# Patient Record
Sex: Female | Born: 1960 | Race: White | Hispanic: No | Marital: Married | Smoking: Never smoker
Health system: Southern US, Community
[De-identification: ages and names within clinical notes are randomized; demographics above are authoritative.]

## PROBLEM LIST (undated history)

## (undated) DIAGNOSIS — I471 Supraventricular tachycardia, unspecified: Secondary | ICD-10-CM

## (undated) DIAGNOSIS — I10 Essential (primary) hypertension: Secondary | ICD-10-CM

## (undated) DIAGNOSIS — G43909 Migraine, unspecified, not intractable, without status migrainosus: Secondary | ICD-10-CM

## (undated) DIAGNOSIS — R51 Headache: Secondary | ICD-10-CM

## (undated) DIAGNOSIS — K851 Biliary acute pancreatitis without necrosis or infection: Secondary | ICD-10-CM

## (undated) DIAGNOSIS — N3000 Acute cystitis without hematuria: Secondary | ICD-10-CM

## (undated) DIAGNOSIS — J189 Pneumonia, unspecified organism: Secondary | ICD-10-CM

## (undated) DIAGNOSIS — K219 Gastro-esophageal reflux disease without esophagitis: Secondary | ICD-10-CM

## (undated) DIAGNOSIS — D509 Iron deficiency anemia, unspecified: Secondary | ICD-10-CM

## (undated) HISTORY — PX: OOPHORECTOMY: SHX86

## (undated) HISTORY — DX: Supraventricular tachycardia: I47.1

## (undated) HISTORY — DX: Supraventricular tachycardia, unspecified: I47.10

## (undated) HISTORY — DX: Acute cystitis without hematuria: N30.00

## (undated) HISTORY — DX: Headache: R51

## (undated) HISTORY — DX: Iron deficiency anemia, unspecified: D50.9

## (undated) HISTORY — DX: Biliary acute pancreatitis without necrosis or infection: K85.10

## (undated) HISTORY — PX: BACK SURGERY: SHX140

---

## 1998-05-11 ENCOUNTER — Other Ambulatory Visit: Admission: RE | Admit: 1998-05-11 | Discharge: 1998-05-11 | Payer: Self-pay | Admitting: Obstetrics and Gynecology

## 1999-07-31 ENCOUNTER — Other Ambulatory Visit: Admission: RE | Admit: 1999-07-31 | Discharge: 1999-07-31 | Payer: Self-pay | Admitting: Obstetrics and Gynecology

## 2001-03-04 ENCOUNTER — Emergency Department (HOSPITAL_COMMUNITY): Admission: EM | Admit: 2001-03-04 | Discharge: 2001-03-04 | Payer: Self-pay | Admitting: Emergency Medicine

## 2001-03-19 ENCOUNTER — Other Ambulatory Visit: Admission: RE | Admit: 2001-03-19 | Discharge: 2001-03-19 | Payer: Self-pay | Admitting: Internal Medicine

## 2001-05-18 ENCOUNTER — Encounter: Payer: Self-pay | Admitting: Internal Medicine

## 2005-02-27 ENCOUNTER — Ambulatory Visit: Payer: Self-pay | Admitting: Internal Medicine

## 2005-03-10 ENCOUNTER — Ambulatory Visit: Payer: Self-pay | Admitting: Internal Medicine

## 2006-02-12 ENCOUNTER — Emergency Department (HOSPITAL_COMMUNITY): Admission: EM | Admit: 2006-02-12 | Discharge: 2006-02-12 | Payer: Self-pay | Admitting: Emergency Medicine

## 2006-08-18 HISTORY — PX: ANTERIOR CERVICAL DECOMP/DISCECTOMY FUSION: SHX1161

## 2006-11-13 ENCOUNTER — Ambulatory Visit: Payer: Self-pay | Admitting: Internal Medicine

## 2006-11-13 LAB — CONVERTED CEMR LAB
ALT: 17 units/L (ref 0–40)
Bilirubin, Direct: 0.1 mg/dL (ref 0.0–0.3)
Calcium: 8.3 mg/dL — ABNORMAL LOW (ref 8.4–10.5)
Cholesterol: 175 mg/dL (ref 0–200)
Eosinophils Absolute: 0.2 10*3/uL (ref 0.0–0.6)
Eosinophils Relative: 2.4 % (ref 0.0–5.0)
GFR calc Af Amer: 140 mL/min
GFR calc non Af Amer: 116 mL/min
Glucose, Bld: 86 mg/dL (ref 70–99)
HDL: 46.6 mg/dL (ref >39.0)
Lymphocytes Relative: 21.2 % (ref 12.0–46.0)
MCHC: 33.7 g/dL (ref 30.0–36.0)
MCV: 78.8 fL (ref 78.0–100.0)
Neutro Abs: 7.5 10*3/uL (ref 1.4–7.7)
Platelets: 549 10*3/uL — ABNORMAL HIGH (ref 150–400)
Sodium: 141 meq/L (ref 135–145)
TSH: 1.1 microintl units/mL (ref 0.35–5.50)
Triglycerides: 124 mg/dL (ref 0–149)
WBC: 10.4 10*3/uL (ref 4.5–10.5)

## 2006-11-20 ENCOUNTER — Ambulatory Visit: Payer: Self-pay | Admitting: Internal Medicine

## 2007-02-05 ENCOUNTER — Encounter (INDEPENDENT_AMBULATORY_CARE_PROVIDER_SITE_OTHER): Payer: Self-pay | Admitting: Obstetrics and Gynecology

## 2007-02-05 ENCOUNTER — Ambulatory Visit (HOSPITAL_COMMUNITY): Admission: RE | Admit: 2007-02-05 | Discharge: 2007-02-06 | Payer: Self-pay | Admitting: Obstetrics and Gynecology

## 2007-02-23 ENCOUNTER — Ambulatory Visit: Payer: Self-pay | Admitting: Internal Medicine

## 2007-02-23 LAB — CONVERTED CEMR LAB
Basophils Relative: 0.9 % (ref 0.0–1.0)
Eosinophils Relative: 4.2 % (ref 0.0–5.0)
HCT: 35.4 % — ABNORMAL LOW (ref 36.0–46.0)
Hemoglobin: 12.4 g/dL (ref 12.0–15.0)
Monocytes Absolute: 0.4 10*3/uL (ref 0.2–0.7)
Neutrophils Relative %: 64.7 % (ref 43.0–77.0)
RDW: 13.4 % (ref 11.5–14.6)

## 2007-02-24 ENCOUNTER — Encounter: Payer: Self-pay | Admitting: Internal Medicine

## 2007-03-02 ENCOUNTER — Ambulatory Visit: Payer: Self-pay | Admitting: Internal Medicine

## 2008-01-17 ENCOUNTER — Telehealth: Payer: Self-pay | Admitting: *Deleted

## 2008-05-01 ENCOUNTER — Ambulatory Visit: Payer: Self-pay | Admitting: Internal Medicine

## 2008-05-01 LAB — CONVERTED CEMR LAB
AST: 18 units/L (ref 0–37)
Albumin: 4 g/dL (ref 3.5–5.2)
Basophils Absolute: 0.1 10*3/uL (ref 0.0–0.1)
Basophils Relative: 0.8 % (ref 0.0–3.0)
Bilirubin Urine: NEGATIVE
Blood in Urine, dipstick: NEGATIVE
Calcium: 8.8 mg/dL (ref 8.4–10.5)
Chloride: 109 meq/L (ref 96–112)
Cholesterol: 168 mg/dL (ref 0–200)
Creatinine, Ser: 0.6 mg/dL (ref 0.4–1.2)
Eosinophils Absolute: 0.3 10*3/uL (ref 0.0–0.7)
GFR calc Af Amer: 139 mL/min
GFR calc non Af Amer: 115 mL/min
Glucose, Urine, Semiquant: NEGATIVE
HCT: 38 % (ref 36.0–46.0)
HDL: 35.7 mg/dL — ABNORMAL LOW (ref >39.0)
MCHC: 33.8 g/dL (ref 30.0–36.0)
MCV: 82.7 fL (ref 78.0–100.0)
Monocytes Absolute: 0.4 10*3/uL (ref 0.1–1.0)
Neutrophils Relative %: 66.7 % (ref 43.0–77.0)
Protein, U semiquant: NEGATIVE
RBC: 4.6 M/uL (ref 3.87–5.11)
TSH: 0.78 microintl units/mL (ref 0.35–5.50)
Total Bilirubin: 1 mg/dL (ref 0.3–1.2)
VLDL: 23 mg/dL (ref 0–40)
pH: 7

## 2008-05-08 ENCOUNTER — Ambulatory Visit: Payer: Self-pay | Admitting: Internal Medicine

## 2008-05-08 DIAGNOSIS — D473 Essential (hemorrhagic) thrombocythemia: Secondary | ICD-10-CM

## 2008-05-08 DIAGNOSIS — G478 Other sleep disorders: Secondary | ICD-10-CM | POA: Insufficient documentation

## 2008-05-08 DIAGNOSIS — D75839 Thrombocytosis, unspecified: Secondary | ICD-10-CM | POA: Insufficient documentation

## 2008-05-08 DIAGNOSIS — R5383 Other fatigue: Secondary | ICD-10-CM

## 2008-05-08 DIAGNOSIS — R51 Headache: Secondary | ICD-10-CM | POA: Insufficient documentation

## 2008-05-08 DIAGNOSIS — J45909 Unspecified asthma, uncomplicated: Secondary | ICD-10-CM | POA: Insufficient documentation

## 2008-05-08 DIAGNOSIS — R5381 Other malaise: Secondary | ICD-10-CM | POA: Insufficient documentation

## 2008-05-08 DIAGNOSIS — E669 Obesity, unspecified: Secondary | ICD-10-CM | POA: Insufficient documentation

## 2008-05-08 DIAGNOSIS — R519 Headache, unspecified: Secondary | ICD-10-CM | POA: Insufficient documentation

## 2008-05-13 DIAGNOSIS — J309 Allergic rhinitis, unspecified: Secondary | ICD-10-CM | POA: Insufficient documentation

## 2008-08-18 HISTORY — PX: VAGINAL HYSTERECTOMY: SUR661

## 2008-12-21 ENCOUNTER — Ambulatory Visit: Payer: Self-pay | Admitting: Internal Medicine

## 2009-01-04 ENCOUNTER — Telehealth: Payer: Self-pay | Admitting: Internal Medicine

## 2009-01-24 ENCOUNTER — Ambulatory Visit (HOSPITAL_COMMUNITY): Admission: RE | Admit: 2009-01-24 | Discharge: 2009-01-25 | Payer: Self-pay | Admitting: Orthopaedic Surgery

## 2009-04-27 ENCOUNTER — Ambulatory Visit: Payer: Self-pay | Admitting: Internal Medicine

## 2009-05-17 ENCOUNTER — Ambulatory Visit: Payer: Self-pay | Admitting: Internal Medicine

## 2009-05-17 LAB — CONVERTED CEMR LAB
Bilirubin Urine: NEGATIVE
Blood in Urine, dipstick: NEGATIVE
Glucose, Urine, Semiquant: NEGATIVE
Ketones, urine, test strip: NEGATIVE
Specific Gravity, Urine: 1.02
pH: 5.5

## 2009-05-25 ENCOUNTER — Ambulatory Visit: Payer: Self-pay | Admitting: Internal Medicine

## 2009-05-25 DIAGNOSIS — R300 Dysuria: Secondary | ICD-10-CM | POA: Insufficient documentation

## 2009-05-25 DIAGNOSIS — N3000 Acute cystitis without hematuria: Secondary | ICD-10-CM

## 2009-05-25 HISTORY — DX: Acute cystitis without hematuria: N30.00

## 2009-05-25 LAB — CONVERTED CEMR LAB
ALT: 17 units/L (ref 0–35)
Basophils Absolute: 0.1 10*3/uL (ref 0.0–0.1)
Basophils Relative: 0.5 % (ref 0.0–3.0)
Bilirubin Urine: NEGATIVE
Bilirubin, Direct: 0.1 mg/dL (ref 0.0–0.3)
CO2: 30 meq/L (ref 19–32)
Calcium: 8.5 mg/dL (ref 8.4–10.5)
Chloride: 103 meq/L (ref 96–112)
Cholesterol: 163 mg/dL (ref 0–200)
Creatinine, Ser: 0.6 mg/dL (ref 0.4–1.2)
Eosinophils Absolute: 0.4 10*3/uL (ref 0.0–0.7)
Glucose, Bld: 93 mg/dL (ref 70–99)
Glucose, Urine, Semiquant: NEGATIVE
HDL: 34.8 mg/dL — ABNORMAL LOW (ref >39.00)
Lymphocytes Relative: 19.9 % (ref 12.0–46.0)
MCHC: 34.5 g/dL (ref 30.0–36.0)
MCV: 81.6 fL (ref 78.0–100.0)
Monocytes Absolute: 0.5 10*3/uL (ref 0.1–1.0)
Neutro Abs: 7.3 10*3/uL (ref 1.4–7.7)
Neutrophils Relative %: 70.7 % (ref 43.0–77.0)
RBC: 4.64 M/uL (ref 3.87–5.11)
RDW: 13.5 % (ref 11.5–14.6)
Specific Gravity, Urine: >=1.03
Total Protein: 7.1 g/dL (ref 6.0–8.3)
Triglycerides: 145 mg/dL (ref 0.0–149.0)
pH: 5.5

## 2009-05-26 ENCOUNTER — Encounter: Payer: Self-pay | Admitting: Internal Medicine

## 2009-06-11 ENCOUNTER — Encounter: Payer: Self-pay | Admitting: *Deleted

## 2009-08-31 ENCOUNTER — Telehealth: Payer: Self-pay | Admitting: *Deleted

## 2010-09-13 ENCOUNTER — Encounter: Payer: Self-pay | Admitting: *Deleted

## 2010-09-17 NOTE — Progress Notes (Signed)
Summary: refill on rx sent to [facility]  Phone Note From Pharmacy   Caller: [facility] Reason for Call: Needs renewal Details for Reason: metoprolol, singulair and advair Initial call taken by: Romualdo Bolk, CMA (AAMA),  August 31, 2009 4:00 PM  Follow-up for Phone Call        Pt has never used [facility] before only [facility]. I tried to call pt to verify that she wants [place] to send her rx's to [facility] but her home number was a fax.  Follow-up by: Romualdo Bolk, CMA Duncan Dull),  August 31, 2009 4:03 PM  Additional Follow-up for Phone Call Additional follow up Details #1::        LMTOCB Additional Follow-up by: Romualdo Bolk, CMA Duncan Dull),  September 05, 2009 10:49 AM    Additional Follow-up for Phone Call Additional follow up Details #2::    Pt called back saying that we could go ahead and sent rx's to [facility]. Rx's sent. Follow-up by: Romualdo Bolk, CMA (AAMA),  September 07, 2009 10:13 AM  Prescriptions: ADVAIR DISKUS 100-50 MCG/DOSE  MISC (FLUTICASONE-SALMETEROL) 1 puff two times a day or as directed  #3 x 3   Entered by:   Romualdo Bolk, CMA (AAMA)   Authorized by:   Madelin Headings MD   Signed by:   Romualdo Bolk, CMA (AAMA) on 09/07/2009   Method used:   Electronically to        [facility] (mail-order)             ,          Ph: [phone]       Fax: [phone]   RxID:   [id number] TOPROL XL 50 MG  TB24 (METOPROLOL SUCCINATE) 1 by mouth once daily.  #90 x 3   Entered by:   Romualdo Bolk, CMA (AAMA)   Authorized by:   Madelin Headings MD   Signed by:   Romualdo Bolk, CMA (AAMA) on 09/07/2009   Method used:   Electronically to        [facility] (mail-order)             ,          Ph: [phone]       Fax: [phone]   RxID:   [id number] SINGULAIR 10 MG  TABS (MONTELUKAST SODIUM) 1 by mouth once daily.  #90 x 3   Entered by:   Romualdo Bolk, CMA (AAMA)   Authorized by:   Madelin Headings MD   Signed by:   Romualdo Bolk, CMA  (AAMA) on 09/07/2009   Method used:   Electronically to        [facility]* (mail-order)             ,          Ph: [phone]       Fax: [phone]   RxID:   [id number]

## 2010-09-19 NOTE — Letter (Signed)
Summary: Generic Letter  [place] [facility]  [address] [place], [place] [address]   Phone: [phone]  Fax: [phone]    09/13/2010  Danielle Martin [address] [place], [place]  [address]  Dear [name],  We recieved refill request for your medication. In order to insure proper medical care, you are due for a physical appointment. Please give our office a call at [phone] to schedule this appointment before your next refill.          Sincerely,   Tor Netters, CMA (AAMA)

## 2010-11-07 ENCOUNTER — Other Ambulatory Visit: Payer: Self-pay | Admitting: Internal Medicine

## 2010-11-25 LAB — DIFFERENTIAL
Eosinophils Absolute: 0.2 10*3/uL (ref 0.0–0.7)
Lymphs Abs: 2.1 10*3/uL (ref 0.7–4.0)
Monocytes Absolute: 0.5 10*3/uL (ref 0.1–1.0)
Monocytes Relative: 5 % (ref 3–12)
Neutrophils Relative %: 67 % (ref 43–77)

## 2010-11-25 LAB — URINALYSIS, ROUTINE W REFLEX MICROSCOPIC
Hgb urine dipstick: NEGATIVE
Nitrite: NEGATIVE
Specific Gravity, Urine: 1.022 (ref 1.005–1.030)
Urobilinogen, UA: 0.2 mg/dL (ref 0.0–1.0)
pH: 5.5 (ref 5.0–8.0)

## 2010-11-25 LAB — BASIC METABOLIC PANEL
Calcium: 8.9 mg/dL (ref 8.4–10.5)
Creatinine, Ser: 0.59 mg/dL (ref 0.4–1.2)
GFR calc Af Amer: >60 mL/min (ref >60)
GFR calc non Af Amer: >60 mL/min (ref >60)
Sodium: 141 mEq/L (ref 135–145)

## 2010-11-25 LAB — URINE MICROSCOPIC-ADD ON

## 2010-11-25 LAB — CBC
HCT: 41.9 % (ref 36.0–46.0)
Hemoglobin: 13.1 g/dL (ref 12.0–15.0)
Hemoglobin: 14 g/dL (ref 12.0–15.0)
MCHC: 33.4 g/dL (ref 30.0–36.0)
MCHC: 34.6 g/dL (ref 30.0–36.0)
MCV: 82.2 fL (ref 78.0–100.0)
RBC: 4.58 MIL/uL (ref 3.87–5.11)
RBC: 5.09 MIL/uL (ref 3.87–5.11)
WBC: 7.4 10*3/uL (ref 4.0–10.5)
WBC: 8.7 10*3/uL (ref 4.0–10.5)

## 2010-11-25 LAB — PROTIME-INR: INR: 1 (ref 0.00–1.49)

## 2010-12-09 ENCOUNTER — Other Ambulatory Visit: Payer: Self-pay | Admitting: Internal Medicine

## 2010-12-18 ENCOUNTER — Other Ambulatory Visit (INDEPENDENT_AMBULATORY_CARE_PROVIDER_SITE_OTHER): Payer: BC Managed Care – PPO | Admitting: Internal Medicine

## 2010-12-18 DIAGNOSIS — Z0000 Encounter for general adult medical examination without abnormal findings: Secondary | ICD-10-CM

## 2010-12-18 LAB — TSH: TSH: 0.83 u[IU]/mL (ref 0.35–5.50)

## 2010-12-18 LAB — BASIC METABOLIC PANEL
CO2: 31 mEq/L (ref 19–32)
Calcium: 8.9 mg/dL (ref 8.4–10.5)
Creatinine, Ser: 0.7 mg/dL (ref 0.4–1.2)
Glucose, Bld: 91 mg/dL (ref 70–99)

## 2010-12-18 LAB — POCT URINALYSIS DIPSTICK
Bilirubin, UA: NEGATIVE
Blood, UA: NEGATIVE
Glucose, UA: NEGATIVE
Nitrite, UA: NEGATIVE
Spec Grav, UA: 1.02
Urobilinogen, UA: 0.2

## 2010-12-18 LAB — LIPID PANEL
Cholesterol: 183 mg/dL (ref 0–200)
LDL Cholesterol: 113 mg/dL — ABNORMAL HIGH (ref 0–99)
Triglycerides: 152 mg/dL — ABNORMAL HIGH (ref 0.0–149.0)

## 2010-12-18 LAB — HEPATIC FUNCTION PANEL
ALT: 18 U/L (ref 0–35)
AST: 17 U/L (ref 0–37)
Total Protein: 7 g/dL (ref 6.0–8.3)

## 2010-12-18 LAB — CBC WITH DIFFERENTIAL/PLATELET
Basophils Relative: 0.6 % (ref 0.0–3.0)
Eosinophils Relative: 3 % (ref 0.0–5.0)
Lymphs Abs: 1.7 10*3/uL (ref 0.7–4.0)
MCHC: 33.4 g/dL (ref 30.0–36.0)
Monocytes Absolute: 0.4 10*3/uL (ref 0.1–1.0)
Monocytes Relative: 5.3 % (ref 3.0–12.0)
Neutro Abs: 5.7 10*3/uL (ref 1.4–7.7)
RDW: 14.4 % (ref 11.5–14.6)

## 2010-12-24 ENCOUNTER — Encounter: Payer: Self-pay | Admitting: Internal Medicine

## 2010-12-25 ENCOUNTER — Encounter: Payer: Self-pay | Admitting: Internal Medicine

## 2010-12-25 ENCOUNTER — Ambulatory Visit (INDEPENDENT_AMBULATORY_CARE_PROVIDER_SITE_OTHER): Payer: BC Managed Care – PPO | Admitting: Internal Medicine

## 2010-12-25 VITALS — BP 140/80 | HR 72 | Ht 64.0 in | Wt 227.0 lb

## 2010-12-25 DIAGNOSIS — E669 Obesity, unspecified: Secondary | ICD-10-CM

## 2010-12-25 DIAGNOSIS — D759 Disease of blood and blood-forming organs, unspecified: Secondary | ICD-10-CM

## 2010-12-25 DIAGNOSIS — I10 Essential (primary) hypertension: Secondary | ICD-10-CM

## 2010-12-25 DIAGNOSIS — J45909 Unspecified asthma, uncomplicated: Secondary | ICD-10-CM

## 2010-12-25 DIAGNOSIS — Z23 Encounter for immunization: Secondary | ICD-10-CM

## 2010-12-25 DIAGNOSIS — G478 Other sleep disorders: Secondary | ICD-10-CM

## 2010-12-25 DIAGNOSIS — Z0000 Encounter for general adult medical examination without abnormal findings: Secondary | ICD-10-CM

## 2010-12-25 DIAGNOSIS — J309 Allergic rhinitis, unspecified: Secondary | ICD-10-CM

## 2010-12-25 LAB — HM MAMMOGRAPHY

## 2010-12-25 MED ORDER — METOPROLOL SUCCINATE ER 50 MG PO TB24: 50.0000 mg | ORAL_TABLET | Freq: Every day | ORAL | Status: DC

## 2010-12-25 NOTE — Progress Notes (Signed)
Subjective:    Patient ID: Danielle Martin, female    DOB: 02-Nov-1960, 50 y.o.   MRN: [id number]  HPI Patient comes in today for preventive visit and followup of her medical issues.  No major change in health status since last visit .  Allergy asthma no flares except after mowing grass.  Problems falling asleep and hard to go back. Began ret exercise  12 miles qod   And elliptical.  She is to see her OB/GYN this week for a Pap smear and mammogram Review of Systems Sleep 5-6 hours  Of sleep.  No OSA sx   no restless leg Knee bothers her at times . Tried glucosamine/chondroitin and made it worse so she stopped Asthma allergy stable on meds. New bleeding major change in vision hearing chest pain shortness of breath other changes in health. Rest as per history of present illness.   Past Medical History  Diagnosis Date  . Asthma     Worse in winter  . Allergic rhinitis     Worse in winter , grasses  . Headache   . SVT (supraventricular tachycardia)     No longer a problem   Past Surgical History  Procedure Date  . Cesarean section     x 2  . Oophorectomy     right  . Cervical spine surgery     reports that she has never smoked. She does not have any smokeless tobacco history on file. She reports that she drinks alcohol. Her drug history not on file. family history includes Other in her mother and Prostate cancer in her father. Allergies  Allergen Reactions  . Codeine Anaphylaxis    Hallucinations       Objective:   Physical Exam Physical Exam: Vital signs reviewed BJY:NWGN is a well-developed well-nourished alert cooperative  white female who appears her stated age in no acute distress.  HEENT: normocephalic  traumatic , Eyes: PERRL EOM's full, conjunctiva clear, wears glasses Nares: paten,t no deformity discharge or tenderness., Ears: no deformity EAC's clear TMs with normal landmarks. Mouth: clear OP, no lesions, edema.  Moist mucous membranes. Dentition in adequate  repair. NECK: supple without masses, thyromegaly or bruits. CHEST/PULM:  Clear to auscultation and percussion breath sounds equal no wheeze , rales or rhonchi. No chest wall deformities or tenderness. Breast: normal by inspection . No dimpling, discharge, masses, tenderness or discharge . CV: PMI is nondisplaced, S1 S2 no gallops, murmurs, rubs. Peripheral pulses are full without delay.No JVD .  Repeat blood pressure was 138/82 right arm large cuff ABDOMEN: Bowel sounds normal nontender  No guard or rebound, no hepato splenomegal no CVA tenderness.  No hernia. Extremtities:  No clubbing cyanosis or edema, no acute joint swelling or redness no focal atrophy NEURO:  Oriented x3, cranial nerves 3-12 appear to be intact, no obvious focal weakness,gait within normal limits no abnormal reflexes or asymmetrical SKIN: No acute rashes normal turgor, color, no bruising or petechiae. PSYCH: Oriented, good eye contact, no obvious depression anxiety, cognition and judgment appear normal. Laboratory studies reviewed slightly elevated potassium at 5.2 normal renal function platelet count is normal rest of labs normal EKG shows normal sinus rhythm with nonspecific T wave change within normal limits       Assessment & Plan:  Preventive Health Care UTD   TDAP today  To get [pap and mammo .  Potassium of 5.2 is probably lab drawing affect .Counseled regarding healthy nutrition, exercise, sleep, injury prevention, calcium vit d and healthy  weight . HT: Him reasonably adequate control. Discussed other options but since she is doing well and no significant side effects will remain on her Toprol-XL for the next year. She will call us if control is a factor but improvement in lifestyle interventions may be more helpful. Obesity Thrombocytosis   resolved on the CBC no history of clotting Allergy asthma  stable take Singulair year round. Sleep: Stable discussed  techniques interventions at present will continue. No  evidence of disease

## 2010-12-25 NOTE — Patient Instructions (Addendum)
Continue lifestyle intervention healthy eating and exercise .  Weight loss will help you medical issues. Consider calendaring  Intake and exercise  Check up and labs in a year. conctact [place] if BP readings not in control

## 2010-12-30 ENCOUNTER — Other Ambulatory Visit: Payer: Self-pay | Admitting: Internal Medicine

## 2010-12-31 NOTE — Op Note (Signed)
NAMELYLIA, KARN NO.:  [id number]   MEDICAL RECORD NO.:  [id number]          PATIENT TYPE:  OIB   LOCATION:  9302                          FACILITY:  [facility]   PHYSICIAN:  Juluis Mire, M.D.   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   OPERATIVE PROCEDURE:  Open laparoscopy with laparoscopic assisted  vaginal hysterectomy.  Lysis of adhesions.  Cystoscopy.   SURGEON:  Juluis Mire, M.D.   ASSISTANT:  Zelphia Cairo, M.D.   ESTIMATED BLOOD LOSS:  400-500 mL.   PACKS AND DRAINS:  None.   BLOOD REPLACED:  None.   COMPLICATIONS:  None.   INDICATIONS:  Dictated history and physical.   DESCRIPTION OF PROCEDURE:  The patient was taken to OR, placed supine  position.  After satisfactory level of general endotracheal anesthesia  obtained, the patient was placed in dorsal supine position using the  Allen stirrups.  At this point time the abdomen, perineum, vagina  prepped out Betadine.  The bladder was emptied by in-and-out  catheterization.  A Hulka tenaculum was put in place and secured.  Patient draped sterile field.  Subumbilical incision made knife carried  through subcutaneous tissue.  Fascia was entered sharply and incision to  fascia extended laterally.  Muscles were separated midline.  Peritoneum  was picked up and entered sharply.  A open laparoscopic trocar was put  in place and secured.  Laparoscope was introduced and abdomen  insufflated with carbon dioxide.  There was no evidence of injury to  adjacent organs.  The appendix was visualized noted be normal. Upper  abdomen including liver and tip the gallbladder were clear. In the  pelvis the right ovary was surgically absent.  The left ovary  was  adherent to the left pelvic sidewall and to the back the uterus.  A 5-mm  trocar was put in place in suprapubic area.  Were able to dissect the  ovary off the back of uterus.  Then we brought the gyrus in place and  next the left round ligament was cauterized incised. Left tube and  mesosalpinx were cauterized incised and the left utero-ovarian pedicle  was cauterized and incised.  We were then went to the right side where  basically we just had the round ligament and this was cauterized and  incised.  We continued cautery and incision down to the broad ligament  next the side of the uterus to better facilitate vaginal removal. At  this point time we had fairly good hemostasis.  The abdomen deflated  carbon dioxide.  Laparoscope was removed.   The Hulka tenaculum was then removed.  The patient's legs repositioned.  Weighted speculum placed in vaginal vault.  Cervix was grasped with  Christella Hartigan tenaculum.  Cul-de-sac was entered sharply.  Both uterosacral  ligaments were clamped, cut, suture ligated with 0 Vicryl.  Reflection  of vaginal mucosa was incised using the Bovie and bladder was dissected  superiorly.  Paracervical tissue was clamped, cut and suture ligated 0  Vicryl.  Vesicouterine space identified and entered sharply.  Retractors  put in place to retract the bladder superiorly. Using the clamp, cut and  tie technique with suture ligature of 0 Vicryl.  The parametrium was  serially separated from sides the uterus.  Uterus was then flipped.  There was no additional pedicles remaining.  We did have some oozing  from the vaginal cuff brought under control with figure-of-eights of 0  Vicryl.  At this point time she had such excellent support we did not do  uterosacral plication stitch.  The vaginal mucosa was reapproximated in  vertical fashion with interrupted figure-of-eights of 0 Monocryl.  We  actually had some vaginal lacerations from retracting and we sutured  over these with  the 0 Monocryl and used the Bovie for hemostasis.   Due to the adhesions of the left ovary, we decided performed cystoscopy  to rule out ureteric injury.  The patient was given indigo carmine,  cystoscope was brought in place.  The scope was inserted in the bladder  and the bladder was distended using irrigant.  Visualization revealed  both ureteral orifices were identified. Actually even before the balloon  could come through, we could see urine spilling from both ureteral  orifices in abundance.  There is no evidence of bladder lacerations.  The scope was then removed, Foley was placed straight drain.  We  visualized the vaginal cuff and it was hemostatically intact.   The patient's legs repositioned.  Abdomen reinflated carbon dioxide.  Laparoscope was reintroduced and visualization revealed some bleeding  from the vaginal cuff. We put in the second 5-mm trocar in the left  lower quadrant after visualization of the epigastric vessels.  This was  used to bring the bowel out of the pelvic cavity.  We then used the  gyrus to bring about hemostasis at the vaginal cuff.  We had good  hemostasis.  We thoroughly irrigated the pelvis and there was no active  bleeding.  We deflated, we visualized and no active bleeding was noted.  At this point time the abdomen was deflated carbon dioxide.  All trocars  removed.  Subumbilical fascia closed figure-of-eights of 0 Vicryl.  Skin  with interrupted subcuticulars of 4-0 Vicryl.  The suprapubic incisions  were closed with Dermabond.  The patient taken out of dorsal lithotomy  position.  Once alert, extubated, transferred recovery room good  condition.  Sponge, instrument, needle count was correct by circulating  nurse x2.  Foley catheter remained clear at time of closure.  The  patient tolerated procedure well and was returned to recovery room in  good position.      Juluis Mire, M.D.  Electronically Signed     JSM/MEDQ  D:  02/05/2007  T:   02/05/2007  Job:  [id number]

## 2010-12-31 NOTE — H&P (Signed)
NAME:  Danielle Martin, Danielle Martin NO.:  [id number]   MEDICAL RECORD NO.:  [id number]          PATIENT TYPE:  AMB   LOCATION:  SDC                           [facility]ACILITY:  [facility]   PHYSICIAN:  Juluis Mire, M.D.   DATE OF BIRTH:  [birth year]   DATE OF ADMISSION:  02/05/2007  DATE OF DISCHARGE:                              HISTORY & PHYSICAL   The patient is a 45-year gravida 2, para 2 female who presents for  laparoscopic-assisted vaginal hysterectomy.   In relation to the present admission, the patient has been followed by  Dr. Fabian Sharp for routine health maintenance.  She has noted that her  cycles have become increasingly heavy and was referred in for abnormal  bleeding.  Right now, her periods have been lasting for approximately 3  weeks.  She has severe cramping with large clots and heavy flow.  Her  hemoglobin has remained stable.  Saline infusion ultrasound was highly  suggestive of adenomyosis with no other abnormalities noted.  We  discussed options including birth control pills versus IUD versus  ablation versus hysterectomy.  The patient was admitted at the present  time to undergo laparoscopic-assisted vaginal hysterectomy.  All Pap  smears through Dr. Fabian Sharp have been normal.  Husband has had prior  vasectomy.   In terms of allergies, she has no known drug allergies.   MEDICATIONS:  Are none.   PAST MEDICAL HISTORY:  Usual childhood disease.  No significant  sequelae.   PAST SURGICAL HISTORY:  She had in '89 a left hysterectomy and a partial  right hysterectomy due to cysts.  This was in [place].  She has had two  cesarean sections.   FAMILY HISTORY:  Noncontributory.   SOCIAL HISTORY:  Reveals no tobacco or alcohol use.   REVIEW OF SYSTEMS:  Noncontributory.   PHYSICAL EXAMINATION:  The patient is afebrile, stable vital signs.  HEENT EXAM:  The patient is normocephalic.  Pupils equal, round and  reactive to light and accommodation.  Extraocular movements  intact.  Sclerae and conjunctivae clear.  Oropharynx clear.  NECK:  Without thyromegaly.  BREASTS:  Not examined.  LUNGS:  Clear.  CARDIOVASCULAR SYSTEM:  Regular rate.  No murmurs or gallops.  ABDOMINAL EXAM: Is benign.  No mass, organomegaly or tenderness.  PELVIC:  Normal external genitalia.  Vaginal mucosa is clear.  Cervix  unremarkable.  Uterus normal size, shape and contour.  Adnexa free of  masses or tenderness.  EXTREMITIES:  Trace edema.  NEUROLOGIC EXAM:  Grossly within normal limits.   IMPRESSION:  Menorrhagia secondary to adenomyosis.   PLAN:  The patient will undergo laparoscopic assisted vaginal  hysterectomy.  Other options have been discussed.  The risk of surgery  explained including the risk of infection.  The risk of hemorrhage that  could require transfusion, the risk of AIDS or hepatitis.  The risk of  injury to adjacent organs including bladder, bowel, or ureters that  could require further exploratory surgery.  Risk of deep venous  thrombosis and pulmonary emboli.  The patient expressed understanding of  indications  and risks and other alternatives.      Juluis Mire, M.D.  Electronically Signed     JSM/MEDQ  D:  02/05/2007  T:  02/05/2007  Job:  [id number]

## 2010-12-31 NOTE — Discharge Summary (Signed)
NAMEAMIYRAH, Danielle Martin NO.:  [id number]   MEDICAL RECORD NO.:  [id number]          PATIENT TYPE:  OIB   LOCATION:  9302                          [facility]ILITY[facility]   PHYSICIAN:  Juluis Mire, M.D.   DATE OF BIRTH:  November 04, 1960   DATE OF ADMISSION:  02/05/2007  DATE OF DISCHARGE:  02/06/2007                               DISCHARGE SUMMARY   ADMISSION DIAGNOSIS:  Adenomyosis.   DISCHARGE DIAGNOSIS:  Adenomyosis plus adhesions.   PROCEDURE:  Laparoscopic-assisted vaginal hysterectomy and cystoscopy.  For complete history and physical, please see dictated note.   COURSE IN THE HOSPITAL:  The patient underwent above-noted surgery.  Her  right ovary was surgically absent.  Left ovary was adherent to the back  of the uterus and pelvic sidewall and had to be freed up prior to  proceeding with laparoscopic-assisted vaginal hysterectomy.  Due to the  dissection left side, cystoscopy was performed after the hysterectomy,  revealing both ureters to be functioning appropriately, and there was no  bladder injury.  On her first postoperative day, she was afebrile, with  stable vital signs.  Her abdomen was soft, nontender.  Bowel sounds were  active.  All incisions were clear.  She was voiding without difficulty.  Her postoperative hemoglobin was 11.2.  She was tolerating a regular  diet and was discharged home at that time.   In terms of complications, none were encountered during stay in  hospital.  The patient discharged home in stable condition.   DISPOSITION:  The patient is to avoid vaginal entrance, heavy lifting  and should be careful driving a car.  She is to watch for signs of  infection, nausea, vomiting, increasing abdominal pelvic pain or active  vaginal bleeding.  She is also instructed to watch for evidence of deep  venous thrombosis or pulmonary embolus.  Discharged home on Tylox as  needed for pain.      Juluis Mire, M.D.  Electronically Signed     JSM/MEDQ  D:  02/06/2007  T:  02/06/2007  Job:  [id number]

## 2010-12-31 NOTE — Op Note (Signed)
Danielle Martin, [name]                  ACCOUNT NO.:  [id number]   MEDICAL RECORD NO.:  [id number]          PATIENT TYPE:  OIB   LOCATION:  2550                         FACILITY:  [facility]   PHYSICIAN:  Mark C. Ophelia Charter, M.D.    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   PROCEDURE:  C4-5, C5-6, C6-7 anterior cervical diskectomy and fusion,  allograft and plate (three level).   SURGEON:  Mark C. Ophelia Charter, MD   ASSISTANT:  Wende Neighbors, PA   ANESTHESIA:  GOT.   ESTIMATED BLOOD LOSS:  Minimal.   DRAINS:  One Hemovac in neck with anesthesia put plus 7 mL Marcaine skin  local.   A 50 year old female with radiculopathy and large HNP with compression  and cord displacement on the left at C4-5 and also C6-7.  She had  preexisting disk herniation which turned out to be calcified on the  right at the C5-6 level.   PROCEDURE:  After standard prepping and draping, preoperative Ancef  prophylaxis, the patient was intubated.  The arms were tucked to the  sides with careful padding.  A horseshoe head holder was used.  Head  halter traction without weight prepping the neck with DuraPrep after  placement of a Foley catheter and restraints for intraop arm traction  when C-arm was being used for spot films.  Neck was prepped with  DuraPrep.  The area was squared with towels.  Sterile skin marker was  used for outline planned incision based on palpable landmarks and  Betadine Vi-drape application.  Transverse incision was planned and  sterile Mayo stand at the head, thyroid sheets and drapes.  Surgical  time-out checklist was completed.  Ancef had been given  prophylactically.  Incision was made starting at the midline, extending  to the left.  Platysma was divided in line with its fibers.  Considerable amount of time was taken undermining the platysma for  mobilization.  The patient had a short thick neck, 30-cm layer of  subcutaneous adipose tissue, and once the 3 large prominent spurs were  encountered extra-deep blades were used with the Cloward retractor, and  a short 25 needle was placed at the most cephalad level which was 3-4.  Most symptomatic level was 4-5, had spurs removed off the anterior  aspect which were trying to bridge over, and once the spurs had been  removed and the disk was exposed a short needle was placed back in it,  and repeat C-arm had been draped brought it was taken which confirmed  that this was the planned operative level at C4-5.  Diskectomy was  performed.  Operative microscope was draped and brought in.  Disk was  sequentially removed progressing back to the posterior longitudinal  ligament.  Large chunks of disk were removed.  Posterior longitudinal  ligament was completely taken down.  This material had been retained by  the ligament,  and once the ligament was totally exposed it was not  causing protrusion or compression.  Ligament was taken down, and with  the dura exposed passes were made underneath removing the spurs both  right and left.  Dura was intact after irrigation of fluid, touching up  of the bur and hand curettes, flattening the edges.  A rasp was used  followed by sizers, 7-mm graft was selected.  Traction was applied by  the CRNA.  Using the head halter, graft was countersunk a few  millimeters, checked under fluoro.  Self-retaining retractor with teeth  blades were placed underneath the longus colli next level down.  C5-6  was done next, and on this level on the right side the C5-6.  With the  operative microscope, there was what appeared be bone ridge and turned  out to be old calcified disk herniation which was a fragment not  attached to endplates and was calcified and contained part of the  posterior longitudinal  ligament in it.  Once it was removed, the dura  was decompressed.  On the left side, there was minimal compression, and  both uncovertebral joints were stripped with Cloward curettes and graft  was selected 6 mm and placed under traction, countersunk a few  millimeters with room both right and left for egress of fluid.  Dura was  intact, completely decompressed, and ligaments have been completely  removed.  At the bottom level of C6-7, there again was disk protrusion  which was present on the left, minimal on the right.  No calcified disk  fragments were seen and uncovertebral joints were stripped carefully.  Graft initially did not want to sit down, was hanging up, and traction  was removed.  Some additional bone preparation was made removing the  little spurs on the corner where the graft was hanging up.  All anterior  spurs have been removed with the bur and Baer hand rongeur, so once the  final graft was placed in good position, checked under fluoroscopy, the  [employer] VueLock plate was selected, placed.  The shortness of the 3-  level plates which was the 48-mm plate was selected after the 52  appeared too long.  It was checked under AP and lateral fluoroscopy and  then held with the prong.  With it in the midline, all screws were  placed 14 mm locked in with the locking rings.  Spinal spot pictures  were taken distally, oblique said to be used in order to visualize the  distal most screws.  Grafts were visualized in good position, and after  irrigation of saline solution Hemovac was placed in-and-out technique to  the left in line with the skin incision.  Platysma closed with 3-0  Vicryl, 4-0 Vicryl subcuticular closure, tincture of Benzoin, Steri-  Strips, Marcaine infiltration, postop dressing, soft cervical collar.  The patient was extubated, Foley was left in, and the patient was  transferred to the recovery room.      Mark C. Ophelia Charter, M.D.  Electronically Signed      MCY/MEDQ  D:  01/24/2009  T:  01/25/2009  Job:  [id number]

## 2011-02-08 ENCOUNTER — Other Ambulatory Visit: Payer: Self-pay | Admitting: Internal Medicine

## 2011-02-12 ENCOUNTER — Other Ambulatory Visit: Payer: Self-pay | Admitting: Obstetrics and Gynecology

## 2011-02-12 DIAGNOSIS — N63 Unspecified lump in unspecified breast: Secondary | ICD-10-CM

## 2011-02-18 ENCOUNTER — Ambulatory Visit
Admission: RE | Admit: 2011-02-18 | Discharge: 2011-02-18 | Disposition: A | Discharge status: Home / Self Care | Payer: BC Managed Care – PPO | Source: Ambulatory Visit | Attending: Obstetrics and Gynecology | Admitting: Obstetrics and Gynecology

## 2011-02-18 ENCOUNTER — Other Ambulatory Visit: Payer: Self-pay | Admitting: Obstetrics and Gynecology

## 2011-02-18 DIAGNOSIS — N63 Unspecified lump in unspecified breast: Secondary | ICD-10-CM

## 2011-03-06 ENCOUNTER — Encounter: Payer: Self-pay | Admitting: Internal Medicine

## 2011-06-04 LAB — CBC
HCT: 33.6 — ABNORMAL LOW
Hemoglobin: 11.2 — ABNORMAL LOW
MCHC: 33.3
MCHC: 33.5
MCV: 79.9
MCV: 80.7
Platelets: 555 — ABNORMAL HIGH
RDW: 13.9
WBC: 11.2 — ABNORMAL HIGH

## 2011-06-04 LAB — HCG, SERUM, QUALITATIVE: Preg, Serum: NEGATIVE

## 2011-08-06 ENCOUNTER — Ambulatory Visit (INDEPENDENT_AMBULATORY_CARE_PROVIDER_SITE_OTHER): Payer: BC Managed Care – PPO | Admitting: Family Medicine

## 2011-08-06 ENCOUNTER — Encounter: Payer: Self-pay | Admitting: Family Medicine

## 2011-08-06 VITALS — BP 130/90 | HR 89 | Temp 98.6°F | Wt 220.0 lb

## 2011-08-06 DIAGNOSIS — J019 Acute sinusitis, unspecified: Secondary | ICD-10-CM

## 2011-08-06 MED ORDER — AMOXICILLIN 500 MG PO TABS: 1000.0000 mg | ORAL_TABLET | Freq: Two times a day (BID) | ORAL | Status: AC

## 2011-08-06 NOTE — Progress Notes (Signed)
  Subjective:    Patient ID: Danielle Martin, female    DOB: 1961/07/07, 50 y.o.   MRN: [id number]  HPI 50 year old white female, nonsmoker, in with complaints of sinus drainage, nasal congestion, sinus pressure and pain that's been going on for 5 days and worsening. She can take an over-the-counter Coricidin HBP that has helped her symptoms. Sputum is thick, yellow to green, and sometimes hard. Patient denies any lightheadedness or dizziness, any fever, muscle aches, nausea, vomiting, palpitations or shortness of breath.   Review of Systems As stated above   Past Medical History  Diagnosis Date  . Asthma     Worse in winter  . Allergic rhinitis     Worse in winter , grasses  . Headache   . SVT (supraventricular tachycardia)     No longer a problem    History   Social History  . Marital Status: Married    Spouse Name: N/A    Number of Children: N/A  . Years of Education: N/A   Occupational History  . Not on file.   Social History Main Topics  . Smoking status: Never Smoker   . Smokeless tobacco: Not on file  . Alcohol Use: 0.0 oz/week    2-3 Glasses of wine per week  . Drug Use: Not on file  . Sexually Active: Not on file   Other Topics Concern  . Not on file   Social History Narrative   MarriedEmployed [employer]  38 hours a weekHH of 2 Husband has a pet pythonPets dogNo etsDrinks [employer]    Past Surgical History  Procedure Date  . Cesarean section     x 2  . Oophorectomy     right  . Cervical spine surgery     Family History  Problem Relation Age of Onset  . Prostate cancer Father   . Other Mother     Died of asphyxiation    Allergies  Allergen Reactions  . Codeine Anaphylaxis    Hallucinations    Current Outpatient Prescriptions on File Prior to Visit  Medication Sig Dispense Refill  . ADVAIR DISKUS 100-50 MCG/DOSE AEPB USE ONE INHALATION TWO TIMES A DAY OR AS DIRECTED  3 each  2  . albuterol (PROAIR HFA) 108 (90 BASE) MCG/ACT  inhaler Inhale 2 puffs into the lungs every 6 (six) hours as needed.        . metoprolol (TOPROL-XL) 50 MG 24 hr tablet Take 1 tablet (50 mg total) by mouth daily.  90 tablet  3  . montelukast (SINGULAIR) 10 MG tablet Take 10 mg by mouth at bedtime.        . metoprolol (TOPROL-XL) 50 MG 24 hr tablet TAKE 1 TABLET BY MOUTH EVERY DAY  30 tablet  0    BP 130/90  Pulse 89  Temp(Src) 98.6 F (37 C) (Oral)  Wt 220 lb (99.791 kg)chart Objective:   Physical Exam Constitutional: Alert and oriented in no acute distress ENT: Ears are clear bilaterally. Pharynx moderately red. Sinus tenderness to palpation of the frontal or maxillary sinuses. No lymphadenopathy. Lungs: Clear to auscultation Cardiac: Regular rate and rhythm Skin: Warm and dry, no cyanosis Psychiatric: Normal mood and affect       Assessment & Plan:  Assessment: acute sinusitis  Plan: amoxicillin 500 mg 2 capsules by mouth twice a day x10 days. Continue Coricidin HBP as directed. Call if symptoms worsen or persist and when necessary

## 2011-10-17 ENCOUNTER — Ambulatory Visit (INDEPENDENT_AMBULATORY_CARE_PROVIDER_SITE_OTHER): Payer: BC Managed Care – PPO | Admitting: Internal Medicine

## 2011-10-17 ENCOUNTER — Encounter: Payer: Self-pay | Admitting: Internal Medicine

## 2011-10-17 VITALS — BP 120/80 | HR 95 | Temp 98.7°F | Wt 218.0 lb

## 2011-10-17 DIAGNOSIS — R05 Cough: Secondary | ICD-10-CM

## 2011-10-17 DIAGNOSIS — J329 Chronic sinusitis, unspecified: Secondary | ICD-10-CM

## 2011-10-17 DIAGNOSIS — J069 Acute upper respiratory infection, unspecified: Secondary | ICD-10-CM

## 2011-10-17 DIAGNOSIS — J309 Allergic rhinitis, unspecified: Secondary | ICD-10-CM

## 2011-10-17 DIAGNOSIS — J45909 Unspecified asthma, uncomplicated: Secondary | ICD-10-CM

## 2011-10-17 DIAGNOSIS — R059 Cough, unspecified: Secondary | ICD-10-CM

## 2011-10-17 MED ORDER — FLUTICASONE PROPIONATE 50 MCG/ACT NA SUSP: 2.0000 | Freq: Every day | NASAL | Status: DC

## 2011-10-17 MED ORDER — AMOXICILLIN 500 MG PO CAPS: 1000.0000 mg | ORAL_CAPSULE | Freq: Two times a day (BID) | ORAL | Status: AC

## 2011-10-17 NOTE — Progress Notes (Signed)
  Subjective:    Patient ID: Danielle Martin, female    DOB: 1961-07-16, 51 y.o.   MRN: [id number]  HPI Patient comes in today for SDA for  new problem evaluation.  Scratchy throat  For 3 days and then    severe nasal congestion.   No fever . Thick and coughing   On plane in 3 days.   No fever.   using coridind hbp.  She is very concerned that will turn in to  a cough and get worse while on a week trip. She states that she gets sinus infections a few times a year and they are hard to resolve, she gets treatment.    Has asthma  And year round and seasonal allergies . Not on nasal steroids .    Review of Systems  Negative for fever chest pain shortness of breath ear pain at present. She does have a lot of sinus pressure. Thick nasal drainage. No nausea vomiting or diarrhea she has somewhat of a headache .    Past history family history social history reviewed in the electronic medical record.     Objective:   Physical Exam WDWN in NAD  quiet respirations; mildly congested  somewhat hoarse. Non toxic . HEENT: Normocephalic ;atraumatic , Eyes;  PERRL, EOMs  Full, lids and conjunctiva clear,,Ears: no deformities, canals nl, TM landmarks normal, Nose: no deformity or discharge but congested;face minimally tender Mouth : OP clear without lesion or edema . Neck: Supple without adenopathy or masses or bruits Chest:  Clear to A&P without wheezes rales or rhonchi CV:  S1-S2 no gallops or murmurs peripheral perfusion is normal Skin :nl perfusion and no acute rashes      Assessment & Plan:  Allergy underlying.  Acute uri  With sinus involvement .  Suggest saline decongestants  And add flonase .  If  persistent or progressive can add antibiotic but these don't usually help unless a week or so into illness.  However cause of airtrip  Pending will rx and  Advise decongestant pre flight.

## 2011-10-17 NOTE — Patient Instructions (Addendum)
Add nasal steroids to the maintenance For allergy asthma it may also help recurrent sinus infections.. Most sinus infections improved after 10 days without antibiotics however take the antibiotic with you use decongestants such as Sudafed before he could on the plain saline washes nasal cortisone.  If increasing nasal pressure not getting better add  the antibiotic.

## 2011-12-19 ENCOUNTER — Other Ambulatory Visit: Payer: Self-pay | Admitting: Internal Medicine

## 2011-12-23 ENCOUNTER — Other Ambulatory Visit: Payer: Self-pay

## 2011-12-23 MED ORDER — METOPROLOL SUCCINATE ER 50 MG PO TB24: 50.0000 mg | ORAL_TABLET | Freq: Every day | ORAL | Status: DC

## 2011-12-23 MED ORDER — FLUTICASONE PROPIONATE 50 MCG/ACT NA SUSP: 2.0000 | Freq: Every day | NASAL | Status: DC

## 2011-12-23 NOTE — Telephone Encounter (Signed)
Rx request for metoprolol 50 mg sent to [facility].

## 2012-03-15 ENCOUNTER — Other Ambulatory Visit: Payer: Self-pay | Admitting: Internal Medicine

## 2012-04-23 ENCOUNTER — Other Ambulatory Visit: Payer: Self-pay | Admitting: Internal Medicine

## 2012-06-03 ENCOUNTER — Other Ambulatory Visit: Payer: Self-pay | Admitting: Internal Medicine

## 2012-07-13 ENCOUNTER — Other Ambulatory Visit (INDEPENDENT_AMBULATORY_CARE_PROVIDER_SITE_OTHER): Payer: BC Managed Care – PPO

## 2012-07-13 DIAGNOSIS — Z0000 Encounter for general adult medical examination without abnormal findings: Secondary | ICD-10-CM

## 2012-07-13 LAB — CBC WITH DIFFERENTIAL/PLATELET
Basophils Absolute: 0 10*3/uL (ref 0.0–0.1)
Eosinophils Absolute: 0.1 10*3/uL (ref 0.0–0.7)
Hemoglobin: 13.4 g/dL (ref 12.0–15.0)
Lymphocytes Relative: 14.8 % (ref 12.0–46.0)
MCHC: 32.4 g/dL (ref 30.0–36.0)
Neutro Abs: 14.2 10*3/uL — ABNORMAL HIGH (ref 1.4–7.7)
Neutrophils Relative %: 80 % — ABNORMAL HIGH (ref 43.0–77.0)
RDW: 14 % (ref 11.5–14.6)

## 2012-07-13 LAB — POCT URINALYSIS DIPSTICK
Bilirubin, UA: NEGATIVE
Blood, UA: NEGATIVE
Glucose, UA: NEGATIVE
Ketones, UA: NEGATIVE
Spec Grav, UA: 1.025

## 2012-07-13 LAB — HEPATIC FUNCTION PANEL
ALT: 23 U/L (ref 0–35)
AST: 14 U/L (ref 0–37)
Alkaline Phosphatase: 37 U/L — ABNORMAL LOW (ref 39–117)
Bilirubin, Direct: 0.1 mg/dL (ref 0.0–0.3)
Total Protein: 6.9 g/dL (ref 6.0–8.3)

## 2012-07-13 LAB — BASIC METABOLIC PANEL
Chloride: 97 mEq/L (ref 96–112)
Creatinine, Ser: 0.7 mg/dL (ref 0.4–1.2)

## 2012-07-13 LAB — LIPID PANEL
Cholesterol: 182 mg/dL (ref 0–200)
LDL Cholesterol: 101 mg/dL — ABNORMAL HIGH (ref 0–99)
Triglycerides: 165 mg/dL — ABNORMAL HIGH (ref 0.0–149.0)

## 2012-07-20 ENCOUNTER — Ambulatory Visit (INDEPENDENT_AMBULATORY_CARE_PROVIDER_SITE_OTHER): Payer: BC Managed Care – PPO | Admitting: Internal Medicine

## 2012-07-20 ENCOUNTER — Encounter: Payer: Self-pay | Admitting: Internal Medicine

## 2012-07-20 VITALS — BP 144/88 | HR 98 | Temp 98.7°F | Ht 64.0 in | Wt 226.0 lb

## 2012-07-20 DIAGNOSIS — L989 Disorder of the skin and subcutaneous tissue, unspecified: Secondary | ICD-10-CM

## 2012-07-20 DIAGNOSIS — E669 Obesity, unspecified: Secondary | ICD-10-CM

## 2012-07-20 DIAGNOSIS — I498 Other specified cardiac arrhythmias: Secondary | ICD-10-CM

## 2012-07-20 DIAGNOSIS — Z23 Encounter for immunization: Secondary | ICD-10-CM

## 2012-07-20 DIAGNOSIS — Z0000 Encounter for general adult medical examination without abnormal findings: Secondary | ICD-10-CM | POA: Insufficient documentation

## 2012-07-20 DIAGNOSIS — I10 Essential (primary) hypertension: Secondary | ICD-10-CM

## 2012-07-20 DIAGNOSIS — J309 Allergic rhinitis, unspecified: Secondary | ICD-10-CM

## 2012-07-20 DIAGNOSIS — M25561 Pain in right knee: Secondary | ICD-10-CM | POA: Insufficient documentation

## 2012-07-20 DIAGNOSIS — M25569 Pain in unspecified knee: Secondary | ICD-10-CM

## 2012-07-20 DIAGNOSIS — J45909 Unspecified asthma, uncomplicated: Secondary | ICD-10-CM

## 2012-07-20 DIAGNOSIS — D485 Neoplasm of uncertain behavior of skin: Secondary | ICD-10-CM | POA: Insufficient documentation

## 2012-07-20 DIAGNOSIS — I471 Supraventricular tachycardia: Secondary | ICD-10-CM | POA: Insufficient documentation

## 2012-07-20 MED ORDER — FLUTICASONE-SALMETEROL 100-50 MCG/DOSE IN AEPB: 1.0000 | INHALATION_SPRAY | Freq: Two times a day (BID) | RESPIRATORY_TRACT | Status: DC

## 2012-07-20 MED ORDER — METOPROLOL SUCCINATE ER 50 MG PO TB24: 50.0000 mg | ORAL_TABLET | Freq: Every day | ORAL | Status: DC

## 2012-07-20 MED ORDER — FLUTICASONE PROPIONATE 50 MCG/ACT NA SUSP: 2.0000 | Freq: Every day | NASAL | Status: DC

## 2012-07-20 MED ORDER — ALBUTEROL SULFATE HFA 108 (90 BASE) MCG/ACT IN AERS: 2.0000 | INHALATION_SPRAY | Freq: Four times a day (QID) | RESPIRATORY_TRACT | Status: DC | PRN

## 2012-07-20 NOTE — Patient Instructions (Addendum)
Continue lifestyle intervention healthy eating and exercise . Will arrange derm consult about the area on your face . Cross train exercise and if interfere ing then can see sports medicine at your ortho office or see dr Ophelia Charter . The elevated blood count is most likely from the cortisone treatment you received for your neck.  Otherwise wellness visit in a year.    Knee Exercises EXERCISES RANGE OF MOTION(ROM) AND STRETCHING EXERCISES These exercises may help you when beginning to rehabilitate your injury. Your symptoms may resolve with or without further involvement from your physician, physical therapist or athletic trainer. While completing these exercises, remember:   Restoring tissue flexibility helps normal motion to return to the joints. This allows healthier, less painful movement and activity.  An effective stretch should be held for at least 30 seconds.  A stretch should never be painful. You should only feel a gentle lengthening or release in the stretched tissue. STRETCH - Knee Extension, Prone  Lie on your stomach on a firm surface, such as a bed or countertop. Place your right / left knee and leg just beyond the edge of the surface. You may wish to place a towel under the far end of your right / left thigh for comfort.  Relax your leg muscles and allow gravity to straighten your knee. Your clinician may advise you to add an ankle weight if more resistance is helpful for you.  You should feel a stretch in the back of your right / left knee. Hold this position for __________ seconds. Repeat __________ times. Complete this stretch __________ times per day. * Your physician, physical therapist or athletic trainer may ask you to add ankle weight to enhance your stretch.  RANGE OF MOTION - Knee Flexion, Active  Lie on your back with both knees straight. (If this causes back discomfort, bend your opposite knee, placing your foot flat on the floor.)  Slowly slide your heel back  toward your buttocks until you feel a gentle stretch in the front of your knee or thigh.  Hold for __________ seconds. Slowly slide your heel back to the starting position. Repeat __________ times. Complete this exercise __________ times per day.  STRETCH - Quadriceps, Prone   Lie on your stomach on a firm surface, such as a bed or padded floor.  Bend your right / left knee and grasp your ankle. If you are unable to reach, your ankle or pant leg, use a belt around your foot to lengthen your reach.  Gently pull your heel toward your buttocks. Your knee should not slide out to the side. You should feel a stretch in the front of your thigh and/or knee.  Hold this position for __________ seconds. Repeat __________ times. Complete this stretch __________ times per day.  STRETCH  Hamstrings, Supine   Lie on your back. Loop a belt or towel over the ball of your right / left foot.  Straighten your right / left knee and slowly pull on the belt to raise your leg. Do not allow the right / left knee to bend. Keep your opposite leg flat on the floor.  Raise the leg until you feel a gentle stretch behind your right / left knee or thigh. Hold this position for __________ seconds. Repeat __________ times. Complete this stretch __________ times per day.  STRENGTHENING EXERCISES These exercises may help you when beginning to rehabilitate your injury. They may resolve your symptoms with or without further involvement from your physician, physical therapist or  Event organiser. While completing these exercises, remember:   Muscles can gain both the endurance and the strength needed for everyday activities through controlled exercises.  Complete these exercises as instructed by your physician, physical therapist or athletic trainer. Progress the resistance and repetitions only as guided.  You may experience muscle soreness or fatigue, but the pain or discomfort you are trying to eliminate should never worsen  during these exercises. If this pain does worsen, stop and make certain you are following the directions exactly. If the pain is still present after adjustments, discontinue the exercise until you can discuss the trouble with your clinician. STRENGTH - Quadriceps, Isometrics  Lie on your back with your right / left leg extended and your opposite knee bent.  Gradually tense the muscles in the front of your right / left thigh. You should see either your knee cap slide up toward your hip or increased dimpling just above the knee. This motion will push the back of the knee down toward the floor/mat/bed on which you are lying.  Hold the muscle as tight as you can without increasing your pain for __________ seconds.  Relax the muscles slowly and completely in between each repetition. Repeat __________ times. Complete this exercise __________ times per day.  STRENGTH - Quadriceps, Short Arcs   Lie on your back. Place a __________ inch towel roll under your knee so that the knee slightly bends.  Raise only your lower leg by tightening the muscles in the front of your thigh. Do not allow your thigh to rise.  Hold this position for __________ seconds. Repeat __________ times. Complete this exercise __________ times per day.  OPTIONAL ANKLE WEIGHTS: Begin with ____________________, but DO NOT exceed ____________________. Increase in 1 pound/0.5 kilogram increments.  STRENGTH - Quadriceps, Straight Leg Raises  Quality counts! Watch for signs that the quadriceps muscle is working to insure you are strengthening the correct muscles and not "cheating" by substituting with healthier muscles.  Lay on your back with your right / left leg extended and your opposite knee bent.  Tense the muscles in the front of your right / left thigh. You should see either your knee cap slide up or increased dimpling just above the knee. Your thigh may even quiver.  Tighten these muscles even more and raise your leg 4 to 6  inches off the floor. Hold for __________ seconds.  Keeping these muscles tense, lower your leg.  Relax the muscles slowly and completely in between each repetition. Repeat __________ times. Complete this exercise __________ times per day.  STRENGTH - Hamstring, Curls  Lay on your stomach with your legs extended. (If you lay on a bed, your feet may hang over the edge.)  Tighten the muscles in the back of your thigh to bend your right / left knee up to 90 degrees. Keep your hips flat on the bed/floor.  Hold this position for __________ seconds.  Slowly lower your leg back to the starting position. Repeat __________ times. Complete this exercise __________ times per day.  OPTIONAL ANKLE WEIGHTS: Begin with ____________________, but DO NOT exceed ____________________. Increase in 1 pound/0.5 kilogram increments.  STRENGTH  Quadriceps, Squats  Stand in a door frame so that your feet and knees are in line with the frame.  Use your hands for balance, not support, on the frame.  Slowly lower your weight, bending at the hips and knees. Keep your lower legs upright so that they are parallel with the door frame. Squat only within  the range that does not increase your knee pain. Never let your hips drop below your knees.  Slowly return upright, pushing with your legs, not pulling with your hands. Repeat __________ times. Complete this exercise __________ times per day.  STRENGTH - Quadriceps, Wall Slides  Follow guidelines for form closely. Increased knee pain often results from poorly placed feet or knees.  Lean against a smooth wall or door and walk your feet out 18-24 inches. Place your feet hip-width apart.  Slowly slide down the wall or door until your knees bend __________ degrees.* Keep your knees over your heels, not your toes, and in line with your hips, not falling to either side.  Hold for __________ seconds. Stand up to rest for __________ seconds in between each  repetition. Repeat __________ times. Complete this exercise __________ times per day. * Your physician, physical therapist or athletic trainer will alter this angle based on your symptoms and progress. Document Released: 06/18/2005 Document Revised: 10/27/2011 Document Reviewed: 11/16/2008 [facility] Patient Information 2013 [place], [place].

## 2012-07-20 NOTE — Progress Notes (Signed)
Chief Complaint  Patient presents with  . Annual Exam  . Asthma    medications    HPI: Patient comes in today for Preventive Health Care visit . No major changes in health since her last visit; Other issues ; Right knee pain and crunching   Steps are hard.    No injury  going on for 3 montths  Recently evaluated by Dr. Ophelia Charter and treated with steroids systemically for pinched nerve in her neck doing better. Asthma doing ok  hasn't needed a rescue medicine for a while and was out please refill continuing Advair and Flonase. SVT and elevated blood pressure reading takes metoprolol XL once a day 50 mg Poss one case of rapid heart beat. Every one to 2 years lasts about 10 minutes Coughing got rid of it.  No recent problem Concern about a couple spots on her face one on the left and then a papule on the right that she's had for while question growing not bleeding ROS:  GEN/ HEENT: No fever, significant weight changes sweats headaches vision problems hearing changes, CV/ PULM; No chest pain shortness of breath cough, syncope,edema  change in exercise tolerance. GI /GU: No adominal pain, vomiting, change in bowel habits. No blood in the stool. No significant GU symptoms. SKIN/HEME: ,no acute skin rashes suspicious lesions or bleeding. No lymphadenopathy, nodules, masses.  NEURO/ PSYCH:  No neurologic signs such as weakness numbness. No depression anxiety. IMM/ Allergy: No unusual infections.  Allergy .   REST of 12 system review negative except as per HPI   Past Medical History  Diagnosis Date  . Asthma     Worse in winter  . Allergic rhinitis     Worse in winter , grasses  . Headache   . SVT (supraventricular tachycardia)     No longer a problem  . HEMORRHAGIC CYSTITIS 05/25/2009    Qualifier: Diagnosis of  By: Fabian Sharp MD, Neta Mends     Family History  Problem Relation Age of Onset  . Prostate cancer Father   . Other Mother     Died of asphyxiation    History   Social History  .  Marital Status: Married    Spouse Name: N/A    Number of Children: N/A  . Years of Education: N/A   Social History Main Topics  . Smoking status: Never Smoker   . Smokeless tobacco: None  . Alcohol Use: 0.0 oz/week    2-3 Glasses of wine per week  . Drug Use: None  . Sexually Active: None   Other Topics Concern  . None   Social History Narrative   MarriedEmployed [employer]  40 hours a weekHH of 2 Husband has a pet pythonPets dogNo etsDrinks milk and women's MV   Health Maintenance  Topic Date Due  . Influenza Vaccine  04/18/2013  . Pap Smear  12/24/2013  . Tetanus/tdap  12/24/2020  Patient Care Team: Madelin Headings, MD as PCP - General Juluis Mire, MD (Obstetrics and Gynecology) Eldred Manges, MD as Attending Physician (Orthopedic Surgery)  EXAM:  BP 144/88  Pulse 98  Temp 98.7 F (37.1 C) (Oral)  Ht 5\' 4"  (1.626 m)  Wt 226 lb (102.513 kg)  BMI 38.79 kg/m2  Body mass index is 38.79 kg/(m^2).  Physical Exam: Vital signs reviewed WUJ:WJXB is a well-developed well-nourished alert cooperative   female who appears her stated age in no acute distress.  HEENT: normocephalic atraumatic , Eyes: PERRL EOM's full, conjunctiva clear,  Nares: paten,t no deformity discharge or tenderness., Ears: no deformity EAC's clear TMs with normal landmarks. Mouth: clear OP, no lesions, edema.  Moist mucous membranes. Dentition in adequate repair. NECK: supple without masses, thyromegaly or bruits. CHEST/PULM:  Clear to auscultation and percussion breath sounds equal no wheeze , rales or rhonchi. No chest wall deformities or tenderness. Breast: normal by inspection . No dimpling, discharge, masses, tenderness or discharge . CV: PMI is nondisplaced, S1 S2 no gallops, murmurs, rubs. Peripheral pulses are full without delay.No JVD .  ABDOMEN: Bowel sounds normal nontender  No guard or rebound, no hepato splenomegal no CVA tenderness.  No hernia. Extremtities:  No clubbing cyanosis or edema, no  acute joint swelling or redness no focal atrophy NEURO:  Oriented x3, cranial nerves 3-12 appear to be intact, no obvious focal weakness,gait within normal limits no abnormal reflexes or asymmetrical SKIN: No acute rashes normal turgor, color, no bruising or petechiae. Right cheek with 2 mm light colored pearly papule that might have a center indentation left cheek area with a 3 mm symmetrical tan colored mole. PSYCH: Oriented, good eye contact, no obvious depression anxiety, cognition and judgment appear normal. LN: no cervical axillary inguinal adenopathy  Lab Results  Component Value Date   WBC 17.7 Repeated and verified X2.* 07/13/2012   HGB 13.4 07/13/2012   HCT 41.4 07/13/2012   PLT 472.0* 07/13/2012   GLUCOSE 75 07/13/2012   CHOL 182 07/13/2012   TRIG 165.0* 07/13/2012   HDL 47.70 07/13/2012   LDLCALC 101* 07/13/2012   ALT 23 07/13/2012   AST 14 07/13/2012   NA 135 07/13/2012   K 4.0 07/13/2012   CL 97 07/13/2012   CREATININE 0.7 07/13/2012   BUN 24* 07/13/2012   CO2 30 07/13/2012   TSH 0.59 07/13/2012   INR 1.0 01/24/2009    ASSESSMENT AND PLAN:  Discussed the following assessment and plan:  1. Visit for preventive health examination    2. Neoplasm of uncertain behavior of skin  Ambulatory referral to Dermatology   face cyst papule R/O bcca will refer  3. Knee pain, right     anterior with steps and crepitus. consder see sm ortho  4. Need for prophylactic vaccination and inoculation against influenza    5. ASTHMA     stable on controller meds add prevnar today   6. ALLERGIC RHINITIS    7. SVT (supraventricular tachycardia) rare recurrance      on metoprolol   8. Need for pneumococcal vaccination  Pneumococcal conjugate vaccine 13-valent less than 5yo IM  9. Obesity, unspecified     advise some weight loss would be helpful for her health  10. LABILE HYPERTENSION    11. Facial skin lesion  Ambulatory referral to Dermatology     Patient Instructions  Continue  lifestyle intervention healthy eating and exercise . Will arrange derm consult about the area on your face . Cross train exercise and if interfere ing then can see sports medicine at your ortho office or see dr Ophelia Charter . The elevated blood count is most likely from the cortisone treatment you received for your neck.  Otherwise wellness visit in a year.    Knee Exercises EXERCISES RANGE OF MOTION(ROM) AND STRETCHING EXERCISES These exercises may help you when beginning to rehabilitate your injury. Your symptoms may resolve with or without further involvement from your physician, physical therapist or athletic trainer. While completing these exercises, remember:   Restoring tissue flexibility helps normal motion to return to the  joints. This allows healthier, less painful movement and activity.  An effective stretch should be held for at least 30 seconds.  A stretch should never be painful. You should only feel a gentle lengthening or release in the stretched tissue. STRETCH - Knee Extension, Prone  Lie on your stomach on a firm surface, such as a bed or countertop. Place your right / left knee and leg just beyond the edge of the surface. You may wish to place a towel under the far end of your right / left thigh for comfort.  Relax your leg muscles and allow gravity to straighten your knee. Your clinician may advise you to add an ankle weight if more resistance is helpful for you.  You should feel a stretch in the back of your right / left knee. Hold this position for __________ seconds. Repeat __________ times. Complete this stretch __________ times per day. * Your physician, physical therapist or athletic trainer may ask you to add ankle weight to enhance your stretch.  RANGE OF MOTION - Knee Flexion, Active  Lie on your back with both knees straight. (If this causes back discomfort, bend your opposite knee, placing your foot flat on the floor.)  Slowly slide your heel back toward your  buttocks until you feel a gentle stretch in the front of your knee or thigh.  Hold for __________ seconds. Slowly slide your heel back to the starting position. Repeat __________ times. Complete this exercise __________ times per day.  STRETCH - Quadriceps, Prone   Lie on your stomach on a firm surface, such as a bed or padded floor.  Bend your right / left knee and grasp your ankle. If you are unable to reach, your ankle or pant leg, use a belt around your foot to lengthen your reach.  Gently pull your heel toward your buttocks. Your knee should not slide out to the side. You should feel a stretch in the front of your thigh and/or knee.  Hold this position for __________ seconds. Repeat __________ times. Complete this stretch __________ times per day.  STRETCH  Hamstrings, Supine   Lie on your back. Loop a belt or towel over the ball of your right / left foot.  Straighten your right / left knee and slowly pull on the belt to raise your leg. Do not allow the right / left knee to bend. Keep your opposite leg flat on the floor.  Raise the leg until you feel a gentle stretch behind your right / left knee or thigh. Hold this position for __________ seconds. Repeat __________ times. Complete this stretch __________ times per day.  STRENGTHENING EXERCISES These exercises may help you when beginning to rehabilitate your injury. They may resolve your symptoms with or without further involvement from your physician, physical therapist or athletic trainer. While completing these exercises, remember:   Muscles can gain both the endurance and the strength needed for everyday activities through controlled exercises.  Complete these exercises as instructed by your physician, physical therapist or athletic trainer. Progress the resistance and repetitions only as guided.  You may experience muscle soreness or fatigue, but the pain or discomfort you are trying to eliminate should never worsen during  these exercises. If this pain does worsen, stop and make certain you are following the directions exactly. If the pain is still present after adjustments, discontinue the exercise until you can discuss the trouble with your clinician. STRENGTH - Quadriceps, Isometrics  Lie on your back with your right /  left leg extended and your opposite knee bent.  Gradually tense the muscles in the front of your right / left thigh. You should see either your knee cap slide up toward your hip or increased dimpling just above the knee. This motion will push the back of the knee down toward the floor/mat/bed on which you are lying.  Hold the muscle as tight as you can without increasing your pain for __________ seconds.  Relax the muscles slowly and completely in between each repetition. Repeat __________ times. Complete this exercise __________ times per day.  STRENGTH - Quadriceps, Short Arcs   Lie on your back. Place a __________ inch towel roll under your knee so that the knee slightly bends.  Raise only your lower leg by tightening the muscles in the front of your thigh. Do not allow your thigh to rise.  Hold this position for __________ seconds. Repeat __________ times. Complete this exercise __________ times per day.  OPTIONAL ANKLE WEIGHTS: Begin with ____________________, but DO NOT exceed ____________________. Increase in 1 pound/0.5 kilogram increments.  STRENGTH - Quadriceps, Straight Leg Raises  Quality counts! Watch for signs that the quadriceps muscle is working to insure you are strengthening the correct muscles and not "cheating" by substituting with healthier muscles.  Lay on your back with your right / left leg extended and your opposite knee bent.  Tense the muscles in the front of your right / left thigh. You should see either your knee cap slide up or increased dimpling just above the knee. Your thigh may even quiver.  Tighten these muscles even more and raise your leg 4 to 6 inches  off the floor. Hold for __________ seconds.  Keeping these muscles tense, lower your leg.  Relax the muscles slowly and completely in between each repetition. Repeat __________ times. Complete this exercise __________ times per day.  STRENGTH - Hamstring, Curls  Lay on your stomach with your legs extended. (If you lay on a bed, your feet may hang over the edge.)  Tighten the muscles in the back of your thigh to bend your right / left knee up to 90 degrees. Keep your hips flat on the bed/floor.  Hold this position for __________ seconds.  Slowly lower your leg back to the starting position. Repeat __________ times. Complete this exercise __________ times per day.  OPTIONAL ANKLE WEIGHTS: Begin with ____________________, but DO NOT exceed ____________________. Increase in 1 pound/0.5 kilogram increments.  STRENGTH  Quadriceps, Squats  Stand in a door frame so that your feet and knees are in line with the frame.  Use your hands for balance, not support, on the frame.  Slowly lower your weight, bending at the hips and knees. Keep your lower legs upright so that they are parallel with the door frame. Squat only within the range that does not increase your knee pain. Never let your hips drop below your knees.  Slowly return upright, pushing with your legs, not pulling with your hands. Repeat __________ times. Complete this exercise __________ times per day.  STRENGTH - Quadriceps, Wall Slides  Follow guidelines for form closely. Increased knee pain often results from poorly placed feet or knees.  Lean against a smooth wall or door and walk your feet out 18-24 inches. Place your feet hip-width apart.  Slowly slide down the wall or door until your knees bend __________ degrees.* Keep your knees over your heels, not your toes, and in line with your hips, not falling to either side.  Hold for  __________ seconds. Stand up to rest for __________ seconds in between each repetition. Repeat  __________ times. Complete this exercise __________ times per day. * Your physician, physical therapist or athletic trainer will alter this angle based on your symptoms and progress. Document Released: 06/18/2005 Document Revised: 10/27/2011 Document Reviewed: 11/16/2008 [facility] Patient Information 2013 [place], [place].       Neta Mends. Osaze Hubbert M.D.

## 2013-06-17 ENCOUNTER — Other Ambulatory Visit: Payer: Self-pay | Admitting: Internal Medicine

## 2013-08-31 ENCOUNTER — Telehealth: Payer: Self-pay | Admitting: Internal Medicine

## 2013-08-31 NOTE — Telephone Encounter (Signed)
Please call pt on the cell #

## 2013-08-31 NOTE — Telephone Encounter (Signed)
Please clinically triage message  Med doesn't cure flu . Cough can last for 3 weeks  Can have ov tomorrow if she  Or triage feels needed

## 2013-08-31 NOTE — Telephone Encounter (Signed)
[facility]. Pt states she is not much better, no fever, deeper cough and weak. Pt wants to ee dr Regis Bill, no 30 min. pls adves.  (15 min at 3 pm)

## 2013-08-31 NOTE — Telephone Encounter (Signed)
Spoke to the pt.  Did not have much cough at the beginning when diagnosed with the flu.  Has gotten worse in the last few days.  Cough is productive of small amounts of yellow/green sputum.  She complains of SOB and wheezing.  Pt states that sometimes when she exhales that she hears a "rattling" or "gurgling" sound.  She is afraid of a severe asthma flare.  Pt scheduled for 05/02/2014 at 9:15.

## 2013-09-01 ENCOUNTER — Ambulatory Visit (INDEPENDENT_AMBULATORY_CARE_PROVIDER_SITE_OTHER)
Admission: RE | Admit: 2013-09-01 | Discharge: 2013-09-01 | Disposition: A | Discharge status: Home / Self Care | Payer: BC Managed Care – PPO | Source: Ambulatory Visit | Attending: Internal Medicine | Admitting: Internal Medicine

## 2013-09-01 ENCOUNTER — Ambulatory Visit (INDEPENDENT_AMBULATORY_CARE_PROVIDER_SITE_OTHER): Payer: BC Managed Care – PPO | Admitting: Internal Medicine

## 2013-09-01 ENCOUNTER — Encounter: Payer: Self-pay | Admitting: Internal Medicine

## 2013-09-01 VITALS — BP 154/82 | HR 73 | Temp 98.4°F | Ht 64.25 in | Wt 235.0 lb

## 2013-09-01 DIAGNOSIS — R05 Cough: Secondary | ICD-10-CM

## 2013-09-01 DIAGNOSIS — J45901 Unspecified asthma with (acute) exacerbation: Secondary | ICD-10-CM

## 2013-09-01 DIAGNOSIS — J111 Influenza due to unidentified influenza virus with other respiratory manifestations: Secondary | ICD-10-CM | POA: Insufficient documentation

## 2013-09-01 DIAGNOSIS — R059 Cough, unspecified: Secondary | ICD-10-CM

## 2013-09-01 MED ORDER — ALBUTEROL SULFATE (2.5 MG/3ML) 0.083% IN NEBU
2.5000 mg | INHALATION_SOLUTION | Freq: Once | RESPIRATORY_TRACT | Status: AC
  Administered 2013-09-01: 2.5 mg via RESPIRATORY_TRACT

## 2013-09-01 MED ORDER — PREDNISONE 20 MG PO TABS: ORAL_TABLET | ORAL | Status: DC

## 2013-09-01 NOTE — Progress Notes (Signed)
Chief Complaint  Patient presents with  . Cough    Cough is productive of a green/yellow sputum.  Sometimes when she exhales she hears a "rattling" or "gurgling" sound.  . Fatigue  . Shortness of Breath  . Wheezing    HPI: Patient comes in today for SDA for  new problem evaluation. See prev note seen other clinic and given antibiotic but not better   Records na Body ache fever  Pos flu  8th  And taiflu and aches are gone as well as fever.  Cough is worse and exhauseted  Tessalon perlles.  On advair evey day  ? If wheezing if breath deepley.  Bad hurst cause of coughing  ocass use of albuterol and not that helpful.  ROS: See pertinent positives and negatives per HPI.  Past Medical History  Diagnosis Date  . Asthma     Worse in winter  . Allergic rhinitis     Worse in winter , grasses  . Headache(784.0)   . SVT (supraventricular tachycardia)     No longer a problem  . HEMORRHAGIC CYSTITIS 05/25/2009    Qualifier: Diagnosis of  By: Regis Bill MD, Standley Brooking     Family History  Problem Relation Age of Onset  . Prostate cancer Father   . Other Mother     Died of asphyxiation    History   Social History  . Marital Status: Married    Spouse Name: N/A    Number of Children: N/A  . Years of Education: N/A   Social History Main Topics  . Smoking status: Never Smoker   . Smokeless tobacco: None  . Alcohol Use: 0.0 oz/week    2-3 Glasses of wine per week  . Drug Use: None  . Sexual Activity: None   Other Topics Concern  . None   Social History Narrative   Married   Employed [employer]  40 hours a week   HH of 2    Husband has a Economist   No ets   Drinks milk and women's MV    Outpatient Encounter Prescriptions as of 09/01/2013  Medication Sig  . albuterol (PROAIR HFA) 108 (90 BASE) MCG/ACT inhaler Inhale 2 puffs into the lungs every 6 (six) hours as needed for wheezing.  . benzonatate (TESSALON) 200 MG capsule   . Fluticasone-Salmeterol (ADVAIR  DISKUS) 100-50 MCG/DOSE AEPB Inhale 1 puff into the lungs 2 (two) times daily. Or as directed  . metoprolol succinate (TOPROL-XL) 50 MG 24 hr tablet TAKE 1 TABLET DAILY WITH OR IMMEDIATELY FOLLOWING A MEAL  . fluticasone (FLONASE) 50 MCG/ACT nasal spray Place 2 sprays into the nose daily. 2 each nostril  . predniSONE (DELTASONE) 20 MG tablet Take 3 po qd for 2 days then 2 po qd for 3 days,or as directed  . albuterol (PROVENTIL) (2.5 MG/3ML) 0.083% nebulizer solution 2.5 mg     EXAM:  BP 154/82  Pulse 73  Temp(Src) 98.4 F (36.9 C) (Oral)  Ht 5' 4.25" (1.632 m)  Wt 235 lb (106.595 kg)  BMI 40.02 kg/m2  SpO2 97%  Body mass index is 40.02 kg/(m^2).  GENERAL: vitals reviewed and listed above, alert, oriented, appears well hydrated and in no acute distress HEENT: atraumatic, conjunctiva  clear, no obvious abnormalities on inspection of external nose and ears  tms clear OP : no lesion edema or exudate  NECK: no obvious masses on inspection palpation no adenopathy face non tender  LUNGS: clear to  auscultation bilaterally, no wheezes, rales or rhonchi, ? dexc air movement  Improved and looser cough after nebulizer  CV: HRRR, no clubbing cyanosis or  peripheral edema nl cap refill  MS: moves all extremities without noticeable focal  abnormality PSYCH: pleasant and cooperative, no obvious depression or anxiety  ASSESSMENT AND PLAN:  Discussed the following assessment and plan:  Unspecified asthma, with exacerbation - Plan: DG Chest 2 View, albuterol (PROVENTIL) (2.5 MG/3ML) 0.083% nebulizer solution 2.5 mg  Cough - Plan: DG Chest 2 View, albuterol (PROVENTIL) (2.5 MG/3ML) 0.083% nebulizer solution 2.5 mg  Influenza - pos test sp tamiflu no fever persisten cough  - Plan: DG Chest 2 View  -Patient advised to return or notify health care team  if symptoms worsen or persist or new concerns arise.  Patient Instructions  The flu mauy have caused a mild asthma flare . No current signs of  pneumonia because of breathing and severity get  Chest x ray  If neg  We can add  Short course of prednisone   Contact us if fever or worsening  Cough can last 2-3 weeks otherwise    Mariann Laster K. Panosh M.D.  Pre visit review using our clinic review tool, if applicable. No additional management support is needed unless otherwise documented below in the visit note.

## 2013-09-01 NOTE — Patient Instructions (Addendum)
The flu mauy have caused a mild asthma flare . No current signs of pneumonia because of breathing and severity get  Chest x ray  If neg  We can add  Short course of prednisone   Contact us if fever or worsening  Cough can last 2-3 weeks otherwise

## 2013-10-19 ENCOUNTER — Other Ambulatory Visit (INDEPENDENT_AMBULATORY_CARE_PROVIDER_SITE_OTHER): Payer: BC Managed Care – PPO

## 2013-10-19 DIAGNOSIS — Z0000 Encounter for general adult medical examination without abnormal findings: Secondary | ICD-10-CM

## 2013-10-19 LAB — BASIC METABOLIC PANEL
BUN: 19 mg/dL (ref 6–23)
CHLORIDE: 102 meq/L (ref 96–112)
CO2: 30 meq/L (ref 19–32)
CREATININE: 0.6 mg/dL (ref 0.4–1.2)
Calcium: 8.8 mg/dL (ref 8.4–10.5)
GFR: 110.05 mL/min (ref >60.00)
Glucose, Bld: 87 mg/dL (ref 70–99)
POTASSIUM: 4 meq/L (ref 3.5–5.1)
SODIUM: 139 meq/L (ref 135–145)

## 2013-10-19 LAB — CBC WITH DIFFERENTIAL/PLATELET
BASOS ABS: 0.1 10*3/uL (ref 0.0–0.1)
Basophils Relative: 0.8 % (ref 0.0–3.0)
EOS ABS: 0.6 10*3/uL (ref 0.0–0.7)
Eosinophils Relative: 7.9 % — ABNORMAL HIGH (ref 0.0–5.0)
HCT: 41.3 % (ref 36.0–46.0)
HEMOGLOBIN: 13.6 g/dL (ref 12.0–15.0)
LYMPHS PCT: 25.5 % (ref 12.0–46.0)
Lymphs Abs: 1.8 10*3/uL (ref 0.7–4.0)
MCHC: 33 g/dL (ref 30.0–36.0)
MCV: 84.8 fl (ref 78.0–100.0)
Monocytes Absolute: 0.5 10*3/uL (ref 0.1–1.0)
Monocytes Relative: 6.6 % (ref 3.0–12.0)
NEUTROS ABS: 4.2 10*3/uL (ref 1.4–7.7)
Neutrophils Relative %: 59.2 % (ref 43.0–77.0)
PLATELETS: 499 10*3/uL — AB (ref 150.0–400.0)
RBC: 4.87 Mil/uL (ref 3.87–5.11)
RDW: 14.3 % (ref 11.5–14.6)
WBC: 7.1 10*3/uL (ref 4.5–10.5)

## 2013-10-19 LAB — HEPATIC FUNCTION PANEL
ALBUMIN: 3.8 g/dL (ref 3.5–5.2)
ALT: 22 U/L (ref 0–35)
AST: 18 U/L (ref 0–37)
Alkaline Phosphatase: 43 U/L (ref 39–117)
BILIRUBIN DIRECT: 0.1 mg/dL (ref 0.0–0.3)
TOTAL PROTEIN: 7.1 g/dL (ref 6.0–8.3)
Total Bilirubin: 0.9 mg/dL (ref 0.3–1.2)

## 2013-10-19 LAB — LIPID PANEL
CHOLESTEROL: 188 mg/dL (ref 0–200)
HDL: 43.6 mg/dL (ref >39.00)
LDL CALC: 116 mg/dL — AB (ref 0–99)
TRIGLYCERIDES: 142 mg/dL (ref 0.0–149.0)
Total CHOL/HDL Ratio: 4
VLDL: 28.4 mg/dL (ref 0.0–40.0)

## 2013-10-19 LAB — TSH: TSH: 0.75 u[IU]/mL (ref 0.35–5.50)

## 2013-10-26 ENCOUNTER — Telehealth: Payer: Self-pay | Admitting: Internal Medicine

## 2013-10-26 ENCOUNTER — Encounter: Payer: Self-pay | Admitting: Internal Medicine

## 2013-10-26 ENCOUNTER — Ambulatory Visit (INDEPENDENT_AMBULATORY_CARE_PROVIDER_SITE_OTHER): Payer: BC Managed Care – PPO | Admitting: Internal Medicine

## 2013-10-26 VITALS — BP 158/98 | Temp 98.0°F | Ht 64.0 in | Wt 240.0 lb

## 2013-10-26 DIAGNOSIS — Z0000 Encounter for general adult medical examination without abnormal findings: Secondary | ICD-10-CM

## 2013-10-26 DIAGNOSIS — I498 Other specified cardiac arrhythmias: Secondary | ICD-10-CM

## 2013-10-26 DIAGNOSIS — I471 Supraventricular tachycardia: Secondary | ICD-10-CM

## 2013-10-26 DIAGNOSIS — I10 Essential (primary) hypertension: Secondary | ICD-10-CM

## 2013-10-26 DIAGNOSIS — D759 Disease of blood and blood-forming organs, unspecified: Secondary | ICD-10-CM

## 2013-10-26 DIAGNOSIS — E669 Obesity, unspecified: Secondary | ICD-10-CM

## 2013-10-26 DIAGNOSIS — Z23 Encounter for immunization: Secondary | ICD-10-CM

## 2013-10-26 DIAGNOSIS — J45909 Unspecified asthma, uncomplicated: Secondary | ICD-10-CM

## 2013-10-26 MED ORDER — METOPROLOL SUCCINATE ER 50 MG PO TB24: 50.0000 mg | ORAL_TABLET | Freq: Every day | ORAL | Status: DC

## 2013-10-26 MED ORDER — LISINOPRIL 10 MG PO TABS
10.0000 mg | ORAL_TABLET | Freq: Every day | ORAL | Status: DC
Start: 2013-10-26 — End: 2013-11-14

## 2013-10-26 MED ORDER — FLUTICASONE PROPIONATE 50 MCG/ACT NA SUSP: 2.0000 | Freq: Every day | NASAL | Status: DC

## 2013-10-26 MED ORDER — ALBUTEROL SULFATE HFA 108 (90 BASE) MCG/ACT IN AERS: 2.0000 | INHALATION_SPRAY | Freq: Four times a day (QID) | RESPIRATORY_TRACT | Status: DC | PRN

## 2013-10-26 NOTE — Progress Notes (Signed)
Chief Complaint  Patient presents with  . Annual Exam    HPI: Patient comes in today for Preventive Health Care visit   Resp: Better than before   needs refills mail away.  On toprol; qd no svt episodes for 1-2 years    bp at home in the 150 range often.   To have pap  Dr Radene Knee next week. mammo due  Health Maintenance  Topic Date Due  . Mammogram  02/22/2013  . Pap Smear  12/25/2013  . Influenza Vaccine  03/18/2014  . Tetanus/tdap  12/24/2020  . Colonoscopy  02/19/2021   Health Maintenance Review   ROS:  No osa sx  GEN/ HEENT: No fever, significant weight changes sweats headaches vision problems hearing changes, CV/ PULM; No chest pain shortness of breath cough, syncope,edema  change in exercise tolerance. GI /GU: No adominal pain, vomiting, change in bowel habits. No blood in the stool. No significant GU symptoms. SKIN/HEME: ,no acute skin rashes suspicious lesions or bleeding. No lymphadenopathy, nodules, masses.  NEURO/ PSYCH:  No neurologic signs such as weakness numbness. No depression anxiety. IMM/ Allergy: No unusual infections.  Allergy .   REST of 12 system review negative except as per HPI   Past Medical History  Diagnosis Date  . Asthma     Worse in winter  . Allergic rhinitis     Worse in winter , grasses  . Headache(784.0)   . SVT (supraventricular tachycardia)     No longer a problem  . HEMORRHAGIC CYSTITIS 05/25/2009    Qualifier: Diagnosis of  By: Regis Bill MD, Standley Brooking     Family History  Problem Relation Age of Onset  . Prostate cancer Father   . Other Mother     Died of asphyxiation    History   Social History  . Marital Status: Married    Spouse Name: N/A    Number of Children: N/A  . Years of Education: N/A   Social History Main Topics  . Smoking status: Never Smoker   . Smokeless tobacco: None  . Alcohol Use: 0.0 oz/week    2-3 Glasses of wine per week  . Drug Use: None  . Sexual Activity: None   Other Topics Concern  .  None   Social History Narrative   Married   Employed [employer]  40 hours a week   HH of 2    Husband has a Economist   No ets   Drinks milk and women's MV   Soc etoh only rare no tob ets    Outpatient Encounter Prescriptions as of 10/26/2013  Medication Sig  . albuterol (PROAIR HFA) 108 (90 BASE) MCG/ACT inhaler Inhale 2 puffs into the lungs every 6 (six) hours as needed for wheezing.  . Fluticasone-Salmeterol (ADVAIR DISKUS) 100-50 MCG/DOSE AEPB Inhale 1 puff into the lungs 2 (two) times daily. Or as directed  . metoprolol succinate (TOPROL-XL) 50 MG 24 hr tablet Take 1 tablet (50 mg total) by mouth daily. Take with or immediately following a meal.  . Multiple Vitamin (MULTI-VITAMIN PO) Take by mouth.  . [DISCONTINUED] albuterol (PROAIR HFA) 108 (90 BASE) MCG/ACT inhaler Inhale 2 puffs into the lungs every 6 (six) hours as needed for wheezing.  . [DISCONTINUED] metoprolol succinate (TOPROL-XL) 50 MG 24 hr tablet TAKE 1 TABLET DAILY WITH OR IMMEDIATELY FOLLOWING A MEAL  . fluticasone (FLONASE) 50 MCG/ACT nasal spray Place 2 sprays into both nostrils daily. 2 each nostril  .  lisinopril (PRINIVIL,ZESTRIL) 10 MG tablet Take 1 tablet (10 mg total) by mouth daily. May increase to 20 mg per day  . [DISCONTINUED] benzonatate (TESSALON) 200 MG capsule   . [DISCONTINUED] fluticasone (FLONASE) 50 MCG/ACT nasal spray Place 2 sprays into the nose daily. 2 each nostril  . [DISCONTINUED] predniSONE (DELTASONE) 20 MG tablet Take 3 po qd for 2 days then 2 po qd for 3 days,or as directed    EXAM:  BP 158/98  Temp(Src) 98 F (36.7 C) (Oral)  Ht 5\' 4"  (1.626 m)  Wt 240 lb (108.863 kg)  BMI 41.18 kg/m2  Body mass index is 41.18 kg/(m^2).  getting 155/80 average  bp  BP Readings from Last 3 Encounters:  10/26/13 158/98  09/01/13 154/82  07/20/12 144/88   Wt Readings from Last 3 Encounters:  10/26/13 240 lb (108.863 kg)  09/01/13 235 lb (106.595 kg)  07/20/12 226 lb (102.513  kg)     Physical Exam: Vital signs reviewed repeat 158/98 right  OJJ:KKXF is a well-developed well-nourished alert cooperative    who appearsr stated age in no acute distress.  HEENT: normocephalic atraumatic , Eyes: PERRL EOM's full, conjunctiva clear, Nares: paten,t no deformity discharge or tenderness., Ears: no deformity EAC's clear TMs with normal landmarks. Mouth: clear OP, no lesions, edema.  Moist mucous membranes. Dentition in adequate repair. NECK: supple without masses, thyromegaly or bruits. CHEST/PULM:  Clear to auscultation and percussion breath sounds equal no wheeze , rales or rhonchi. No chest wall deformities or tenderness. Breast: normal by inspection . No dimpling, discharge, masses, tenderness or discharge . CV: PMI is nondisplaced, S1 S2 no gallops, murmurs, rubs. Peripheral pulses are full without delay.No JVD .  ABDOMEN: Bowel sounds normal nontender  No guard or rebound, no hepato splenomegal no CVA tenderness.  No hernia. Extremtities:  No clubbing cyanosis or edema, no acute joint swelling or redness no focal atrophy NEURO:  Oriented x3, cranial nerves 3-12 appear to be intact, no obvious focal weakness,gait within normal limits no abnormal reflexes or asymmetrical SKIN: No acute rashes normal turgor, color, no bruising or petechiae. PSYCH: Oriented, good eye contact, no obvious depression anxiety, cognition and judgment appear normal. LN: no cervical axillary inguinal adenopathy  Lab Results  Component Value Date   WBC 7.1 10/19/2013   HGB 13.6 10/19/2013   HCT 41.3 10/19/2013   PLT 499.0* 10/19/2013   GLUCOSE 87 10/19/2013   CHOL 188 10/19/2013   TRIG 142.0 10/19/2013   HDL 43.60 10/19/2013   LDLCALC 116* 10/19/2013   ALT 22 10/19/2013   AST 18 10/19/2013   NA 139 10/19/2013   K 4.0 10/19/2013   CL 102 10/19/2013   CREATININE 0.6 10/19/2013   BUN 19 10/19/2013   CO2 30 10/19/2013   TSH 0.75 10/19/2013   INR 1.0 01/24/2009    ASSESSMENT AND PLAN:  Discussed the following assessment  and plan:  Visit for preventive health examination - to get pap and mammo  Need for prophylactic vaccination and inoculation against influenza - Plan: Flu Vaccine QUAD 36+ mos PF IM (Fluarix)  SVT (supraventricular tachycardia) - suppressed b blocker at thius time  Unspecified essential hypertension - consistently elevated  add ace and fu monitoring   Obesity, unspecified  ASTHMA - on controller meds  let us know which steroid inhaler is covered bu her insuarnace for refills   THROMBOCYTOSIS - still in slightly elevaetd range up and down monitor yearly   Patient Care Team: Burnis Medin, MD as  PCP - General Darlyn Chamber, MD (Obstetrics and Gynecology) Marybelle Killings, MD as Attending Physician (Orthopedic Surgery) Patient Instructions  lifes tyle intervention150 minutes of exercise weeks  ,  Lose weight  To healthy levels. Avoid trans fats and processed foods;  Increase fresh fruits and veges to 5 servings per day. And avoid sweet beverages  Including tea and juice.  Add ace  inhibtor for blood pressure control  Check blood pressure readings inver the nexrt 1-2 months preiodically  return office visit  in 1-2 months at some point we will need to  Potassium levels on medication when controlled     Mariann Laster K. Panosh M.D.    Pre visit review using our clinic review tool, if applicable. No additional management support is needed unless otherwise documented below in the visit note.

## 2013-10-26 NOTE — Telephone Encounter (Signed)
Relevant patient education assigned to patient using Emmi. ° °

## 2013-10-26 NOTE — Patient Instructions (Addendum)
lifes tyle intervention150 minutes of exercise weeks  ,  Lose weight  To healthy levels. Avoid trans fats and processed foods;  Increase fresh fruits and veges to 5 servings per day. And avoid sweet beverages  Including tea and juice.  Add ace  inhibtor for blood pressure control  Check blood pressure readings inver the nexrt 1-2 months preiodically  return office visit  in 1-2 months at some point we will need to  Potassium levels on medication when controlled

## 2013-11-08 ENCOUNTER — Ambulatory Visit (INDEPENDENT_AMBULATORY_CARE_PROVIDER_SITE_OTHER): Payer: BC Managed Care – PPO | Admitting: Internal Medicine

## 2013-11-08 ENCOUNTER — Encounter: Payer: Self-pay | Admitting: Internal Medicine

## 2013-11-08 VITALS — BP 166/100 | Temp 98.2°F | Ht 64.0 in | Wt 245.0 lb

## 2013-11-08 DIAGNOSIS — R300 Dysuria: Secondary | ICD-10-CM

## 2013-11-08 DIAGNOSIS — N390 Urinary tract infection, site not specified: Secondary | ICD-10-CM

## 2013-11-08 LAB — POCT URINALYSIS DIPSTICK
Bilirubin, UA: NEGATIVE
Blood, UA: 2
GLUCOSE UA: NEGATIVE
Ketones, UA: NEGATIVE
NITRITE UA: NEGATIVE
Protein, UA: 2
UROBILINOGEN UA: 0.2
pH, UA: 6

## 2013-11-08 MED ORDER — PHENAZOPYRIDINE HCL 100 MG PO TABS: 100.0000 mg | ORAL_TABLET | Freq: Three times a day (TID) | ORAL | Status: DC | PRN

## 2013-11-08 MED ORDER — SULFAMETHOXAZOLE-TRIMETHOPRIM 800-160 MG PO TABS: 1.0000 | ORAL_TABLET | Freq: Two times a day (BID) | ORAL | Status: DC

## 2013-11-08 NOTE — Patient Instructions (Signed)
This acts like acute UTI  Begin antibiotic and will let you know results .  Urinary Tract Infection Urinary tract infections (UTIs) can develop anywhere along your urinary tract. Your urinary tract is your body's drainage system for removing wastes and extra water. Your urinary tract includes two kidneys, two ureters, a bladder, and a urethra. Your kidneys are a pair of bean-shaped organs. Each kidney is about the size of your fist. They are located below your ribs, one on each side of your spine. CAUSES Infections are caused by microbes, which are microscopic organisms, including fungi, viruses, and bacteria. These organisms are so small that they can only be seen through a microscope. Bacteria are the microbes that most commonly cause UTIs. SYMPTOMS  Symptoms of UTIs may vary by age and gender of the patient and by the location of the infection. Symptoms in young women typically include a frequent and intense urge to urinate and a painful, burning feeling in the bladder or urethra during urination. Older women and men are more likely to be tired, shaky, and weak and have muscle aches and abdominal pain. A fever may mean the infection is in your kidneys. Other symptoms of a kidney infection include pain in your back or sides below the ribs, nausea, and vomiting. DIAGNOSIS To diagnose a UTI, your caregiver will ask you about your symptoms. Your caregiver also will ask to provide a urine sample. The urine sample will be tested for bacteria and white blood cells. White blood cells are made by your body to help fight infection. TREATMENT  Typically, UTIs can be treated with medication. Because most UTIs are caused by a bacterial infection, they usually can be treated with the use of antibiotics. The choice of antibiotic and length of treatment depend on your symptoms and the type of bacteria causing your infection. HOME CARE INSTRUCTIONS  If you were prescribed antibiotics, take them exactly as your  caregiver instructs you. Finish the medication even if you feel better after you have only taken some of the medication.  Drink enough water and fluids to keep your urine clear or pale yellow.  Avoid caffeine, tea, and carbonated beverages. They tend to irritate your bladder.  Empty your bladder often. Avoid holding urine for long periods of time.  Empty your bladder before and after sexual intercourse.  After a bowel movement, women should cleanse from front to back. Use each tissue only once. SEEK MEDICAL CARE IF:   You have back pain.  You develop a fever.  Your symptoms do not begin to resolve within 3 days. SEEK IMMEDIATE MEDICAL CARE IF:   You have severe back pain or lower abdominal pain.  You develop chills.  You have nausea or vomiting.  You have continued burning or discomfort with urination. MAKE SURE YOU:   Understand these instructions.  Will watch your condition.  Will get help right away if you are not doing well or get worse. Document Released: 05/14/2005 Document Revised: 02/03/2012 Document Reviewed: 09/12/2011 [facility] Patient Information 2014 [place].

## 2013-11-08 NOTE — Progress Notes (Signed)
Pre visit review using our clinic review tool, if applicable. No additional management support is needed unless otherwise documented below in the visit note.   Chief Complaint  Patient presents with  . Dysuria    Started yesterday.  . Abdominal Pain    HPI: Patient comes in today for SDA for  new problem evaluation. Nausea acute onset for i day and urine with blood when wipe.  X 1 day  No fever  Took celery seeds.  No help  Remote hx of uti no recent antibiotic  ROS: See pertinent positives and negatives per HPI. No fever chills or Flank pain   Past Medical History  Diagnosis Date  . Asthma     Worse in winter  . Allergic rhinitis     Worse in winter , grasses  . Headache(784.0)   . SVT (supraventricular tachycardia)     No longer a problem  . HEMORRHAGIC CYSTITIS 05/25/2009    Qualifier: Diagnosis of  By: Regis Bill MD, Standley Brooking     Family History  Problem Relation Age of Onset  . Prostate cancer Father   . Other Mother     Died of asphyxiation    History   Social History  . Marital Status: Married    Spouse Name: N/A    Number of Children: N/A  . Years of Education: N/A   Social History Main Topics  . Smoking status: Never Smoker   . Smokeless tobacco: None  . Alcohol Use: 0.0 oz/week    2-3 Glasses of wine per week  . Drug Use: None  . Sexual Activity: None   Other Topics Concern  . None   Social History Narrative   Married   Employed [employer]  40 hours a week   HH of 2    Husband has a Economist   No ets   Drinks [employer] women[employer] MV   Soc etoh only rare no tob ets    Outpatient Encounter Prescriptions as of 11/08/2013  Medication Sig  . albuterol (PROAIR HFA) 108 (90 BASE) MCG/ACT inhaler Inhale 2 puffs into the lungs every 6 (six) hours as needed for wheezing.  . fluticasone (FLONASE) 50 MCG/ACT nasal spray Place 2 sprays into both nostrils daily. 2 each nostril  . Fluticasone-Salmeterol (ADVAIR DISKUS) 100-50 MCG/DOSE AEPB Inhale 1  puff into the lungs 2 (two) times daily. Or as directed  . lisinopril (PRINIVIL,ZESTRIL) 10 MG tablet Take 1 tablet (10 mg total) by mouth daily. May increase to 20 mg per day  . metoprolol succinate (TOPROL-XL) 50 MG 24 hr tablet Take 1 tablet (50 mg total) by mouth daily. Take with or immediately following a meal.  . Multiple Vitamin (MULTI-VITAMIN PO) Take by mouth.  . phenazopyridine (PYRIDIUM) 100 MG tablet Take 1 tablet (100 mg total) by mouth 3 (three) times daily as needed for pain (bladder pain).  Marland Kitchen sulfamethoxazole-trimethoprim (SEPTRA DS) 800-160 MG per tablet Take 1 tablet by mouth 2 (two) times daily.    EXAM:  BP 166/100  Temp(Src) 98.2 F (36.8 C) (Oral)  Ht 5\' 4"  (1.626 m)  Wt 245 lb (111.131 kg)  BMI 42.03 kg/m2  Body mass index is 42.03 kg/(m^2).  GENERAL: vitals reviewed and listed above, alert, oriented, appears well hydrated and in mild  acute distress HEENT: atraumatic, conjunctiva  clear, no obvious abnormalities on inspection of external nose and ears  NECK: no obvious masses on inspection palpation  Abdomen:  Sof,t normal  bowel sounds without hepatosplenomegaly, no guarding rebound or masses no CVA tenderness very tender suprapubic are no g no masses CV: HRRR, no clubbing cyanosis or  peripheral edema nl cap refill  MS: moves all extremities without noticeable focal  Abnormality UA  2+ blood and 1+ leuk  ASSESSMENT AND PLAN:  Discussed the following assessment and plan:  Dysuria - Plan: POC Urinalysis Dipstick, Urine culture  UTI (urinary tract infection)   Expectant management.  -Patient advised to return or notify health care team  if symptoms worsen ,persist or new concerns arise.  Patient Instructions  This acts like acute UTI  Begin antibiotic and will let you know results .  Urinary Tract Infection Urinary tract infections (UTIs) can develop anywhere along your urinary tract. Your urinary tract is your body's drainage system for removing wastes  and extra water. Your urinary tract includes two kidneys, two ureters, a bladder, and a urethra. Your kidneys are a pair of bean-shaped organs. Each kidney is about the size of your fist. They are located below your ribs, one on each side of your spine. CAUSES Infections are caused by microbes, which are microscopic organisms, including fungi, viruses, and bacteria. These organisms are so small that they can only be seen through a microscope. Bacteria are the microbes that most commonly cause UTIs. SYMPTOMS  Symptoms of UTIs may vary by age and gender of the patient and by the location of the infection. Symptoms in young women typically include a frequent and intense urge to urinate and a painful, burning feeling in the bladder or urethra during urination. Older women and men are more likely to be tired, shaky, and weak and have muscle aches and abdominal pain. A fever may mean the infection is in your kidneys. Other symptoms of a kidney infection include pain in your back or sides below the ribs, nausea, and vomiting. DIAGNOSIS To diagnose a UTI, your caregiver will ask you about your symptoms. Your caregiver also will ask to provide a urine sample. The urine sample will be tested for bacteria and white blood cells. White blood cells are made by your body to help fight infection. TREATMENT  Typically, UTIs can be treated with medication. Because most UTIs are caused by a bacterial infection, they usually can be treated with the use of antibiotics. The choice of antibiotic and length of treatment depend on your symptoms and the type of bacteria causing your infection. HOME CARE INSTRUCTIONS  If you were prescribed antibiotics, take them exactly as your caregiver instructs you. Finish the medication even if you feel better after you have only taken some of the medication.  Drink enough water and fluids to keep your urine clear or pale yellow.  Avoid caffeine, tea, and carbonated beverages. They tend to  irritate your bladder.  Empty your bladder often. Avoid holding urine for long periods of time.  Empty your bladder before and after sexual intercourse.  After a bowel movement, women should cleanse from front to back. Use each tissue only once. SEEK MEDICAL CARE IF:   You have back pain.  You develop a fever.  Your symptoms do not begin to resolve within 3 days. SEEK IMMEDIATE MEDICAL CARE IF:   You have severe back pain or lower abdominal pain.  You develop chills.  You have nausea or vomiting.  You have continued burning or discomfort with urination. MAKE SURE YOU:   Understand these instructions.  Will watch your condition.  Will get help right away if you  are not doing well or get worse. Document Released: 05/14/2005 Document Revised: 02/03/2012 Document Reviewed: 09/12/2011 [facility] Patient Information 2014 [place].      Standley Brooking. Panosh M.D.

## 2013-11-11 ENCOUNTER — Other Ambulatory Visit: Payer: Self-pay | Admitting: Internal Medicine

## 2013-11-11 LAB — URINE CULTURE

## 2013-11-11 MED ORDER — NITROFURANTOIN MONOHYD MACRO 100 MG PO CAPS: 100.0000 mg | ORAL_CAPSULE | Freq: Two times a day (BID) | ORAL | Status: DC

## 2013-11-14 ENCOUNTER — Other Ambulatory Visit: Payer: Self-pay | Admitting: Family Medicine

## 2013-11-14 MED ORDER — LISINOPRIL 10 MG PO TABS: 20.0000 mg | ORAL_TABLET | Freq: Every day | ORAL | Status: DC

## 2013-11-18 ENCOUNTER — Other Ambulatory Visit: Payer: Self-pay | Admitting: Internal Medicine

## 2013-11-25 ENCOUNTER — Ambulatory Visit (INDEPENDENT_AMBULATORY_CARE_PROVIDER_SITE_OTHER): Payer: BC Managed Care – PPO | Admitting: Internal Medicine

## 2013-11-25 ENCOUNTER — Encounter: Payer: Self-pay | Admitting: Internal Medicine

## 2013-11-25 VITALS — BP 142/90 | HR 85 | Temp 98.2°F | Ht 64.0 in | Wt 236.0 lb

## 2013-11-25 DIAGNOSIS — J45909 Unspecified asthma, uncomplicated: Secondary | ICD-10-CM

## 2013-11-25 DIAGNOSIS — I10 Essential (primary) hypertension: Secondary | ICD-10-CM

## 2013-11-25 MED ORDER — VALSARTAN 160 MG PO TABS: 160.0000 mg | ORAL_TABLET | Freq: Every day | ORAL | Status: DC

## 2013-11-25 MED ORDER — PREDNISONE 20 MG PO TABS: ORAL_TABLET | ORAL | Status: DC

## 2013-11-25 MED ORDER — ALBUTEROL SULFATE (2.5 MG/3ML) 0.083% IN NEBU: 2.5000 mg | INHALATION_SOLUTION | Freq: Four times a day (QID) | RESPIRATORY_TRACT | Status: DC | PRN

## 2013-11-25 NOTE — Assessment & Plan Note (Signed)
Discontinue ACE inhibitor as it may be contributing to her asthma flare. Switch to valsartan 160 mg. Arrange followup electrolytes and kidney function.   BP: 142/90 mmHg  Further management as per PCP.

## 2013-11-25 NOTE — Patient Instructions (Signed)
Follow up with Dr. Regis Bill within 1 month Please contact our office if your symptoms do not improve or gets worse. Please complete the following lab tests before your next follow up appointment: BMET - [phone]

## 2013-11-25 NOTE — Progress Notes (Signed)
Subjective:    Patient ID: Danielle Martin, female    DOB: Aug 05, 1961, 53 y.o.   MRN: [id number]  HPI  53 year old white female with history of asthma, allergic rhinitis and hypertension presents with productive cough for 4 days. Patient reports she has had lifelong asthma that's usually well-controlled. Her symptoms started on Sunday. Patient thinks that her asthma was triggered by pollen exposure. She spent most of the day outside. She reports right lower rib pain due to severe cough.  Hypertension-she is currently taking lisinopril 20 mg. It has been only 6 weeks of starting ACE inhibitor.  Review of Systems Negative for fever,  Negative for shortness of breath    Past Medical History  Diagnosis Date  . Asthma     Worse in winter  . Allergic rhinitis     Worse in winter , grasses  . Headache(784.0)   . SVT (supraventricular tachycardia)     No longer a problem  . HEMORRHAGIC CYSTITIS 05/25/2009    Qualifier: Diagnosis of  By: Regis Bill MD, Standley Brooking     History   Social History  . Marital Status: Married    Spouse Name: N/A    Number of Children: N/A  . Years of Education: N/A   Occupational History  . Not on file.   Social History Main Topics  . Smoking status: Never Smoker   . Smokeless tobacco: Not on file  . Alcohol Use: 0.0 oz/week    2-3 Glasses of wine per week  . Drug Use: Not on file  . Sexual Activity: Not on file   Other Topics Concern  . Not on file   Social History Narrative   Married   Employed [employer]  40 hours a week   HH of 2    Husband has a Economist   No ets   Drinks [employer]   Soc etoh only rare no tob ets    Past Surgical History  Procedure Laterality Date  . Cesarean section      x 2  . Oophorectomy      right  . Cervical spine surgery      Family History  Problem Relation Age of Onset  . Prostate cancer Father   . Other Mother     Died of asphyxiation    Allergies  Allergen Reactions  .  Codeine Anaphylaxis    Hallucinations    Current Outpatient Prescriptions on File Prior to Visit  Medication Sig Dispense Refill  . ADVAIR DISKUS 100-50 MCG/DOSE AEPB USE ONE INHALATION INTO THE LUNGS TWO TIMES A DAY OR AS DIRECTED  3 each  1  . albuterol (PROVENTIL HFA;VENTOLIN HFA) 108 (90 BASE) MCG/ACT inhaler Inhale 2 puffs into the lungs every 6 (six) hours as needed for wheezing.      . fluticasone (FLONASE) 50 MCG/ACT nasal spray Place 2 sprays into both nostrils daily. 2 each nostril  48 g  3  . lisinopril (PRINIVIL,ZESTRIL) 10 MG tablet Take 2 tablets (20 mg total) by mouth daily.  60 tablet  2  . metoprolol succinate (TOPROL-XL) 50 MG 24 hr tablet Take 1 tablet (50 mg total) by mouth daily. Take with or immediately following a meal.  90 tablet  3  . Multiple Vitamin (MULTI-VITAMIN PO) Take by mouth.       No current facility-administered medications on file prior to visit.    BP 142/90  Pulse 85  Temp(Src) 98.2 F (  36.8 C) (Oral)  Ht 5\' 4"  (1.626 m)  Wt 236 lb (107.049 kg)  BMI 40.49 kg/m2    Objective:   Physical Exam  Constitutional: She is oriented to person, place, and time. She appears well-developed and well-nourished.  HENT:  Head: Normocephalic and atraumatic.  Right Ear: External ear normal.  Left Ear: External ear normal.  Mouth/Throat: No oropharyngeal exudate.  Oropharyngeal erythema  Neck: Neck supple.  Cardiovascular: Normal rate, regular rhythm and normal heart sounds.   Pulmonary/Chest: Effort normal and breath sounds normal.  Faint upper airway wheezing  Lymphadenopathy:    She has no cervical adenopathy.  Neurological: She is alert and oriented to person, place, and time. No cranial nerve deficit.  Skin: Skin is warm and dry.  Psychiatric: She has a normal mood and affect. Her behavior is normal.          Assessment & Plan:

## 2013-11-25 NOTE — Assessment & Plan Note (Addendum)
53 year old white female experiencing asthma exacerbation. She does not have wheezing on exam. I suspect wheezing from upper airway. Her symptoms likely triggered by flare of allergic rhinitis. Treat with prednisone taper.  Use albuterol nebulizer 2.5 mg q 6 hrs as needed.  Patient advised to call office if symptoms persist or worsen.  Consider CXR and antibiotics if worsening symptoms.

## 2013-11-25 NOTE — Progress Notes (Signed)
Pre visit review using our clinic review tool, if applicable. No additional management support is needed unless otherwise documented below in the visit note. 

## 2013-11-29 ENCOUNTER — Telehealth: Payer: Self-pay | Admitting: Internal Medicine

## 2013-11-29 NOTE — Telephone Encounter (Signed)
Please get more information :? Fever ? Other sx.   Would be best to have Dr. Shawna Orleans also look at this note since he saw you    Usually antibiotics aren't  helpful  Early in  infection    May help i fSx lasting  A week or more .or relapsing    Make sure using decongestant  Saline nose spray and afrin (for no more than 3 days)  To relieve pressure . Contact us if not helping  In a few days

## 2013-11-29 NOTE — Telephone Encounter (Signed)
Pt saw dr Shawna Orleans on Fri. Pt states it has truned into sinus inf. reguesting antibotic. Pt has a lot of congestion, face is sore below eyes. pls advise Pharm: [facility]

## 2013-11-30 MED ORDER — DOXYCYCLINE HYCLATE 100 MG PO TABS: 100.0000 mg | ORAL_TABLET | Freq: Two times a day (BID) | ORAL | Status: DC

## 2013-11-30 NOTE — Telephone Encounter (Signed)
Will you please review before I call patient?

## 2013-11-30 NOTE — Telephone Encounter (Signed)
I called in doxycycline 100 mg to her pharm.  Take twice daily for 10 days.  Call back in not feeling better within 1 week

## 2013-11-30 NOTE — Telephone Encounter (Signed)
Left message on cell phone that rx was sent in to [facility]

## 2013-12-01 ENCOUNTER — Ambulatory Visit: Payer: BC Managed Care – PPO | Admitting: Internal Medicine

## 2014-01-25 ENCOUNTER — Encounter: Payer: Self-pay | Admitting: Internal Medicine

## 2014-02-16 ENCOUNTER — Other Ambulatory Visit: Payer: Self-pay | Admitting: Internal Medicine

## 2014-02-20 NOTE — Telephone Encounter (Signed)
Left a message for the pt to return my call.  She needs a follow up and lab work appointment.

## 2014-03-01 ENCOUNTER — Ambulatory Visit (INDEPENDENT_AMBULATORY_CARE_PROVIDER_SITE_OTHER): Payer: BC Managed Care – PPO | Admitting: Family Medicine

## 2014-03-01 ENCOUNTER — Encounter: Payer: Self-pay | Admitting: Family Medicine

## 2014-03-01 VITALS — BP 162/103 | HR 91 | Temp 99.3°F | Ht 64.0 in | Wt 245.0 lb

## 2014-03-01 DIAGNOSIS — J40 Bronchitis, not specified as acute or chronic: Secondary | ICD-10-CM

## 2014-03-01 MED ORDER — HYDROCODONE-HOMATROPINE 5-1.5 MG/5ML PO SYRP: 5.0000 mL | ORAL_SOLUTION | ORAL | Status: DC | PRN

## 2014-03-01 MED ORDER — AZITHROMYCIN 250 MG PO TABS: ORAL_TABLET | ORAL | Status: DC

## 2014-03-01 NOTE — Progress Notes (Signed)
Pre visit review using our clinic review tool, if applicable. No additional management support is needed unless otherwise documented below in the visit note. 

## 2014-03-01 NOTE — Progress Notes (Signed)
   Subjective:    Patient ID: Danielle Martin, female    DOB: 1961-04-25, 53 y.o.   MRN: [id number]  HPI Here for 5 days of chest tightness and coughing up green sputum. No fever.    Review of Systems  Constitutional: Negative.   HENT: Positive for congestion.   Eyes: Negative.   Respiratory: Positive for cough, chest tightness and wheezing. Negative for shortness of breath.   Cardiovascular: Negative.        Objective:   Physical Exam  Constitutional: She appears well-developed and well-nourished.  HENT:  Right Ear: External ear normal.  Left Ear: External ear normal.  Nose: Nose normal.  Mouth/Throat: Oropharynx is clear and moist.  Eyes: Conjunctivae are normal.  Pulmonary/Chest: Effort normal. No respiratory distress. She has no wheezes. She has no rales.  Scattered rhonchi   Lymphadenopathy:    She has no cervical adenopathy.          Assessment & Plan:  Given a Zpack. Use the inhaler prn

## 2014-11-13 ENCOUNTER — Other Ambulatory Visit (INDEPENDENT_AMBULATORY_CARE_PROVIDER_SITE_OTHER): Payer: 59

## 2014-11-13 DIAGNOSIS — Z0000 Encounter for general adult medical examination without abnormal findings: Secondary | ICD-10-CM | POA: Diagnosis not present

## 2014-11-13 LAB — CBC WITH DIFFERENTIAL/PLATELET
Basophils Absolute: 0 10*3/uL (ref 0.0–0.1)
Basophils Relative: 0.6 % (ref 0.0–3.0)
Eosinophils Absolute: 0.6 10*3/uL (ref 0.0–0.7)
Eosinophils Relative: 7.8 % — ABNORMAL HIGH (ref 0.0–5.0)
HEMATOCRIT: 39.8 % (ref 36.0–46.0)
HEMOGLOBIN: 13.2 g/dL (ref 12.0–15.0)
Lymphocytes Relative: 23 % (ref 12.0–46.0)
Lymphs Abs: 1.6 10*3/uL (ref 0.7–4.0)
MCHC: 33.3 g/dL (ref 30.0–36.0)
MCV: 81.2 fl (ref 78.0–100.0)
Monocytes Absolute: 0.4 10*3/uL (ref 0.1–1.0)
Monocytes Relative: 5.4 % (ref 3.0–12.0)
Neutro Abs: 4.5 10*3/uL (ref 1.4–7.7)
Neutrophils Relative %: 63.2 % (ref 43.0–77.0)
Platelets: 494 10*3/uL — ABNORMAL HIGH (ref 150.0–400.0)
RBC: 4.9 Mil/uL (ref 3.87–5.11)
RDW: 14.5 % (ref 11.5–15.5)
WBC: 7.1 10*3/uL (ref 4.0–10.5)

## 2014-11-13 LAB — COMPREHENSIVE METABOLIC PANEL
ALBUMIN: 4 g/dL (ref 3.5–5.2)
ALT: 22 U/L (ref 0–35)
AST: 19 U/L (ref 0–37)
Alkaline Phosphatase: 50 U/L (ref 39–117)
BUN: 20 mg/dL (ref 6–23)
CALCIUM: 9.1 mg/dL (ref 8.4–10.5)
CHLORIDE: 101 meq/L (ref 96–112)
CO2: 29 meq/L (ref 19–32)
Creatinine, Ser: 0.74 mg/dL (ref 0.40–1.20)
GFR: 87.69 mL/min (ref >60.00)
GLUCOSE: 99 mg/dL (ref 70–99)
Potassium: 4.1 mEq/L (ref 3.5–5.1)
Sodium: 138 mEq/L (ref 135–145)
Total Bilirubin: 0.6 mg/dL (ref 0.2–1.2)
Total Protein: 7.1 g/dL (ref 6.0–8.3)

## 2014-11-13 LAB — LIPID PANEL
CHOL/HDL RATIO: 4
Cholesterol: 176 mg/dL (ref 0–200)
HDL: 46 mg/dL (ref >39.00)
LDL Cholesterol: 95 mg/dL (ref 0–99)
NONHDL: 130
Triglycerides: 177 mg/dL — ABNORMAL HIGH (ref 0.0–149.0)
VLDL: 35.4 mg/dL (ref 0.0–40.0)

## 2014-11-13 LAB — TSH: TSH: 1.71 u[IU]/mL (ref 0.35–4.50)

## 2014-11-20 ENCOUNTER — Encounter: Payer: Self-pay | Admitting: Internal Medicine

## 2014-11-20 ENCOUNTER — Ambulatory Visit (INDEPENDENT_AMBULATORY_CARE_PROVIDER_SITE_OTHER): Payer: 59 | Admitting: Internal Medicine

## 2014-11-20 VITALS — BP 152/92 | Temp 97.3°F | Ht 64.0 in | Wt 240.9 lb

## 2014-11-20 DIAGNOSIS — Z0000 Encounter for general adult medical examination without abnormal findings: Secondary | ICD-10-CM | POA: Diagnosis not present

## 2014-11-20 DIAGNOSIS — D473 Essential (hemorrhagic) thrombocythemia: Secondary | ICD-10-CM

## 2014-11-20 DIAGNOSIS — I10 Essential (primary) hypertension: Secondary | ICD-10-CM | POA: Diagnosis not present

## 2014-11-20 DIAGNOSIS — D75839 Thrombocytosis, unspecified: Secondary | ICD-10-CM

## 2014-11-20 DIAGNOSIS — Z79899 Other long term (current) drug therapy: Secondary | ICD-10-CM | POA: Diagnosis not present

## 2014-11-20 DIAGNOSIS — I471 Supraventricular tachycardia: Secondary | ICD-10-CM

## 2014-11-20 MED ORDER — LISINOPRIL 10 MG PO TABS: 10.0000 mg | ORAL_TABLET | Freq: Every day | ORAL | Status: DC

## 2014-11-20 MED ORDER — METOPROLOL SUCCINATE ER 50 MG PO TB24: 50.0000 mg | ORAL_TABLET | Freq: Every day | ORAL | Status: DC

## 2014-11-20 NOTE — Patient Instructions (Signed)
Blood pressure is   too high  Continue metoprolol  And add ACEinhibitor  Lisinopril 10 mg per day  May need to adjust med or change around until controlled  Healthy weight loss will also help. Will be contacted about dietary nutrition referral. Will arrange a consult with hematology about elevated platelet count conintuing.       Why follow it? Research shows. . Those who follow the Mediterranean diet have a reduced risk of heart disease  . The diet is associated with a reduced incidence of Parkinson's and Alzheimer's diseases . People following the diet may have longer life expectancies and lower rates of chronic diseases  . The Dietary Guidelines for Americans recommends the Mediterranean diet as an eating plan to promote health and prevent disease  What Is the Mediterranean Diet?  . Healthy eating plan based on typical foods and recipes of Mediterranean-style cooking . The diet is primarily a plant based diet; these foods should make up a majority of meals   Starches - Plant based foods should make up a majority of meals - They are an important sources of vitamins, minerals, energy, antioxidants, and fiber - Choose whole grains, foods high in fiber and minimally processed items  - Typical grain sources include wheat, oats, barley, corn, brown rice, bulgar, farro, millet, polenta, couscous  - Various types of beans include chickpeas, lentils, fava beans, black beans, white beans   Fruits  Veggies - Large quantities of antioxidant rich fruits & veggies; 6 or more servings  - Vegetables can be eaten raw or lightly drizzled with oil and cooked  - Vegetables common to the traditional Mediterranean Diet include: artichokes, arugula, beets, broccoli, brussel sprouts, cabbage, carrots, celery, collard greens, cucumbers, eggplant, kale, leeks, lemons, lettuce, mushrooms, okra, onions, peas, peppers, potatoes, pumpkin, radishes, rutabaga, shallots, spinach, sweet potatoes, turnips, zucchini -  Fruits common to the Mediterranean Diet include: apples, apricots, avocados, cherries, clementines, dates, figs, grapefruits, grapes, melons, nectarines, oranges, peaches, pears, pomegranates, strawberries, tangerines  Fats - Replace butter and margarine with healthy oils, such as olive oil, canola oil, and tahini  - Limit nuts to no more than a handful a day  - Nuts include walnuts, almonds, pecans, pistachios, pine nuts  - Limit or avoid candied, honey roasted or heavily salted nuts - Olives are central to the [employer] - can be eaten whole or used in a variety of dishes   Meats Protein - Limiting red meat: no more than a few times a month - When eating red meat: choose lean cuts and keep the portion to the size of deck of cards - Eggs: approx. 0 to 4 times a week  - Fish and lean poultry: at least 2 a week  - Healthy protein sources include, chicken, [place], lean beef, lamb - Increase intake of seafood such as tuna, salmon, trout, mackerel, shrimp, scallops - Avoid or limit high fat processed meats such as sausage and bacon  Dairy - Include moderate amounts of low fat dairy products  - Focus on healthy dairy such as fat free yogurt, skim milk, low or reduced fat cheese - Limit dairy products higher in fat such as whole or 2% milk, cheese, ice cream  Alcohol - Moderate amounts of red wine is ok  - No more than 5 oz daily for women (all ages) and men older than age 57  - No more than 10 oz of wine daily for men younger than 26  Other - Limit sweets and  other desserts  - Use herbs and spices instead of salt to flavor foods  - Herbs and spices common to the traditional Mediterranean Diet include: basil, bay leaves, chives, cloves, cumin, fennel, garlic, lavender, marjoram, mint, oregano, parsley, pepper, rosemary, sage, savory, sumac, tarragon, thyme   It's not just a diet, it's a lifestyle:  . The Mediterranean diet includes lifestyle factors typical of those in the region  . Foods,  drinks and meals are best eaten with others and savored . Daily physical activity is important for overall good health . This could be strenuous exercise like running and aerobics . This could also be more leisurely activities such as walking, housework, yard-work, or taking the stairs . Moderation is the key; a balanced and healthy diet accommodates most foods and drinks . Consider portion sizes and frequency of consumption of certain foods   Meal Ideas & Options:  . Breakfast:  o Whole wheat toast or whole wheat English muffins with peanut butter & hard boiled egg o Steel cut oats topped with apples & cinnamon and skim milk  o Fresh fruit: banana, strawberries, melon, berries, peaches  o Smoothies: strawberries, bananas, greek yogurt, peanut butter o Low fat greek yogurt with blueberries and granola  o Egg white omelet with spinach and mushrooms o Breakfast couscous: whole wheat couscous, apricots, skim milk, cranberries  . Sandwiches:  o Hummus and grilled vegetables (peppers, zucchini, squash) on whole wheat bread   o Grilled chicken on whole wheat pita with lettuce, tomatoes, cucumbers or tzatziki  o Tuna salad on whole wheat bread: tuna salad made with greek yogurt, olives, red peppers, capers, green onions o Garlic rosemary lamb pita: lamb sauted with garlic, rosemary, salt & pepper; add lettuce, cucumber, greek yogurt to pita - flavor with lemon juice and black pepper  . Seafood:  o Mediterranean grilled salmon, seasoned with garlic, basil, parsley, lemon juice and black pepper o Shrimp, lemon, and spinach whole-grain pasta salad made with low fat greek yogurt  o Seared scallops with lemon orzo  o Seared tuna steaks seasoned salt, pepper, coriander topped with tomato mixture of olives, tomatoes, olive oil, minced garlic, parsley, green onions and cappers  . Meats:  o Herbed greek chicken salad with kalamata olives, cucumber, feta  o Red bell peppers stuffed with spinach, bulgur,  lean ground beef (or lentils) & topped with feta   o Kebabs: skewers of chicken, tomatoes, onions, zucchini, squash  o [place] burgers: made with red onions, mint, dill, lemon juice, feta cheese topped with roasted red peppers . Vegetarian o Cucumber salad: cucumbers, artichoke hearts, celery, red onion, feta cheese, tossed in olive oil & lemon juice  o Hummus and whole grain pita points with a greek salad (lettuce, tomato, feta, olives, cucumbers, red onion) o Lentil soup with celery, carrots made with vegetable broth, garlic, salt and pepper  o Tabouli salad: parsley, bulgur, mint, scallions, cucumbers, tomato, radishes, lemon juice, olive oil, salt and pepper.     DASH Eating Plan DASH stands for "Dietary Approaches to Stop Hypertension." The DASH eating plan is a healthy eating plan that has been shown to reduce high blood pressure (hypertension). Additional health benefits may include reducing the risk of type 2 diabetes mellitus, heart disease, and stroke. The DASH eating plan may also help with weight loss. WHAT DO I NEED TO KNOW ABOUT THE DASH EATING PLAN? For the DASH eating plan, you will follow these general guidelines:  Choose foods with a percent daily value for  sodium of less than 5% (as listed on the food label).  Use salt-free seasonings or herbs instead of table salt or sea salt.  Check with your health care provider or pharmacist before using salt substitutes.  Eat lower-sodium products, often labeled as "lower sodium" or "no salt added."  Eat fresh foods.  Eat more vegetables, fruits, and low-fat dairy products.  Choose whole grains. Look for the word "whole" as the first word in the ingredient list.  Choose fish and skinless chicken or [place] more often than red meat. Limit fish, poultry, and meat to 6 oz (170 g) each day.  Limit sweets, desserts, sugars, and sugary drinks.  Choose heart-healthy fats.  Limit cheese to 1 oz (28 g) per day.  Eat more home-cooked  food and less restaurant, buffet, and fast food.  Limit fried foods.  Cook foods using methods other than frying.  Limit canned vegetables. If you do use them, rinse them well to decrease the sodium.  When eating at a restaurant, ask that your food be prepared with less salt, or no salt if possible. WHAT FOODS CAN I EAT? Seek help from a dietitian for individual calorie needs. Grains Whole grain or whole wheat bread. Brown rice. Whole grain or whole wheat pasta. Quinoa, bulgur, and whole grain cereals. Low-sodium cereals. Corn or whole wheat flour tortillas. Whole grain cornbread. Whole grain crackers. Low-sodium crackers. Vegetables Fresh or frozen vegetables (raw, steamed, roasted, or grilled). Low-sodium or reduced-sodium tomato and vegetable juices. Low-sodium or reduced-sodium tomato sauce and paste. Low-sodium or reduced-sodium canned vegetables.  Fruits All fresh, canned (in natural juice), or frozen fruits. Meat and Other Protein Products Ground beef (85% or leaner), grass-fed beef, or beef trimmed of fat. Skinless chicken or [place]. Ground chicken or [place]. Pork trimmed of fat. All fish and seafood. Eggs. Dried beans, peas, or lentils. Unsalted nuts and seeds. Unsalted canned beans. Dairy Low-fat dairy products, such as skim or 1% milk, 2% or reduced-fat cheeses, low-fat ricotta or cottage cheese, or plain low-fat yogurt. Low-sodium or reduced-sodium cheeses. Fats and Oils Tub margarines without trans fats. Light or reduced-fat mayonnaise and salad dressings (reduced sodium). Avocado. Safflower, olive, or canola oils. Natural peanut or almond butter. Other Unsalted popcorn and pretzels. The items listed above may not be a complete list of recommended foods or beverages. Contact your dietitian for more options. WHAT FOODS ARE NOT RECOMMENDED? Grains White bread. White pasta. White rice. Refined cornbread. Bagels and croissants. Crackers that contain trans  fat. Vegetables Creamed or fried vegetables. Vegetables in a cheese sauce. Regular canned vegetables. Regular canned tomato sauce and paste. Regular tomato and vegetable juices. Fruits Dried fruits. Canned fruit in light or heavy syrup. Fruit juice. Meat and Other Protein Products Fatty cuts of meat. Ribs, chicken wings, bacon, sausage, bologna, salami, chitterlings, fatback, hot dogs, bratwurst, and packaged luncheon meats. Salted nuts and seeds. Canned beans with salt. Dairy Whole or 2% milk, cream, half-and-half, and cream cheese. Whole-fat or sweetened yogurt. Full-fat cheeses or blue cheese. Nondairy creamers and whipped toppings. Processed cheese, cheese spreads, or cheese curds. Condiments Onion and garlic salt, seasoned salt, table salt, and sea salt. Canned and packaged gravies. Worcestershire sauce. Tartar sauce. Barbecue sauce. Teriyaki sauce. Soy sauce, including reduced sodium. Steak sauce. Fish sauce. Oyster sauce. Cocktail sauce. Horseradish. Ketchup and mustard. Meat flavorings and tenderizers. Bouillon cubes. Hot sauce. Tabasco sauce. Marinades. Taco seasonings. Relishes. Fats and Oils Butter, stick margarine, lard, shortening, ghee, and bacon fat. Coconut, palm kernel, or  palm oils. Regular salad dressings. Other Pickles and olives. Salted popcorn and pretzels. The items listed above may not be a complete list of foods and beverages to avoid. Contact your dietitian for more information. WHERE CAN I FIND MORE INFORMATION? National Heart, [employer], [employer] Institute: travelstabloid.com Document Released: 07/24/2011 Document Revised: 12/19/2013 Document Reviewed: 06/08/2013 [facility] Patient Information 2015 [place], [place]. This information is not intended to replace advice given to you by your health care provider. Make sure you discuss any questions you have with your health care provider.

## 2014-11-20 NOTE — Progress Notes (Signed)
Pre visit review using our clinic review tool, if applicable. No additional management support is needed unless otherwise documented below in the visit note.  Chief Complaint  Patient presents with  . Annual Exam    hard to lose weight   . Hypertension    HPI: Patient  Danielle Martin  54 y.o. comes in today for Preventive Health Care visit  And other med problems frustrated bp not coming down and hard to lose weight, bp in 148/90 + readings  No osa  Asthma stable no problems  no bruising bleeding  Health Maintenance  Topic Date Due  . MAMMOGRAM  02/22/2013  . PAP SMEAR  12/25/2013  . HIV Screening  11/19/2015 (Originally 02/22/1976)  . INFLUENZA VACCINE  03/19/2015  . TETANUS/TDAP  12/24/2020  . COLONOSCOPY  02/19/2021   Health Maintenance Review LIFESTYLE:  Exercise:    hh of 2  Biking stationary  Tobacco/ETS: no Alcohol:  S1-2 per week  Sugar beverages: no Sleep: about 6  Drug use: no Colonoscopy:  utd  PAP: last pap couple of years   MAMMO: to get   Had at gyne in past .  Cramps in toes after exercise machine   ROS:  GEN/ HEENT: No fever, significant weight changes sweats headaches vision problems hearing changes, CV/ PULM; No chest pain shortness of breath cough, syncope,edema  change in exercise tolerance. GI /GU: No adominal pain, vomiting, change in bowel habits. No blood in the stool. No significant GU symptoms. SKIN/HEME: ,no acute skin rashes suspicious lesions or bleeding. No lymphadenopathy, nodules, masses.  NEURO/ PSYCH:  No neurologic signs such as weakness numbness. No depression anxiety. IMM/ Allergy: No unusual infections.  Allergy .   REST of 12 system review negative except as per HPI   Past Medical History  Diagnosis Date  . Asthma     Worse in winter  . Allergic rhinitis     Worse in winter , grasses  . Headache(784.0)   . SVT (supraventricular tachycardia)     No longer a problem  . HEMORRHAGIC CYSTITIS 05/25/2009    Qualifier: Diagnosis of   By: Regis Bill MD, Standley Brooking     Past Surgical History  Procedure Laterality Date  . Cesarean section      x 2  . Oophorectomy      right  . Cervical spine surgery      Family History  Problem Relation Age of Onset  . Prostate cancer Father   . Other Mother     Died of asphyxiation    History   Social History  . Marital Status: Married    Spouse Name: N/A  . Number of Children: N/A  . Years of Education: N/A   Social History Main Topics  . Smoking status: Never Smoker   . Smokeless tobacco: Never Used  . Alcohol Use: 0.0 oz/week    2-3 Glasses of wine per week  . Drug Use: No  . Sexual Activity: Not on file   Other Topics Concern  . None   Social History Narrative   Married   Employed [employer]  40 hours a week   HH of 2    Husband has a Economist   No ets   Drinks [employer]   Soc etoh only rare no tob ets    Outpatient Encounter Prescriptions as of 11/20/2014  Medication Sig  . ADVAIR DISKUS 100-50 MCG/DOSE AEPB USE ONE INHALATION INTO THE  LUNGS TWO TIMES A DAY OR AS DIRECTED  . albuterol (PROVENTIL HFA;VENTOLIN HFA) 108 (90 BASE) MCG/ACT inhaler Inhale 2 puffs into the lungs every 6 (six) hours as needed for wheezing.  . metoprolol succinate (TOPROL-XL) 50 MG 24 hr tablet Take 1 tablet (50 mg total) by mouth daily. Take with or immediately following a meal.  . Multiple Vitamin (MULTI-VITAMIN PO) Take by mouth.  . [DISCONTINUED] metoprolol succinate (TOPROL-XL) 50 MG 24 hr tablet Take 1 tablet (50 mg total) by mouth daily. Take with or immediately following a meal.  . albuterol (PROVENTIL) (2.5 MG/3ML) 0.083% nebulizer solution Take 3 mLs (2.5 mg total) by nebulization every 6 (six) hours as needed for wheezing or shortness of breath. (Patient not taking: Reported on 11/20/2014)  . fluticasone (FLONASE) 50 MCG/ACT nasal spray Place 2 sprays into both nostrils daily. 2 each nostril  . lisinopril (PRINIVIL,ZESTRIL) 10 MG tablet Take 1 tablet  (10 mg total) by mouth daily.  . [DISCONTINUED] azithromycin (ZITHROMAX) 250 MG tablet As directed  . [DISCONTINUED] doxycycline (VIBRA-TABS) 100 MG tablet Take 1 tablet (100 mg total) by mouth 2 (two) times daily.  . [DISCONTINUED] HYDROcodone-homatropine (HYDROMET) 5-1.5 MG/5ML syrup Take 5 mLs by mouth every 4 (four) hours as needed.  . [DISCONTINUED] predniSONE (DELTASONE) 20 MG tablet 1 tablet twice daily for 3 days, then 1/2 tab twice daily for 3 days, then 1/2 tab once daily for 4 days  . [DISCONTINUED] valsartan (DIOVAN) 160 MG tablet TAKE 1 TABLET (160 MG TOTAL) BY MOUTH DAILY.    EXAM:  BP 152/92 mmHg  Temp(Src) 97.3 F (36.3 C) (Oral)  Ht 5\' 4"  (1.626 m)  Wt 240 lb 14.4 oz (109.272 kg)  BMI 41.33 kg/m2  Body mass index is 41.33 kg/(m^2).  Physical Exam: Vital signs reviewed JXB:JYNW is a well-developed well-nourished alert cooperative    who appearsr stated age in no acute distress.  HEENT: normocephalic atraumatic , Eyes: PERRL EOM's full, conjunctiva clear, Nares: paten,t no deformity discharge or tenderness., Ears: no deformity EAC's clear TMs with normal landmarks. Mouth: clear OP, no lesions, edema.  Moist mucous membranes. Dentition in adequate repair. NECK: supple without masses, thyromegaly or bruits. CHEST/PULM:  Clear to auscultation and percussion breath sounds equal no wheeze , rales or rhonchi. No chest wall deformities or tenderness. Breast: normal by inspection . No dimpling, discharge, masses, tenderness or discharge . CV: PMI is nondisplaced, S1 S2 no gallops, murmurs, rubs. Peripheral pulses are full without delay.No JVD .  ABDOMEN: Bowel sounds normal nontender  No guard or rebound, no hepato splenomegal no CVA tenderness.  No hernia. Extremtities:  No clubbing cyanosis or edema, no acute joint swelling or redness no focal atrophy NEURO:  Oriented x3, cranial nerves 3-12 appear to be intact, no obvious focal weakness,gait within normal limits no abnormal  reflexes or asymmetrical SKIN: No acute rashes normal turgor, color, no bruising or petechiae. PSYCH: Oriented, good eye contact, no obvious depression anxiety, cognition and judgment appear normal. LN: no cervical axillary inguinal adenopathy Pelvic nl ext gu  Cuff clear nl color  No masses on bimanual.   Lab Results  Component Value Date   WBC 7.1 11/13/2014   HGB 13.2 11/13/2014   HCT 39.8 11/13/2014   PLT 494.0* 11/13/2014   GLUCOSE 99 11/13/2014   CHOL 176 11/13/2014   TRIG 177.0* 11/13/2014   HDL 46.00 11/13/2014   LDLCALC 95 11/13/2014   ALT 22 11/13/2014   AST 19 11/13/2014   NA 138 11/13/2014  K 4.1 11/13/2014   CL 101 11/13/2014   CREATININE 0.74 11/13/2014   BUN 20 11/13/2014   CO2 29 11/13/2014   TSH 1.71 11/13/2014   INR 1.0 01/24/2009   BP Readings from Last 3 Encounters:  11/20/14 152/92  03/01/14 162/103  11/25/13 142/90   Wt Readings from Last 3 Encounters:  11/20/14 240 lb 14.4 oz (109.272 kg)  03/01/14 245 lb (111.131 kg)  11/25/13 236 lb (107.049 kg)  bp right 152/92   ASSESSMENT AND PLAN:  Discussed the following assessment and plan:  Visit for preventive health examination  Essential hypertension - has been up at home . intensify lsi and add acei  fu 2-3 mos - Plan: Amb ref to Medical Nutrition Therapy-MNT  Medication management  Thrombocytosis - continues  refer to hem - Plan: Ambulatory referral to Hematology  Severe obesity (BMI >= 40) - counseled  nutritiondietician referral also  - Plan: Amb ref to Medical Nutrition Therapy-MNT  SVT (supraventricular tachycardia) rare recurrance  - controlled on b blocker   Patient Care Team: Burnis Medin, MD as PCP - General Arvella Nigh, MD (Obstetrics and Gynecology) Marybelle Killings, MD as Attending Physician (Orthopedic Surgery) Patient Instructions   Blood pressure is   too high  Continue metoprolol  And add ACEinhibitor  Lisinopril 10 mg per day  May need to adjust med or change around  until controlled  Healthy weight loss will also help. Will be contacted about dietary nutrition referral. Will arrange a consult with hematology about elevated platelet count conintuing.       Why follow it? Research shows. . Those who follow the Mediterranean diet have a reduced risk of heart disease  . The diet is associated with a reduced incidence of Parkinson's and Alzheimer's diseases . People following the diet may have longer life expectancies and lower rates of chronic diseases  . The Dietary Guidelines for Americans recommends the Mediterranean diet as an eating plan to promote health and prevent disease  What Is the Mediterranean Diet?  . Healthy eating plan based on typical foods and recipes of Mediterranean-style cooking . The diet is primarily a plant based diet; these foods should make up a majority of meals   Starches - Plant based foods should make up a majority of meals - They are an important sources of vitamins, minerals, energy, antioxidants, and fiber - Choose whole grains, foods high in fiber and minimally processed items  - Typical grain sources include wheat, oats, barley, corn, brown rice, bulgar, farro, millet, polenta, couscous  - Various types of beans include chickpeas, lentils, fava beans, black beans, white beans   Fruits  Veggies - Large quantities of antioxidant rich fruits & veggies; 6 or more servings  - Vegetables can be eaten raw or lightly drizzled with oil and cooked  - Vegetables common to the traditional Mediterranean Diet include: artichokes, arugula, beets, broccoli, brussel sprouts, cabbage, carrots, celery, collard greens, cucumbers, eggplant, kale, leeks, lemons, lettuce, mushrooms, okra, onions, peas, peppers, potatoes, pumpkin, radishes, rutabaga, shallots, spinach, sweet potatoes, turnips, zucchini - Fruits common to the Mediterranean Diet include: apples, apricots, avocados, cherries, clementines, dates, figs, grapefruits, grapes, melons,  nectarines, oranges, peaches, pears, pomegranates, strawberries, tangerines  Fats - Replace butter and margarine with healthy oils, such as olive oil, canola oil, and tahini  - Limit nuts to no more than a handful a day  - Nuts include walnuts, almonds, pecans, pistachios, pine nuts  - Limit or avoid candied, honey roasted  or heavily salted nuts - Olives are central to the Mediterranean diet - can be eaten whole or used in a variety of dishes   Meats Protein - Limiting red meat: no more than a few times a month - When eating red meat: choose lean cuts and keep the portion to the size of deck of cards - Eggs: approx. 0 to 4 times a week  - Fish and lean poultry: at least 2 a week  - Healthy protein sources include, chicken, [place], lean beef, lamb - Increase intake of seafood such as tuna, salmon, trout, mackerel, shrimp, scallops - Avoid or limit high fat processed meats such as sausage and bacon  Dairy - Include moderate amounts of low fat dairy products  - Focus on healthy dairy such as fat free yogurt, skim milk, low or reduced fat cheese - Limit dairy products higher in fat such as whole or 2% milk, cheese, ice cream  Alcohol - Moderate amounts of red wine is ok  - No more than 5 oz daily for women (all ages) and men older than age 66  - No more than 10 oz of wine daily for men younger than 3  Other - Limit sweets and other desserts  - Use herbs and spices instead of salt to flavor foods  - Herbs and spices common to the traditional Mediterranean Diet include: basil, bay leaves, chives, cloves, cumin, fennel, garlic, lavender, marjoram, mint, oregano, parsley, pepper, rosemary, sage, savory, sumac, tarragon, thyme   It's not just a diet, it's a lifestyle:  . The Mediterranean diet includes lifestyle factors typical of those in the region  . Foods, drinks and meals are best eaten with others and savored . Daily physical activity is important for overall good health . This could be  strenuous exercise like running and aerobics . This could also be more leisurely activities such as walking, housework, yard-work, or taking the stairs . Moderation is the key; a balanced and healthy diet accommodates most foods and drinks . Consider portion sizes and frequency of consumption of certain foods   Meal Ideas & Options:  . Breakfast:  o Whole wheat toast or whole wheat English muffins with peanut butter & hard boiled egg o Steel cut oats topped with apples & cinnamon and skim milk  o Fresh fruit: banana, strawberries, melon, berries, peaches  o Smoothies: strawberries, bananas, greek yogurt, peanut butter o Low fat greek yogurt with blueberries and granola  o Egg white omelet with spinach and mushrooms o Breakfast couscous: whole wheat couscous, apricots, skim milk, cranberries  . Sandwiches:  o Hummus and grilled vegetables (peppers, zucchini, squash) on whole wheat bread   o Grilled chicken on whole wheat pita with lettuce, tomatoes, cucumbers or tzatziki  o Tuna salad on whole wheat bread: tuna salad made with greek yogurt, olives, red peppers, capers, green onions o Garlic rosemary lamb pita: lamb sauted with garlic, rosemary, salt & pepper; add lettuce, cucumber, greek yogurt to pita - flavor with lemon juice and black pepper  . Seafood:  o Mediterranean grilled salmon, seasoned with garlic, basil, parsley, lemon juice and black pepper o Shrimp, lemon, and spinach whole-grain pasta salad made with low fat greek yogurt  o Seared scallops with lemon orzo  o Seared tuna steaks seasoned salt, pepper, coriander topped with tomato mixture of olives, tomatoes, olive oil, minced garlic, parsley, green onions and cappers  . Meats:  o Herbed greek chicken salad with kalamata olives, cucumber, feta  o  Red bell peppers stuffed with spinach, bulgur, lean ground beef (or lentils) & topped with feta   o Kebabs: skewers of chicken, tomatoes, onions, zucchini, squash  o [place] burgers:  made with red onions, mint, dill, lemon juice, feta cheese topped with roasted red peppers . Vegetarian o Cucumber salad: cucumbers, artichoke hearts, celery, red onion, feta cheese, tossed in olive oil & lemon juice  o Hummus and whole grain pita points with a greek salad (lettuce, tomato, feta, olives, cucumbers, red onion) o Lentil soup with celery, carrots made with vegetable broth, garlic, salt and pepper  o Tabouli salad: parsley, bulgur, mint, scallions, cucumbers, tomato, radishes, lemon juice, olive oil, salt and pepper.     DASH Eating Plan DASH stands for "Dietary Approaches to Stop Hypertension." The DASH eating plan is a healthy eating plan that has been shown to reduce high blood pressure (hypertension). Additional health benefits may include reducing the risk of type 2 diabetes mellitus, heart disease, and stroke. The DASH eating plan may also help with weight loss. WHAT DO I NEED TO KNOW ABOUT THE DASH EATING PLAN? For the DASH eating plan, you will follow these general guidelines:  Choose foods with a percent daily value for sodium of less than 5% (as listed on the food label).  Use salt-free seasonings or herbs instead of table salt or sea salt.  Check with your health care provider or pharmacist before using salt substitutes.  Eat lower-sodium products, often labeled as "lower sodium" or "no salt added."  Eat fresh foods.  Eat more vegetables, fruits, and low-fat dairy products.  Choose whole grains. Look for the word "whole" as the first word in the ingredient list.  Choose fish and skinless chicken or [place] more often than red meat. Limit fish, poultry, and meat to 6 oz (170 g) each day.  Limit sweets, desserts, sugars, and sugary drinks.  Choose heart-healthy fats.  Limit cheese to 1 oz (28 g) per day.  Eat more home-cooked food and less restaurant, buffet, and fast food.  Limit fried foods.  Cook foods using methods other than frying.  Limit canned  vegetables. If you do use them, rinse them well to decrease the sodium.  When eating at a restaurant, ask that your food be prepared with less salt, or no salt if possible. WHAT FOODS CAN I EAT? Seek help from a dietitian for individual calorie needs. Grains Whole grain or whole wheat bread. Brown rice. Whole grain or whole wheat pasta. Quinoa, bulgur, and whole grain cereals. Low-sodium cereals. Corn or whole wheat flour tortillas. Whole grain cornbread. Whole grain crackers. Low-sodium crackers. Vegetables Fresh or frozen vegetables (raw, steamed, roasted, or grilled). Low-sodium or reduced-sodium tomato and vegetable juices. Low-sodium or reduced-sodium tomato sauce and paste. Low-sodium or reduced-sodium canned vegetables.  Fruits All fresh, canned (in natural juice), or frozen fruits. Meat and Other Protein Products Ground beef (85% or leaner), grass-fed beef, or beef trimmed of fat. Skinless chicken or [place]. Ground chicken or [place]. Pork trimmed of fat. All fish and seafood. Eggs. Dried beans, peas, or lentils. Unsalted nuts and seeds. Unsalted canned beans. Dairy Low-fat dairy products, such as skim or 1% milk, 2% or reduced-fat cheeses, low-fat ricotta or cottage cheese, or plain low-fat yogurt. Low-sodium or reduced-sodium cheeses. Fats and Oils Tub margarines without trans fats. Light or reduced-fat mayonnaise and salad dressings (reduced sodium). Avocado. Safflower, olive, or canola oils. Natural peanut or almond butter. Other Unsalted popcorn and pretzels. The items listed above may  not be a complete list of recommended foods or beverages. Contact your dietitian for more options. WHAT FOODS ARE NOT RECOMMENDED? Grains White bread. White pasta. White rice. Refined cornbread. Bagels and croissants. Crackers that contain trans fat. Vegetables Creamed or fried vegetables. Vegetables in a cheese sauce. Regular canned vegetables. Regular canned tomato sauce and paste. Regular tomato  and vegetable juices. Fruits Dried fruits. Canned fruit in light or heavy syrup. Fruit juice. Meat and Other Protein Products Fatty cuts of meat. Ribs, chicken wings, bacon, sausage, bologna, salami, chitterlings, fatback, hot dogs, bratwurst, and packaged luncheon meats. Salted nuts and seeds. Canned beans with salt. Dairy Whole or 2% milk, cream, half-and-half, and cream cheese. Whole-fat or sweetened yogurt. Full-fat cheeses or blue cheese. Nondairy creamers and whipped toppings. Processed cheese, cheese spreads, or cheese curds. Condiments Onion and garlic salt, seasoned salt, table salt, and sea salt. Canned and packaged gravies. Worcestershire sauce. Tartar sauce. Barbecue sauce. Teriyaki sauce. Soy sauce, including reduced sodium. Steak sauce. Fish sauce. Oyster sauce. Cocktail sauce. Horseradish. Ketchup and mustard. Meat flavorings and tenderizers. Bouillon cubes. Hot sauce. Tabasco sauce. Marinades. Taco seasonings. Relishes. Fats and Oils Butter, stick margarine, lard, shortening, ghee, and bacon fat. Coconut, palm kernel, or palm oils. Regular salad dressings. Other Pickles and olives. Salted popcorn and pretzels. The items listed above may not be a complete list of foods and beverages to avoid. Contact your dietitian for more information. WHERE CAN I FIND MORE INFORMATION? National Heart, Lung, and Blood Institute: travelstabloid.com Document Released: 07/24/2011 Document Revised: 12/19/2013 Document Reviewed: 06/08/2013 [facility] Patient Information 2015 [place], [place]. This information is not intended to replace advice given to you by your health care provider. Make sure you discuss any questions you have with your health care provider.     Standley Brooking. Janya Eveland M.D.

## 2014-11-23 ENCOUNTER — Telehealth: Payer: Self-pay | Admitting: Internal Medicine

## 2014-11-23 NOTE — Telephone Encounter (Signed)
Noted. Ok. 

## 2014-11-23 NOTE — Telephone Encounter (Signed)
Danielle Martin for [facility] is calling to let md know dr Marin Olp wanted  to see pt in 2-3 wk and pt is going out of town and opt for an appt on  12-18-14.

## 2014-12-15 ENCOUNTER — Telehealth: Payer: Self-pay | Admitting: Hematology & Oncology

## 2014-12-15 NOTE — Telephone Encounter (Signed)
I spoke w NEW PATIENT today to remind them of their appointment with Dr. Ennever. Also, advised them to bring all medication bottles and insurance card information. ° °

## 2014-12-18 ENCOUNTER — Ambulatory Visit: Payer: 59

## 2014-12-18 ENCOUNTER — Ambulatory Visit (HOSPITAL_BASED_OUTPATIENT_CLINIC_OR_DEPARTMENT_OTHER): Payer: 59 | Admitting: Family

## 2014-12-18 ENCOUNTER — Encounter: Payer: Self-pay | Admitting: Family

## 2014-12-18 ENCOUNTER — Other Ambulatory Visit (HOSPITAL_BASED_OUTPATIENT_CLINIC_OR_DEPARTMENT_OTHER): Payer: 59

## 2014-12-18 ENCOUNTER — Other Ambulatory Visit: Payer: Self-pay | Admitting: Family

## 2014-12-18 VITALS — BP 189/77 | HR 67 | Temp 97.9°F | Resp 14 | Ht 64.0 in | Wt 250.0 lb

## 2014-12-18 DIAGNOSIS — D75839 Thrombocytosis, unspecified: Secondary | ICD-10-CM

## 2014-12-18 DIAGNOSIS — D473 Essential (hemorrhagic) thrombocythemia: Secondary | ICD-10-CM

## 2014-12-18 LAB — CBC WITH DIFFERENTIAL (CANCER CENTER ONLY)
BASO#: 0 10*3/uL (ref 0.0–0.2)
BASO%: 0.5 % (ref 0.0–2.0)
EOS%: 6.7 % (ref 0.0–7.0)
Eosinophils Absolute: 0.6 10*3/uL — ABNORMAL HIGH (ref 0.0–0.5)
HCT: 38.7 % (ref 34.8–46.6)
HEMOGLOBIN: 12.5 g/dL (ref 11.6–15.9)
LYMPH#: 1.9 10*3/uL (ref 0.9–3.3)
LYMPH%: 22.8 % (ref 14.0–48.0)
MCH: 27.4 pg (ref 26.0–34.0)
MCHC: 32.3 g/dL (ref 32.0–36.0)
MCV: 85 fL (ref 81–101)
MONO#: 0.5 10*3/uL (ref 0.1–0.9)
MONO%: 6.3 % (ref 0.0–13.0)
NEUT#: 5.3 10*3/uL (ref 1.5–6.5)
NEUT%: 63.7 % (ref 39.6–80.0)
Platelets: 477 10*3/uL — ABNORMAL HIGH (ref 145–400)
RBC: 4.56 10*6/uL (ref 3.70–5.32)
RDW: 14.4 % (ref 11.1–15.7)
WBC: 8.4 10*3/uL (ref 3.9–10.0)

## 2014-12-18 LAB — CHCC SATELLITE - SMEAR

## 2014-12-18 NOTE — Progress Notes (Signed)
Hematology/Oncology Consultation   Name: LAMAR NAEF      MRN: [id number]    Location: Room/bed info not found  Date: 12/18/2014 Time:2:48 PM   REFERRING PHYSICIAN: Mariann Laster K. Panosh, MD  REASON FOR CONSULT: Thrombocytosis   DIAGNOSIS: Thrombocytosis   HISTORY OF PRESENT ILLNESS: Ms. Cavitt is a very pleasant 54 yo female with a history of elevated platelet counts. She is 477 today. She is asymptomatic and has no complaints at this time.  She denies fever, chills, rash, cough, headache, dizziness, blurred vision, chest pain, palpitations, abdominal pain, constipation, diarrhea, blood in urine or stool. No lymphadenopathy.  No bruising or episodes of bleeding. She states that her blood is "thick." No personal or familial history of blood clot.  She has two healthy daughters and had no miscarriages. She has "1/4 of an ovary." She had the other ovary and her uterus removed.  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 She states that she thinks she has a pinched nerve because when she sits on a hard surface her left hand goes numb. She has no swelling, tenderness or tingling in her extremities.  Her appetite is good and she makes sure to stay hydrated. She has had no significant weight change.  She states that her last mammogram was 4 years ago and it was normal. We will get her scheduled for another one today.  Her last colonoscopy was 4 years ago and normal.  She is originally from [place] and has lived in [place] for 24 years. She works for [employer].   ROS: All other 10 point review of systems is negative.   PAST MEDICAL HISTORY:   Past Medical History  Diagnosis Date  . Asthma     Worse in winter  . Allergic rhinitis     Worse in winter , grasses  . Headache(784.0)   . SVT (supraventricular tachycardia)     No longer a problem  . HEMORRHAGIC CYSTITIS 05/25/2009    Qualifier: Diagnosis  of  By: Regis Bill MD, Standley Brooking     ALLERGIES: Allergies  Allergen Reactions  . Codeine Anaphylaxis    Hallucinations      MEDICATIONS:  Current Outpatient Prescriptions on File Prior to Visit  Medication Sig Dispense Refill  . ADVAIR DISKUS 100-50 MCG/DOSE AEPB USE ONE INHALATION INTO THE LUNGS TWO TIMES A DAY OR AS DIRECTED 3 each 1  . albuterol (PROVENTIL HFA;VENTOLIN HFA) 108 (90 BASE) MCG/ACT inhaler Inhale 2 puffs into the lungs every 6 (six) hours as needed for wheezing.    Marland Kitchen albuterol (PROVENTIL) (2.5 MG/3ML) 0.083% nebulizer solution Take 3 mLs (2.5 mg total) by nebulization every 6 (six) hours as needed for wheezing or shortness of breath. 150 mL 1  . lisinopril (PRINIVIL,ZESTRIL) 10 MG tablet Take 1 tablet (10 mg total) by mouth daily. 90 tablet 0  . metoprolol succinate (TOPROL-XL) 50 MG 24 hr tablet Take 1 tablet (50 mg total) by mouth daily. Take with or immediately following a meal. 90 tablet 3  . Multiple Vitamin (MULTI-VITAMIN PO) Take by mouth.    . fluticasone (FLONASE) 50 MCG/ACT nasal spray Place 2 sprays into both nostrils daily. 2 each nostril 48 g 3   No current facility-administered medications on file prior to visit.     PAST SURGICAL HISTORY Past Surgical History  Procedure Laterality Date  . Cesarean section      x  2  . Oophorectomy      right  . Cervical spine surgery      FAMILY HISTORY: Family History  Problem Relation Age of Onset  . Prostate cancer Father   . Other Mother     Died of asphyxiation    SOCIAL HISTORY:  reports that she has never smoked. She has never used smokeless tobacco. She reports that she drinks alcohol. She reports that she does not use illicit drugs.  PERFORMANCE STATUS: The patient's performance status is 0 - Asymptomatic  PHYSICAL EXAM: Most Recent Vital Signs: Blood pressure 189/77, pulse 67, temperature 97.9 F (36.6 C), temperature source Oral, resp. rate 14, height 5\' 4"  (1.626 m), weight 250 lb (113.399  kg). BP 189/77 mmHg  Pulse 67  Temp(Src) 97.9 F (36.6 C) (Oral)  Resp 14  Ht 5\' 4"  (1.626 m)  Wt 250 lb (113.399 kg)  BMI 42.89 kg/m2  General Appearance:    Alert, cooperative, no distress, appears stated age  Head:    Normocephalic, without obvious abnormality, atraumatic  Eyes:    PERRL, conjunctiva/corneas clear, EOM's intact, fundi    benign, both eyes        Throat:   Lips, mucosa, and tongue normal; teeth and gums normal  Neck:   Supple, symmetrical, trachea midline, no adenopathy;    thyroid:  no enlargement/tenderness/nodules; no carotid   bruit or JVD  Back:     Symmetric, no curvature, ROM normal, no CVA tenderness  Lungs:     Clear to auscultation bilaterally, respirations unlabored  Chest Wall:    No tenderness or deformity   Heart:    Regular rate and rhythm, S1 and S2 normal, no murmur, rub   or gallop     Abdomen:     Soft, non-tender, bowel sounds active all four quadrants,    no masses, no organomegaly        Extremities:   Extremities normal, atraumatic, no cyanosis or edema  Pulses:   2+ and symmetric all extremities  Skin:   Skin color, texture, turgor normal, no rashes or lesions  Lymph nodes:   Cervical, supraclavicular, and axillary nodes normal  Neurologic:   CNII-XII intact, normal strength, sensation and reflexes    throughout   LABORATORY DATA:  Results for orders placed or performed in visit on 12/18/14 (from the past 48 hour(s))  CBC with Differential [facility])     Status: Abnormal   Collection Time: 12/18/14  2:10 PM  Result Value Ref Range   WBC 8.4 3.9 - 10.0 10e3/uL   RBC 4.56 3.70 - 5.32 10e6/uL   HGB 12.5 11.6 - 15.9 g/dL   HCT 38.7 34.8 - 46.6 %   MCV 85 81 - 101 fL   MCH 27.4 26.0 - 34.0 pg   MCHC 32.3 32.0 - 36.0 g/dL   RDW 14.4 11.1 - 15.7 %   Platelets 477 (H) 145 - 400 10e3/uL   NEUT# 5.3 1.5 - 6.5 10e3/uL   LYMPH# 1.9 0.9 - 3.3 10e3/uL   MONO# 0.5 0.1 - 0.9 10e3/uL   Eosinophils Absolute 0.6 (H) 0.0 - 0.5 10e3/uL    BASO# 0.0 0.0 - 0.2 10e3/uL   NEUT% 63.7 39.6 - 80.0 %   LYMPH% 22.8 14.0 - 48.0 %   MONO% 6.3 0.0 - 13.0 %   EOS% 6.7 0.0 - 7.0 %   BASO% 0.5 0.0 - 2.0 %  [facility] - Smear     Status: None  Collection Time: 12/18/14  2:10 PM  Result Value Ref Range   Smear Result Smear Available       RADIOGRAPHY: No results found.     PATHOLOGY: None  ASSESSMENT/PLAN: Ms. Piltz is a very pleasant 54 yo female with a history of elevated platelet counts. She is 477 today. She is asymptomatic and has no complaints at this time.  Dr. Marin Olp viewed her blood smear and did not identify any abnormality or evidence of malignancy.  We will see what her iron studies and JAK2 show.  This will determine whether or not we need to see her back in our office.  I ordered a mammogram on her to be done within 1 week.  All questions were answered. The patient knows to call the clinic with any problems, questions or concerns. We can certainly see the patient much sooner if necessary.  The patient was discussed with and also seen by Dr. Marin Olp and he is in agreement with the aforementioned.   [facility]    Addendum:    I saw and examined the patient with Sarah. We looked at her blood smear. Her blood smear looked fairly much unremarkable. Her platelets looked fairly uniform in size. I do not see a lot of large platelets. She had well granulated platelets. There were no nucleated red cells.  There is nothing on her physical exam that would she's just a myeloproliferative disorder.  We are doing the JAK2 assay.  I really don't think we have to see her back. Her thrombocytosis is mild at best. She is asymptomatic with this. I applaud her for taking baby aspirin. I think this was a good idea on her part.  If we do find that she does have an abnormality with lab work, then we can get her in sooner.  We spent about 45 minutes with her. We answered all of her questions.  Laurey Arrow

## 2014-12-19 LAB — IRON AND TIBC CHCC
%SAT: 11 % — AB (ref 21–57)
Iron: 39 ug/dL — ABNORMAL LOW (ref 41–142)
TIBC: 355 ug/dL (ref 236–444)
UIBC: 316 ug/dL (ref 120–384)

## 2014-12-19 LAB — FERRITIN CHCC: FERRITIN: 22 ng/mL (ref 9–269)

## 2014-12-20 ENCOUNTER — Telehealth: Payer: Self-pay | Admitting: Hematology & Oncology

## 2014-12-20 ENCOUNTER — Other Ambulatory Visit: Payer: Self-pay | Admitting: Family

## 2014-12-20 DIAGNOSIS — D509 Iron deficiency anemia, unspecified: Secondary | ICD-10-CM

## 2014-12-20 NOTE — Telephone Encounter (Signed)
Pt aware of 5-12 iron and 6-13 MD

## 2014-12-21 ENCOUNTER — Encounter: Payer: 59 | Attending: Internal Medicine | Admitting: *Deleted

## 2014-12-25 ENCOUNTER — Encounter: Payer: Self-pay | Admitting: Hematology & Oncology

## 2014-12-25 ENCOUNTER — Ambulatory Visit
Admission: RE | Admit: 2014-12-25 | Discharge: 2014-12-25 | Disposition: A | Discharge status: Home / Self Care | Payer: 59 | Source: Ambulatory Visit | Attending: Family | Admitting: Family

## 2014-12-25 DIAGNOSIS — D473 Essential (hemorrhagic) thrombocythemia: Secondary | ICD-10-CM

## 2014-12-25 DIAGNOSIS — D75839 Thrombocytosis, unspecified: Secondary | ICD-10-CM

## 2014-12-28 ENCOUNTER — Ambulatory Visit (HOSPITAL_BASED_OUTPATIENT_CLINIC_OR_DEPARTMENT_OTHER): Payer: 59

## 2014-12-28 VITALS — BP 156/73 | HR 63 | Temp 98.3°F | Resp 20

## 2014-12-28 DIAGNOSIS — D75839 Thrombocytosis, unspecified: Secondary | ICD-10-CM

## 2014-12-28 DIAGNOSIS — D473 Essential (hemorrhagic) thrombocythemia: Secondary | ICD-10-CM | POA: Diagnosis not present

## 2014-12-28 MED ORDER — SODIUM CHLORIDE 0.9 % IV SOLN
INTRAVENOUS | Status: DC
  Administered 2014-12-28: 13:00:00 via INTRAVENOUS

## 2014-12-28 MED ORDER — SODIUM CHLORIDE 0.9 % IV SOLN
510.0000 mg | Freq: Once | INTRAVENOUS | Status: AC
  Administered 2014-12-28: 510 mg via INTRAVENOUS
  Filled 2014-12-28: qty 17

## 2014-12-28 NOTE — Patient Instructions (Signed)

## 2015-01-16 ENCOUNTER — Ambulatory Visit (INDEPENDENT_AMBULATORY_CARE_PROVIDER_SITE_OTHER): Payer: 59 | Admitting: Internal Medicine

## 2015-01-16 ENCOUNTER — Encounter: Payer: Self-pay | Admitting: Internal Medicine

## 2015-01-16 VITALS — BP 170/100 | Temp 98.3°F | Ht 64.0 in | Wt 249.8 lb

## 2015-01-16 DIAGNOSIS — R058 Other specified cough: Secondary | ICD-10-CM

## 2015-01-16 DIAGNOSIS — I10 Essential (primary) hypertension: Secondary | ICD-10-CM

## 2015-01-16 DIAGNOSIS — R05 Cough: Secondary | ICD-10-CM

## 2015-01-16 DIAGNOSIS — T464X5A Adverse effect of angiotensin-converting-enzyme inhibitors, initial encounter: Secondary | ICD-10-CM

## 2015-01-16 DIAGNOSIS — L819 Disorder of pigmentation, unspecified: Secondary | ICD-10-CM

## 2015-01-16 DIAGNOSIS — Z79899 Other long term (current) drug therapy: Secondary | ICD-10-CM

## 2015-01-16 MED ORDER — LOSARTAN POTASSIUM-HCTZ 50-12.5 MG PO TABS: 1.0000 | ORAL_TABLET | Freq: Every day | ORAL | Status: DC

## 2015-01-16 NOTE — Patient Instructions (Signed)
Change her blood pressure medication to avoid cough and added diuretic.  follow-up visit in about 3 months. Skin lesion evaluation or removal uncertain if it could be a wart versus an abnormal mole.  Skin Surgery [facility]  [address], [place], [place] [address] [phone]

## 2015-01-16 NOTE — Progress Notes (Signed)
Pre visit review using our clinic review tool, if applicable. No additional management support is needed unless otherwise documented below in the visit note.  Chief Complaint  Patient presents with  . Follow-up    check skin area  . Hypertension    HPI: Danielle Martin 54 y.o.  comes in for chronic disease/ medication management  HT tracking  Am and night   Began with lsiinopril  averading  135 /96   Had cough had increased to 30 mg a day her blood pressure readings systolic are 694W to 546E and diastolics high 70J to 50K. With occasional elevation higher than 1. She states that her cough got better after running out of lisinopril about 3 weeks ago. Skin : Asked for [place] to check an area on her upper back skin lesion that's new over the last 3 months it is very very itchy and rough. Has some scratching of the area.  ROS: See pertinent positives and negatives per HPI. No chest pain shortness of breath syncope. Declares that her asthma is stable.  Past Medical History  Diagnosis Date  . Asthma     Worse in winter  . Allergic rhinitis     Worse in winter , grasses  . Headache(784.0)   . SVT (supraventricular tachycardia)     No longer a problem  . HEMORRHAGIC CYSTITIS 05/25/2009    Qualifier: Diagnosis of  By: Regis Bill MD, Standley Brooking     Family History  Problem Relation Age of Onset  . Prostate cancer Father   . Other Mother     Died of asphyxiation    History   Social History  . Marital Status: Married    Spouse Name: N/A  . Number of Children: N/A  . Years of Education: N/A   Social History Main Topics  . Smoking status: Never Smoker   . Smokeless tobacco: Never Used     Comment: NEVER USED TOBACCO  . Alcohol Use: 0.0 oz/week    2-3 Glasses of wine per week  . Drug Use: No  . Sexual Activity: Not on file   Other Topics Concern  . None   Social History Narrative   Married   Employed [employer]  40 hours a week   HH of 2    Husband has a Economist   No ets   Drinks [employer]   Soc etoh only rare no tob ets    Outpatient Prescriptions Prior to Visit  Medication Sig Dispense Refill  . ADVAIR DISKUS 100-50 MCG/DOSE AEPB USE ONE INHALATION INTO THE LUNGS TWO TIMES A DAY OR AS DIRECTED 3 each 1  . albuterol (PROVENTIL HFA;VENTOLIN HFA) 108 (90 BASE) MCG/ACT inhaler Inhale 2 puffs into the lungs every 6 (six) hours as needed for wheezing.    Marland Kitchen albuterol (PROVENTIL) (2.5 MG/3ML) 0.083% nebulizer solution Take 3 mLs (2.5 mg total) by nebulization every 6 (six) hours as needed for wheezing or shortness of breath. 150 mL 1  . Calcium Carbonate-Vitamin D 600-400 MG-UNIT per chew tablet Chew 1 tablet by mouth daily.    Marland Kitchen CINNAMON PO Take 1,000 Units by mouth every morning.    . metoprolol succinate (TOPROL-XL) 50 MG 24 hr tablet Take 1 tablet (50 mg total) by mouth daily. Take with or immediately following a meal. 90 tablet 3  . Multiple Vitamin (MULTI-VITAMIN PO) Take by mouth.    . vitamin E 400 UNIT capsule Take 400 Units by  mouth daily.    . fluticasone (FLONASE) 50 MCG/ACT nasal spray Place 2 sprays into both nostrils daily. 2 each nostril 48 g 3  . lisinopril (PRINIVIL,ZESTRIL) 10 MG tablet Take 1 tablet (10 mg total) by mouth daily. (Patient not taking: Reported on 01/16/2015) 90 tablet 0   No facility-administered medications prior to visit.     EXAM:  BP 170/100 mmHg  Temp(Src) 98.3 F (36.8 C) (Oral)  Ht 5\' 4"  (1.626 m)  Wt 249 lb 12.8 oz (113.309 kg)  BMI 42.86 kg/m2  Body mass index is 42.86 kg/(m^2).  GENERAL: vitals reviewed and listed above, alert, oriented, appears well hydrated and in no acute distress HEENT: atraumatic, conjunctiva  clear, no obvious abnormalities on inspection of external nose and ears OP : no lesion edema or exudate  LUNGS: clear to auscultation bilaterally, no wheezes, rales or rhonchi, good air movement CV: HRRR, no clubbing cyanosis or  peripheral edema nl cap refill  Skin upper back 3 mm   Raised flesh colored rough excoriated with some blood some pin point like  But base not warty  MS: moves all extremities without noticeable focal  abnormality PSYCH: pleasant and cooperative, no obvious depression or anxiety BP Readings from Last 3 Encounters:  01/16/15 170/100  12/28/14 156/73  12/18/14 189/77   Wt Readings from Last 3 Encounters:  01/16/15 249 lb 12.8 oz (113.309 kg)  12/18/14 250 lb (113.399 kg)  11/20/14 240 lb 14.4 oz (109.272 kg)   Lab Results  Component Value Date   WBC 8.4 12/18/2014   HGB 12.5 12/18/2014   HCT 38.7 12/18/2014   PLT 477* 12/18/2014   GLUCOSE 99 11/13/2014   CHOL 176 11/13/2014   TRIG 177.0* 11/13/2014   HDL 46.00 11/13/2014   LDLCALC 95 11/13/2014   ALT 22 11/13/2014   AST 19 11/13/2014   NA 138 11/13/2014   K 4.1 11/13/2014   CL 101 11/13/2014   CREATININE 0.74 11/13/2014   BUN 20 11/13/2014   CO2 29 11/13/2014   TSH 1.71 11/13/2014   INR 1.0 01/24/2009   blood pressure log reviewed   ASSESSMENT AND PLAN:  Discussed the following assessment and plan:  Essential hypertension - not controlled  elevated diastolic at home  change therapy  Pigmented skin lesion of uncertain nature - warty like but atypical  /  if mole or other removal here  ofr skin lesino eval ast [facility]  Cough due to ACE inhibitor - minimal but better off med  and not controlling  bp  at 30 mg dose cahgne to diuret combo   Medication management   Expectant management. With above plan change medication intensify add diuretic and change to ARB intensify if if needed. -Patient advised to return or notify health care team  if symptoms worsen ,persist or new concerns arise.  Patient Instructions  Change her blood pressure medication to avoid cough and added diuretic.  follow-up visit in about 3 months. Skin lesion evaluation or removal uncertain if it could be a wart versus an abnormal mole.  Skin Surgery [facility]  [address], [place],  [name] [address] [phone]     Standley Brooking. Panosh M.D.

## 2015-01-19 ENCOUNTER — Encounter: Payer: Self-pay | Admitting: Skilled Nursing Facility1

## 2015-01-19 ENCOUNTER — Encounter: Payer: 59 | Attending: Internal Medicine | Admitting: Skilled Nursing Facility1

## 2015-01-19 VITALS — Ht 64.0 in | Wt 249.0 lb

## 2015-01-19 DIAGNOSIS — Z6841 Body Mass Index (BMI) 40.0 and over, adult: Secondary | ICD-10-CM | POA: Insufficient documentation

## 2015-01-19 DIAGNOSIS — E669 Obesity, unspecified: Secondary | ICD-10-CM | POA: Diagnosis present

## 2015-01-19 DIAGNOSIS — Z713 Dietary counseling and surveillance: Secondary | ICD-10-CM | POA: Diagnosis not present

## 2015-01-19 NOTE — Progress Notes (Signed)
  Medical Nutrition Therapy:  Appt start time: 0800 end time:  0900.   Assessment:  Primary concerns today: referred for obesity. Pt states she did really well losing wt, was at 288 pounds and then hit a plateau and also states her blood pressure is out of control. Pt states she cut out carbs: bread, pasta, potatoes, all sodas and then the wt stopped being lost. Pt states she was never a steady wt, she has just been increasing in wt since the beginning of adulthood. Pt states she began her wt loss two years ago. Pt states her sleep is spiratic and wakes up frequently-feels her brain will not turn off. Pt states she eats in front of the television.   Preferred Learning Style:   No preference indicated   Learning Readiness:   Ready   MEDICATIONS: See List   DIETARY INTAKE:  Usual eating pattern includes 3 meals and 1 snacks per day.  Everyday foods include none stated.  Avoided foods include carbohydrate containing foods.    24-hr recall:  B ( AM): fried egg white and cheese sandwich on 40 calorie bread Snk ( AM): yogurt  L ( PM): leftovers-------fast food Snk ( PM): none D ( PM): chicken and vegetables Snk ( PM): none Beverages: water, unsweet tea  *Eats at least three meals a week outside of the home  Usual physical activity: ADL's  Estimated energy needs: 1600 calories 180 g carbohydrates 120 g protein 44 g fat  Progress Towards Goal(s):  In progress.   Nutritional Diagnosis:  NB-1.1 Food and nutrition-related knowledge deficit As related to avoiding carbohydrates.  As evidenced by pt report, 24 hour recall, and BMI 42.74.    Intervention:  Nutrition counseling. Dietitian educated the pt on carbohydrates, eating three meals a day and 2-3 snacks, and the importance of physical activity. Dietitian also discussed the sodium levels in fast food and its relation to hypertension.  Goals: -Try to be physically active 4 days a week and include some weight baring activities    Teaching Method Utilized:  Visual Auditory  Handouts given during visit include:  MyPlate  15 gram carbohydrate snack sheet  Barriers to learning/adherence to lifestyle change: weight loss takes time and effort  Demonstrated degree of understanding via:  Teach Back   Monitoring/Evaluation:  Dietary intake, exercise, and body weight in 3 month(s).

## 2015-01-29 ENCOUNTER — Ambulatory Visit (HOSPITAL_BASED_OUTPATIENT_CLINIC_OR_DEPARTMENT_OTHER): Payer: 59 | Admitting: Hematology & Oncology

## 2015-01-29 ENCOUNTER — Encounter: Payer: Self-pay | Admitting: Hematology & Oncology

## 2015-01-29 ENCOUNTER — Other Ambulatory Visit (HOSPITAL_BASED_OUTPATIENT_CLINIC_OR_DEPARTMENT_OTHER): Payer: 59

## 2015-01-29 VITALS — BP 157/86 | HR 76 | Temp 98.4°F | Resp 18 | Wt 250.0 lb

## 2015-01-29 DIAGNOSIS — D473 Essential (hemorrhagic) thrombocythemia: Secondary | ICD-10-CM

## 2015-01-29 DIAGNOSIS — E611 Iron deficiency: Secondary | ICD-10-CM | POA: Diagnosis not present

## 2015-01-29 DIAGNOSIS — D75839 Thrombocytosis, unspecified: Secondary | ICD-10-CM

## 2015-01-29 DIAGNOSIS — D509 Iron deficiency anemia, unspecified: Secondary | ICD-10-CM

## 2015-01-29 LAB — CBC WITH DIFFERENTIAL (CANCER CENTER ONLY)
BASO#: 0 10*3/uL (ref 0.0–0.2)
BASO%: 0.5 % (ref 0.0–2.0)
EOS%: 7.6 % — ABNORMAL HIGH (ref 0.0–7.0)
Eosinophils Absolute: 0.6 10*3/uL — ABNORMAL HIGH (ref 0.0–0.5)
HCT: 42.5 % (ref 34.8–46.6)
HGB: 13.9 g/dL (ref 11.6–15.9)
LYMPH#: 2.3 10*3/uL (ref 0.9–3.3)
LYMPH%: 29.5 % (ref 14.0–48.0)
MCH: 28.4 pg (ref 26.0–34.0)
MCHC: 32.7 g/dL (ref 32.0–36.0)
MCV: 87 fL (ref 81–101)
MONO#: 0.6 10*3/uL (ref 0.1–0.9)
MONO%: 7.3 % (ref 0.0–13.0)
NEUT%: 55.1 % (ref 39.6–80.0)
NEUTROS ABS: 4.4 10*3/uL (ref 1.5–6.5)
Platelets: 461 10*3/uL — ABNORMAL HIGH (ref 145–400)
RBC: 4.9 10*6/uL (ref 3.70–5.32)
RDW: 14.5 % (ref 11.1–15.7)
WBC: 7.9 10*3/uL (ref 3.9–10.0)

## 2015-01-29 NOTE — Progress Notes (Signed)
Hematology and Oncology Follow Up Visit  Danielle Martin [id number] 01/18/1961 54 y.o. 01/29/2015   Principle Diagnosis:   Thrombocytosis  Iron deficiency anemia  Current Therapy:    IV iron as indicated  Aspirin 81 mg by mouth every other day.     Interim History:  Danielle Martin is is back for follow-up. This is an office visit. We first saw her back in early May.  Point time, we found that she was little iron deficient. She had a ferritin of 22 and iron saturation of 11%. Per she received IV iron. She tolerated this quite well.  We did check a JAK2 assay and this was negative.  She feels okay. She is working without any difficulties. Per she is on a baby aspirin every other day.  She's had no problems bowels or bladder. She's had no nausea vomiting. She's had no cough. His been no rashes. She's had no pain in her hands or feet.  She's had no headache.   Overall, her performance status is ECOG is 0.   Medications:  Current outpatient prescriptions:  .  ADVAIR DISKUS 100-50 MCG/DOSE AEPB, USE ONE INHALATION INTO THE LUNGS TWO TIMES A DAY OR AS DIRECTED, Disp: 3 each, Rfl: 1 .  albuterol (PROVENTIL HFA;VENTOLIN HFA) 108 (90 BASE) MCG/ACT inhaler, Inhale 2 puffs into the lungs every 6 (six) hours as needed for wheezing., Disp: , Rfl:  .  albuterol (PROVENTIL) (2.5 MG/3ML) 0.083% nebulizer solution, Take 3 mLs (2.5 mg total) by nebulization every 6 (six) hours as needed for wheezing or shortness of breath., Disp: 150 mL, Rfl: 1 .  Calcium Carbonate-Vitamin D 600-400 MG-UNIT per chew tablet, Chew 1 tablet by mouth daily., Disp: , Rfl:  .  CINNAMON PO, Take 1,000 Units by mouth every morning., Disp: , Rfl:  .  fluticasone (FLONASE) 50 MCG/ACT nasal spray, Place 2 sprays into both nostrils daily. 2 each nostril, Disp: 48 g, Rfl: 3 .  losartan-hydrochlorothiazide (HYZAAR) 50-12.5 MG per tablet, Take 1 tablet by mouth daily., Disp: 90 tablet, Rfl: 1 .  metoprolol succinate (TOPROL-XL) 50 MG  24 hr tablet, Take 1 tablet (50 mg total) by mouth daily. Take with or immediately following a meal., Disp: 90 tablet, Rfl: 3 .  Multiple Vitamin (MULTI-VITAMIN PO), Take by mouth., Disp: , Rfl:  .  vitamin E 400 UNIT capsule, Take 400 Units by mouth daily., Disp: , Rfl:   Allergies:  Allergies  Allergen Reactions  . Codeine Anaphylaxis    Hallucinations  . Ace Inhibitors Cough    Past Medical History, Surgical history, Social history, and Family History were reviewed and updated.  Review of Systems: As above  Physical Exam:  weight is 250 lb (113.399 kg). Her oral temperature is 98.4 F (36.9 C). Her blood pressure is 157/86 and her pulse is 76. Her respiration is 18.   Wt Readings from Last 3 Encounters:  01/29/15 250 lb (113.399 kg)  01/19/15 249 lb (112.946 kg)  01/16/15 249 lb 12.8 oz (113.309 kg)     Obese white female in no obvious distress. Head and neck exam shows no ocular or oral lesions. There are no palpable cervical or supraclavicular lymph nodes. Lungs are clear. Cardiac exam regular rate and rhythm with no murmurs, rubs or bruits. Abdomen is soft. She is good bowel sounds. She is somewhat obese. She has no fluid wave. There is no palpable liver or spleen tip. Back exam shows no tenderness over the spine, ribs or hips. Extremities  shows no clubbing, cyanosis or edema. Skin exam shows no rashes, ecchymoses or petechia.  Lab Results  Component Value Date   WBC 7.9 01/29/2015   HGB 13.9 01/29/2015   HCT 42.5 01/29/2015   MCV 87 01/29/2015   PLT 461* 01/29/2015     Chemistry      Component Value Date/Time   NA 138 11/13/2014 0836   K 4.1 11/13/2014 0836   CL 101 11/13/2014 0836   CO2 29 11/13/2014 0836   BUN 20 11/13/2014 0836   CREATININE 0.74 11/13/2014 0836      Component Value Date/Time   CALCIUM 9.1 11/13/2014 0836   CALCIUM 8.7 02/24/2007 1630   ALKPHOS 50 11/13/2014 0836   AST 19 11/13/2014 0836   ALT 22 11/13/2014 0836   BILITOT 0.6 11/13/2014  0836         Impression and Plan: Danielle Martin is 54 year old white female. She has thrombocytosis. I think this is reactive. I think this is probably from iron deficiency. Her blood count is coming down slowly. She is asymptomatic.  She had a colonoscopy back in 2012 and without okay on that.  From my point of view, I think we can get her back in about for 5 months. I think this would be reasonable.  I think that if her platelet count continues to improve or at least stabilizes, then we can probably let her go from the clinic.    Volanda Napoleon, MD 6/13/20162:41 PM

## 2015-01-30 LAB — IRON AND TIBC CHCC
%SAT: 26 % (ref 21–57)
IRON: 79 ug/dL (ref 41–142)
TIBC: 300 ug/dL (ref 236–444)
UIBC: 221 ug/dL (ref 120–384)

## 2015-01-30 LAB — FERRITIN CHCC: FERRITIN: 135 ng/mL (ref 9–269)

## 2015-03-10 ENCOUNTER — Other Ambulatory Visit: Payer: Self-pay | Admitting: Internal Medicine

## 2015-03-12 NOTE — Telephone Encounter (Signed)
Denied.  Pt is no longer taking this medication. 

## 2015-04-27 ENCOUNTER — Ambulatory Visit: Payer: 59 | Admitting: Skilled Nursing Facility1

## 2015-05-01 ENCOUNTER — Ambulatory Visit (HOSPITAL_BASED_OUTPATIENT_CLINIC_OR_DEPARTMENT_OTHER): Payer: 59 | Admitting: Hematology & Oncology

## 2015-05-01 ENCOUNTER — Encounter: Payer: Self-pay | Admitting: Hematology & Oncology

## 2015-05-01 ENCOUNTER — Other Ambulatory Visit (HOSPITAL_BASED_OUTPATIENT_CLINIC_OR_DEPARTMENT_OTHER): Payer: 59

## 2015-05-01 VITALS — BP 142/83 | HR 78 | Temp 98.0°F | Resp 16 | Ht 64.0 in | Wt 255.0 lb

## 2015-05-01 DIAGNOSIS — D500 Iron deficiency anemia secondary to blood loss (chronic): Secondary | ICD-10-CM

## 2015-05-01 DIAGNOSIS — D509 Iron deficiency anemia, unspecified: Secondary | ICD-10-CM | POA: Diagnosis not present

## 2015-05-01 DIAGNOSIS — D473 Essential (hemorrhagic) thrombocythemia: Secondary | ICD-10-CM

## 2015-05-01 DIAGNOSIS — D75839 Thrombocytosis, unspecified: Secondary | ICD-10-CM

## 2015-05-01 HISTORY — DX: Iron deficiency anemia, unspecified: D50.9

## 2015-05-01 LAB — CBC WITH DIFFERENTIAL (CANCER CENTER ONLY)
BASO#: 0.1 10*3/uL (ref 0.0–0.2)
BASO%: 0.6 % (ref 0.0–2.0)
EOS ABS: 0.5 10*3/uL (ref 0.0–0.5)
EOS%: 6.3 % (ref 0.0–7.0)
HEMATOCRIT: 38.5 % (ref 34.8–46.6)
HGB: 12.7 g/dL (ref 11.6–15.9)
LYMPH#: 1.9 10*3/uL (ref 0.9–3.3)
LYMPH%: 24.6 % (ref 14.0–48.0)
MCH: 29.4 pg (ref 26.0–34.0)
MCHC: 33 g/dL (ref 32.0–36.0)
MCV: 89 fL (ref 81–101)
MONO#: 0.5 10*3/uL (ref 0.1–0.9)
MONO%: 5.8 % (ref 0.0–13.0)
NEUT#: 4.9 10*3/uL (ref 1.5–6.5)
NEUT%: 62.7 % (ref 39.6–80.0)
PLATELETS: 421 10*3/uL — AB (ref 145–400)
RBC: 4.32 10*6/uL (ref 3.70–5.32)
RDW: 13.6 % (ref 11.1–15.7)
WBC: 7.8 10*3/uL (ref 3.9–10.0)

## 2015-05-01 LAB — CHCC SATELLITE - SMEAR

## 2015-05-01 LAB — FERRITIN CHCC: FERRITIN: 58 ng/mL (ref 9–269)

## 2015-05-01 LAB — IRON AND TIBC CHCC
%SAT: 26 % (ref 21–57)
Iron: 76 ug/dL (ref 41–142)
TIBC: 291 ug/dL (ref 236–444)
UIBC: 215 ug/dL (ref 120–384)

## 2015-05-01 NOTE — Progress Notes (Signed)
Hematology and Oncology Follow Up Visit  Danielle Martin [id number] 12/02/1960 54 y.o. 05/01/2015   Principle Diagnosis:   Thrombocytosis  Iron deficiency anemia  Current Therapy:    IV iron as indicated  Aspirin 81 mg by mouth every other day.     Interim History:  Danielle Martin is is back for follow-up. She is doing well. She had a good summer. She's getting ready for a cruise in the wintertime. She'll be gone in December.  She had iron in the past. This is helped with her thrombocytosis. Her last iron studies back in June showed a ferritin of 135 with an iron saturation of 26%.  She's not noted any bleeding. There's been no change in bowel or bladder habits.  Her last mammogram was done in May. Everything looked fine.  She's had no cough or shortness of breath. She's had no rashes. She's had no nausea or vomiting. There's been no swollen nodes. .   Overall, her performance status is ECOG is 0.   Medications:  Current outpatient prescriptions:  .  ADVAIR DISKUS 100-50 MCG/DOSE AEPB, USE ONE INHALATION INTO THE LUNGS TWO TIMES A DAY OR AS DIRECTED, Disp: 3 each, Rfl: 1 .  albuterol (PROVENTIL HFA;VENTOLIN HFA) 108 (90 BASE) MCG/ACT inhaler, Inhale 2 puffs into the lungs every 6 (six) hours as needed for wheezing., Disp: , Rfl:  .  albuterol (PROVENTIL) (2.5 MG/3ML) 0.083% nebulizer solution, Take 3 mLs (2.5 mg total) by nebulization every 6 (six) hours as needed for wheezing or shortness of breath., Disp: 150 mL, Rfl: 1 .  Calcium Carbonate-Vitamin D 600-400 MG-UNIT per chew tablet, Chew 1 tablet by mouth daily., Disp: , Rfl:  .  CINNAMON PO, Take 1,000 Units by mouth every morning., Disp: , Rfl:  .  losartan-hydrochlorothiazide (HYZAAR) 50-12.5 MG per tablet, Take 1 tablet by mouth daily., Disp: 90 tablet, Rfl: 1 .  metoprolol succinate (TOPROL-XL) 50 MG 24 hr tablet, Take 1 tablet (50 mg total) by mouth daily. Take with or immediately following a meal., Disp: 90 tablet, Rfl: 3 .   Multiple Vitamin (MULTI-VITAMIN PO), Take by mouth., Disp: , Rfl:  .  vitamin E 400 UNIT capsule, Take 400 Units by mouth daily., Disp: , Rfl:  .  fluticasone (FLONASE) 50 MCG/ACT nasal spray, Place 2 sprays into both nostrils daily. 2 each nostril, Disp: 48 g, Rfl: 3  Allergies:  Allergies  Allergen Reactions  . Codeine Anaphylaxis    Hallucinations  . Ace Inhibitors Cough    Past Medical History, Surgical history, Social history, and Family History were reviewed and updated.  Review of Systems: As above  Physical Exam:  height is 5\' 4"  (1.626 m) and weight is 255 lb (115.667 kg). Her temperature is 98 F (36.7 C). Her blood pressure is 142/83 and her pulse is 78. Her respiration is 16.   Wt Readings from Last 3 Encounters:  05/01/15 255 lb (115.667 kg)  01/29/15 250 lb (113.399 kg)  01/19/15 249 lb (112.946 kg)     Obese white female in no obvious distress. Head and neck exam shows no ocular or oral lesions. There are no palpable cervical or supraclavicular lymph nodes. Lungs are clear. Cardiac exam regular rate and rhythm with no murmurs, rubs or bruits. Abdomen is soft. She is good bowel sounds. She is somewhat obese. She has no fluid wave. There is no palpable liver or spleen tip. Back exam shows no tenderness over the spine, ribs or hips. Extremities shows no  clubbing, cyanosis or edema. Skin exam shows no rashes, ecchymoses or petechia.  Lab Results  Component Value Date   WBC 7.8 05/01/2015   HGB 12.7 05/01/2015   HCT 38.5 05/01/2015   MCV 89 05/01/2015   PLT 421* 05/01/2015     Chemistry      Component Value Date/Time   NA 138 11/13/2014 0836   K 4.1 11/13/2014 0836   CL 101 11/13/2014 0836   CO2 29 11/13/2014 0836   BUN 20 11/13/2014 0836   CREATININE 0.74 11/13/2014 0836      Component Value Date/Time   CALCIUM 9.1 11/13/2014 0836   CALCIUM 8.7 02/24/2007 1630   ALKPHOS 50 11/13/2014 0836   AST 19 11/13/2014 0836   ALT 22 11/13/2014 0836   BILITOT 0.6  11/13/2014 0836         Impression and Plan: Danielle Martin is 54 year old white female. She has thrombocytosis. I think this is reactive. I think this is probably from iron deficiency. Her platelet count is looking better. I think the iron helped her.  I think we can get her back now in 4 months. If all looks good in 4 months, then we might be able to let her go for the clinic.   Volanda Napoleon, MD 9/13/201610:33 AM

## 2015-05-02 ENCOUNTER — Telehealth: Payer: Self-pay | Admitting: *Deleted

## 2015-05-02 NOTE — Telephone Encounter (Signed)
-----   Message from Volanda Napoleon, MD sent at 05/02/2015  1:22 PM EDT ----- Please call and tell her that the iron level looks okay.

## 2015-06-01 ENCOUNTER — Other Ambulatory Visit: Payer: Self-pay | Admitting: Family Medicine

## 2015-06-01 ENCOUNTER — Telehealth: Payer: Self-pay | Admitting: Internal Medicine

## 2015-06-01 DIAGNOSIS — I10 Essential (primary) hypertension: Secondary | ICD-10-CM

## 2015-06-01 NOTE — Telephone Encounter (Signed)
Pt was to follow up in 2-3 months for bp.  Pt states she was also to have labs done prior to the appomntment,  but I do not see any orders of in AVS. Do you need labs done on pt prior to appt? Pt had a med change in April.

## 2015-06-01 NOTE — Telephone Encounter (Signed)
I have placed the order.  Please help the pt to make appointments.  Thanks!

## 2015-06-04 NOTE — Telephone Encounter (Signed)
Pt has been scheduled.  °

## 2015-06-08 ENCOUNTER — Ambulatory Visit (INDEPENDENT_AMBULATORY_CARE_PROVIDER_SITE_OTHER): Payer: 59 | Admitting: Internal Medicine

## 2015-06-08 ENCOUNTER — Encounter: Payer: Self-pay | Admitting: Internal Medicine

## 2015-06-08 VITALS — BP 132/82 | Temp 98.8°F | Wt 256.0 lb

## 2015-06-08 DIAGNOSIS — I10 Essential (primary) hypertension: Secondary | ICD-10-CM

## 2015-06-08 DIAGNOSIS — Z23 Encounter for immunization: Secondary | ICD-10-CM

## 2015-06-08 LAB — BASIC METABOLIC PANEL
BUN: 19 mg/dL (ref 6–23)
CALCIUM: 9.5 mg/dL (ref 8.4–10.5)
CHLORIDE: 100 meq/L (ref 96–112)
CO2: 36 meq/L — AB (ref 19–32)
CREATININE: 0.61 mg/dL (ref 0.40–1.20)
GFR: 109.35 mL/min (ref >60.00)
GLUCOSE: 102 mg/dL — AB (ref 70–99)
Potassium: 4.1 mEq/L (ref 3.5–5.1)
Sodium: 143 mEq/L (ref 135–145)

## 2015-06-08 MED ORDER — LOSARTAN POTASSIUM-HCTZ 50-12.5 MG PO TABS: 1.0000 | ORAL_TABLET | Freq: Every day | ORAL | Status: DC

## 2015-06-08 NOTE — Patient Instructions (Addendum)
Continue same medication Monitor as needed.  Will notify you  of labs when available.  Recheck at your yearly  welleness in about 6 months

## 2015-06-08 NOTE — Progress Notes (Signed)
Pre visit review using our clinic review tool, if applicable. No additional management support is needed unless otherwise documented below in the visit note.  Chief Complaint  Patient presents with  . Follow-up  . Hypertension    HPI: Danielle Martin 54 y.o. comesi n for fu HT bp elevation chg from ace and cough all gone   Ocass dizziness .positionall but not with exercise  No other poss se   bp readings  Am and pm and  systat goal  /  Diast.    86 range .  Exercise:Walking and  bike .  ROS: See pertinent positives and negatives per HPI. No cough cp sob syncope  Past Medical History  Diagnosis Date  . Asthma     Worse in winter  . Allergic rhinitis     Worse in winter , grasses  . Headache(784.0)   . SVT (supraventricular tachycardia) (HCC)     No longer a problem  . HEMORRHAGIC CYSTITIS 05/25/2009    Qualifier: Diagnosis of  By: Regis Bill MD, Standley Brooking   . Iron deficiency anemia 05/01/2015    Family History  Problem Relation Age of Onset  . Prostate cancer Father   . Other Mother     Died of asphyxiation    Social History   Social History  . Marital Status: Married    Spouse Name: N/A  . Number of Children: N/A  . Years of Education: N/A   Social History Main Topics  . Smoking status: Never Smoker   . Smokeless tobacco: Never Used     Comment: NEVER USED TOBACCO  . Alcohol Use: 0.0 oz/week    2-3 Glasses of wine per week  . Drug Use: No  . Sexual Activity: Not Asked   Other Topics Concern  . None   Social History Narrative   Married   Employed [employer]  40 hours a week   HH of 2    Husband has a Economist   No ets   Drinks [employer]   Soc etoh only rare no tob ets    Outpatient Prescriptions Prior to Visit  Medication Sig Dispense Refill  . ADVAIR DISKUS 100-50 MCG/DOSE AEPB USE ONE INHALATION INTO THE LUNGS TWO TIMES A DAY OR AS DIRECTED 3 each 1  . albuterol (PROVENTIL HFA;VENTOLIN HFA) 108 (90 BASE) MCG/ACT inhaler  Inhale 2 puffs into the lungs every 6 (six) hours as needed for wheezing.    Marland Kitchen albuterol (PROVENTIL) (2.5 MG/3ML) 0.083% nebulizer solution Take 3 mLs (2.5 mg total) by nebulization every 6 (six) hours as needed for wheezing or shortness of breath. 150 mL 1  . Calcium Carbonate-Vitamin D 600-400 MG-UNIT per chew tablet Chew 1 tablet by mouth daily.    Marland Kitchen CINNAMON PO Take 1,000 Units by mouth every morning.    . metoprolol succinate (TOPROL-XL) 50 MG 24 hr tablet Take 1 tablet (50 mg total) by mouth daily. Take with or immediately following a meal. 90 tablet 3  . Multiple Vitamin (MULTI-VITAMIN PO) Take by mouth.    . vitamin E 400 UNIT capsule Take 400 Units by mouth daily.    Marland Kitchen losartan-hydrochlorothiazide (HYZAAR) 50-12.5 MG per tablet Take 1 tablet by mouth daily. 90 tablet 1  . fluticasone (FLONASE) 50 MCG/ACT nasal spray Place 2 sprays into both nostrils daily. 2 each nostril 48 g 3   No facility-administered medications prior to visit.     EXAM:  BP 132/82  mmHg  Temp(Src) 98.8 F (37.1 C) (Oral)  Wt 256 lb (116.121 kg)  Body mass index is 43.92 kg/(m^2). bp recheck GENERAL: vitals reviewed and listed above, alert, oriented, appears well hydrated and in no acute distress CV: HRRR, no clubbing cyanosis or  peripheral edema nl cap refill  MS: moves all extremities without noticeable focal  abnormality PSYCH: pleasant and cooperative, no obvious depression or anxiety BP Readings from Last 3 Encounters:  06/08/15 132/82  05/01/15 142/83  01/29/15 157/86   Lab Results  Component Value Date   WBC 7.8 05/01/2015   HGB 12.7 05/01/2015   HCT 38.5 05/01/2015   PLT 421* 05/01/2015   GLUCOSE 99 11/13/2014   CHOL 176 11/13/2014   TRIG 177.0* 11/13/2014   HDL 46.00 11/13/2014   LDLCALC 95 11/13/2014   ALT 22 11/13/2014   AST 19 11/13/2014   NA 138 11/13/2014   K 4.1 11/13/2014   CL 101 11/13/2014   CREATININE 0.74 11/13/2014   BUN 20 11/13/2014   CO2 29 11/13/2014   TSH 1.71  11/13/2014   INR 1.0 01/24/2009   Wt Readings from Last 3 Encounters:  06/08/15 256 lb (116.121 kg)  05/01/15 255 lb (115.667 kg)  01/29/15 250 lb (113.399 kg)    ASSESSMENT AND PLAN:  Discussed the following assessment and plan:  Essential hypertension - controlled continue check bmp i fok check yearly PV or as needed - Plan: Basic metabolic panel  Need for prophylactic vaccination and inoculation against influenza - Plan: Flu Vaccine QUAD 36+ mos PF IM (Fluarix & Fluzone Quad PF)  -Patient advised to return or notify health care team  if symptoms worsen ,persist or new concerns arise.  Patient Instructions  Continue same medication Monitor as needed.  Will notify you  of labs when available.  Recheck at your yearly  welleness in about 6 months    Standley Brooking. Panosh M.D.

## 2015-08-31 ENCOUNTER — Other Ambulatory Visit (HOSPITAL_BASED_OUTPATIENT_CLINIC_OR_DEPARTMENT_OTHER): Payer: 59

## 2015-08-31 ENCOUNTER — Ambulatory Visit (HOSPITAL_BASED_OUTPATIENT_CLINIC_OR_DEPARTMENT_OTHER): Payer: 59 | Admitting: Family

## 2015-08-31 ENCOUNTER — Encounter: Payer: Self-pay | Admitting: Family

## 2015-08-31 VITALS — BP 158/85 | HR 65 | Temp 98.2°F | Resp 16 | Ht 64.0 in | Wt 258.0 lb

## 2015-08-31 DIAGNOSIS — D509 Iron deficiency anemia, unspecified: Secondary | ICD-10-CM

## 2015-08-31 DIAGNOSIS — D473 Essential (hemorrhagic) thrombocythemia: Secondary | ICD-10-CM | POA: Diagnosis not present

## 2015-08-31 DIAGNOSIS — D500 Iron deficiency anemia secondary to blood loss (chronic): Secondary | ICD-10-CM

## 2015-08-31 DIAGNOSIS — I10 Essential (primary) hypertension: Secondary | ICD-10-CM

## 2015-08-31 DIAGNOSIS — D75839 Thrombocytosis, unspecified: Secondary | ICD-10-CM

## 2015-08-31 LAB — CBC WITH DIFFERENTIAL (CANCER CENTER ONLY)
BASO#: 0 10*3/uL (ref 0.0–0.2)
BASO%: 0.6 % (ref 0.0–2.0)
EOS ABS: 0.5 10*3/uL (ref 0.0–0.5)
EOS%: 6.7 % (ref 0.0–7.0)
HEMATOCRIT: 42.6 % (ref 34.8–46.6)
HEMOGLOBIN: 13.4 g/dL (ref 11.6–15.9)
LYMPH#: 1.9 10*3/uL (ref 0.9–3.3)
LYMPH%: 27.1 % (ref 14.0–48.0)
MCH: 27.5 pg (ref 26.0–34.0)
MCHC: 31.5 g/dL — AB (ref 32.0–36.0)
MCV: 88 fL (ref 81–101)
MONO#: 0.5 10*3/uL (ref 0.1–0.9)
MONO%: 7.2 % (ref 0.0–13.0)
NEUT%: 58.4 % (ref 39.6–80.0)
NEUTROS ABS: 4.1 10*3/uL (ref 1.5–6.5)
Platelets: 435 10*3/uL — ABNORMAL HIGH (ref 145–400)
RBC: 4.87 10*6/uL (ref 3.70–5.32)
RDW: 13.5 % (ref 11.1–15.7)
WBC: 7 10*3/uL (ref 3.9–10.0)

## 2015-08-31 LAB — IRON AND TIBC
%SAT: 19 % — ABNORMAL LOW (ref 21–57)
Iron: 64 ug/dL (ref 41–142)
TIBC: 333 ug/dL (ref 236–444)
UIBC: 269 ug/dL (ref 120–384)

## 2015-08-31 LAB — FERRITIN: FERRITIN: 22 ng/mL (ref 9–269)

## 2015-08-31 LAB — CHCC SATELLITE - SMEAR

## 2015-08-31 NOTE — Progress Notes (Signed)
Hematology and Oncology Follow Up Visit  RENALDA KAMINER [id number] 1960-10-26 55 y.o. 08/31/2015   Principle Diagnosis:  Thrombocytosis Iron deficiency anemia  Current Therapy:   IV iron as indicated Aspirin 81 mg by mouth every other day    Interim History:  Ms. Gless is here today for follow-up. She is doing well and has no complaints at this time.  She received Feraheme in May of last year and responded nicely. Her iron saturation in September was 26% with a ferritin of 58.  No episodes of bleeding, bruising or petechiae. No lymphadenopathy found on exam.  She denies fatigue. No fever, chills, n/v, cough, rash, dizziness, SOB, chest pain, palpitations, abdominal pain or changes in bowel or bladder habits.  No swelling, tenderness, numbness or tingling in her extremities. She has no c/o joint pain.  She has a healthy appetite and is staying well hydrated. Her weight is stable.   Medications:    Medication List       This list is accurate as of: 08/31/15 10:15 AM.  Always use your most recent med list.               ADVAIR DISKUS 100-50 MCG/DOSE Aepb  Generic drug:  Fluticasone-Salmeterol  USE ONE INHALATION INTO THE LUNGS TWO TIMES A DAY OR AS DIRECTED     albuterol 108 (90 Base) MCG/ACT inhaler  Commonly known as:  PROVENTIL HFA;VENTOLIN HFA  Inhale 2 puffs into the lungs every 6 (six) hours as needed for wheezing.     albuterol (2.5 MG/3ML) 0.083% nebulizer solution  Commonly known as:  PROVENTIL  Take 3 mLs (2.5 mg total) by nebulization every 6 (six) hours as needed for wheezing or shortness of breath.     Calcium Carbonate-Vitamin D 600-400 MG-UNIT chew tablet  Chew 1 tablet by mouth daily.     CINNAMON PO  Take 1,000 Units by mouth every morning.     fluticasone 50 MCG/ACT nasal spray  Commonly known as:  FLONASE  Place 2 sprays into both nostrils daily. 2 each nostril     losartan-hydrochlorothiazide 50-12.5 MG tablet  Commonly known as:  HYZAAR  Take 1  tablet by mouth daily.     metoprolol succinate 50 MG 24 hr tablet  Commonly known as:  TOPROL-XL  Take 1 tablet (50 mg total) by mouth daily. Take with or immediately following a meal.     MULTI-VITAMIN PO  Take by mouth.     vitamin E 400 UNIT capsule  Take 400 Units by mouth daily.        Allergies:  Allergies  Allergen Reactions  . Codeine Anaphylaxis    Hallucinations  . Ace Inhibitors Cough    Past Medical History, Surgical history, Social history, and Family History were reviewed and updated.  Review of Systems: All other 10 point review of systems is negative.   Physical Exam:  height is 5\' 4"  (1.626 m) and weight is 258 lb (117.028 kg). Her oral temperature is 98.2 F (36.8 C). Her blood pressure is 158/85 and her pulse is 65. Her respiration is 16.   Wt Readings from Last 3 Encounters:  08/31/15 258 lb (117.028 kg)  06/08/15 256 lb (116.121 kg)  05/01/15 255 lb (115.667 kg)    Ocular: Sclerae unicteric, pupils equal, round and reactive to light Ear-nose-throat: Oropharynx clear, dentition fair Lymphatic: No cervical supraclavicular or axillary adenopathy Lungs no rales or rhonchi, good excursion bilaterally Heart regular rate and rhythm, no murmur appreciated Abd  soft, nontender, positive bowel sounds, no liver or spleen tip palpated on exam MSK no focal spinal tenderness, no joint edema Neuro: non-focal, well-oriented, appropriate affect Breasts: Deferred  Lab Results  Component Value Date   WBC 7.0 08/31/2015   HGB 13.4 08/31/2015   HCT 42.6 08/31/2015   MCV 88 08/31/2015   PLT 435* 08/31/2015   Lab Results  Component Value Date   FERRITIN 58 05/01/2015   IRON 76 05/01/2015   TIBC 291 05/01/2015   UIBC 215 05/01/2015   IRONPCTSAT 26 05/01/2015   Lab Results  Component Value Date   RBC 4.87 08/31/2015   No results found for: KPAFRELGTCHN, LAMBDASER, KAPLAMBRATIO No results found for: IGGSERUM, IGA, IGMSERUM No results found for:  Odetta Pink, SPEI   Chemistry      Component Value Date/Time   NA 143 06/08/2015 0927   K 4.1 06/08/2015 0927   CL 100 06/08/2015 0927   CO2 36* 06/08/2015 0927   BUN 19 06/08/2015 0927   CREATININE 0.61 06/08/2015 0927      Component Value Date/Time   CALCIUM 9.5 06/08/2015 0927   CALCIUM 8.7 02/24/2007 1630   ALKPHOS 50 11/13/2014 0836   AST 19 11/13/2014 0836   ALT 22 11/13/2014 0836   BILITOT 0.6 11/13/2014 0836     Impression and Plan: Ms. Picquet is 55 year old white female with iron deficiency anemia and reactive thrombocytosis. Her platelet count is mildly elevated at 435. We will see what her iron studies show and we can bring her back in next week for Feraheme if needed. She is doing well and is asymptomatic at this time.  We will plan to see her back in 4 months for labs and follow-up.  She will contact us with any questions or concerns. We can certainly see her sooner if need be.    Eliezer Bottom, NP 1/13/201710:15 AM

## 2015-09-01 LAB — RETICULOCYTES: Reticulocyte Count: 1.1 % (ref 0.6–2.6)

## 2015-09-03 ENCOUNTER — Other Ambulatory Visit: Payer: Self-pay

## 2015-09-03 ENCOUNTER — Encounter (HOSPITAL_COMMUNITY): Payer: Self-pay | Admitting: Emergency Medicine

## 2015-09-03 ENCOUNTER — Inpatient Hospital Stay (HOSPITAL_COMMUNITY)
Admission: EM | Admit: 2015-09-03 | Discharge: 2015-09-05 | DRG: 418 | Disposition: A | Discharge status: Home / Self Care | Payer: 59 | Attending: Internal Medicine | Admitting: Internal Medicine

## 2015-09-03 ENCOUNTER — Emergency Department (HOSPITAL_COMMUNITY): Payer: 59

## 2015-09-03 DIAGNOSIS — I10 Essential (primary) hypertension: Secondary | ICD-10-CM | POA: Diagnosis present

## 2015-09-03 DIAGNOSIS — I471 Supraventricular tachycardia: Secondary | ICD-10-CM | POA: Diagnosis present

## 2015-09-03 DIAGNOSIS — J9382 Other air leak: Secondary | ICD-10-CM | POA: Diagnosis present

## 2015-09-03 DIAGNOSIS — J45909 Unspecified asthma, uncomplicated: Secondary | ICD-10-CM | POA: Diagnosis present

## 2015-09-03 DIAGNOSIS — J309 Allergic rhinitis, unspecified: Secondary | ICD-10-CM | POA: Diagnosis present

## 2015-09-03 DIAGNOSIS — R1011 Right upper quadrant pain: Secondary | ICD-10-CM | POA: Diagnosis present

## 2015-09-03 DIAGNOSIS — K851 Biliary acute pancreatitis without necrosis or infection: Principal | ICD-10-CM | POA: Diagnosis present

## 2015-09-03 DIAGNOSIS — K8020 Calculus of gallbladder without cholecystitis without obstruction: Secondary | ICD-10-CM | POA: Diagnosis present

## 2015-09-03 DIAGNOSIS — K219 Gastro-esophageal reflux disease without esophagitis: Secondary | ICD-10-CM | POA: Diagnosis present

## 2015-09-03 DIAGNOSIS — D509 Iron deficiency anemia, unspecified: Secondary | ICD-10-CM | POA: Diagnosis present

## 2015-09-03 HISTORY — DX: Pneumonia, unspecified organism: J18.9

## 2015-09-03 HISTORY — DX: Gastro-esophageal reflux disease without esophagitis: K21.9

## 2015-09-03 HISTORY — DX: Essential (primary) hypertension: I10

## 2015-09-03 HISTORY — DX: Migraine, unspecified, not intractable, without status migrainosus: G43.909

## 2015-09-03 LAB — BASIC METABOLIC PANEL
Anion gap: 9 (ref 5–15)
BUN: 20 mg/dL (ref 6–20)
CO2: 28 mmol/L (ref 22–32)
Calcium: 8.3 mg/dL — ABNORMAL LOW (ref 8.9–10.3)
Chloride: 102 mmol/L (ref 101–111)
Creatinine, Ser: 0.68 mg/dL (ref 0.44–1.00)
Glucose, Bld: 104 mg/dL — ABNORMAL HIGH (ref 65–99)
POTASSIUM: 3.7 mmol/L (ref 3.5–5.1)
SODIUM: 139 mmol/L (ref 135–145)

## 2015-09-03 LAB — CBC
HCT: 40.4 % (ref 36.0–46.0)
Hemoglobin: 13 g/dL (ref 12.0–15.0)
MCH: 27.5 pg (ref 26.0–34.0)
MCHC: 32.2 g/dL (ref 30.0–36.0)
MCV: 85.6 fL (ref 78.0–100.0)
Platelets: 441 10*3/uL — ABNORMAL HIGH (ref 150–400)
RBC: 4.72 MIL/uL (ref 3.87–5.11)
RDW: 13.3 % (ref 11.5–15.5)
WBC: 11.5 10*3/uL — ABNORMAL HIGH (ref 4.0–10.5)

## 2015-09-03 LAB — COMPREHENSIVE METABOLIC PANEL
ALT: 110 U/L — ABNORMAL HIGH (ref 14–54)
ANION GAP: 12 (ref 5–15)
AST: 147 U/L — ABNORMAL HIGH (ref 15–41)
Albumin: 4 g/dL (ref 3.5–5.0)
Alkaline Phosphatase: 82 U/L (ref 38–126)
BILIRUBIN TOTAL: 1.5 mg/dL — AB (ref 0.3–1.2)
BUN: 25 mg/dL — ABNORMAL HIGH (ref 6–20)
CHLORIDE: 99 mmol/L — AB (ref 101–111)
CO2: 29 mmol/L (ref 22–32)
Calcium: 9.1 mg/dL (ref 8.9–10.3)
Creatinine, Ser: 0.8 mg/dL (ref 0.44–1.00)
GFR calc Af Amer: >60 mL/min (ref >60)
Glucose, Bld: 130 mg/dL — ABNORMAL HIGH (ref 65–99)
POTASSIUM: 3.9 mmol/L (ref 3.5–5.1)
Sodium: 140 mmol/L (ref 135–145)
TOTAL PROTEIN: 7.5 g/dL (ref 6.5–8.1)

## 2015-09-03 LAB — I-STAT TROPONIN, ED: Troponin i, poc: 0 ng/mL (ref 0.00–0.08)

## 2015-09-03 LAB — GLUCOSE, CAPILLARY: Glucose-Capillary: 115 mg/dL — ABNORMAL HIGH (ref 65–99)

## 2015-09-03 LAB — URINALYSIS, ROUTINE W REFLEX MICROSCOPIC
BILIRUBIN URINE: NEGATIVE
Glucose, UA: NEGATIVE mg/dL
HGB URINE DIPSTICK: NEGATIVE
KETONES UR: NEGATIVE mg/dL
Leukocytes, UA: NEGATIVE
NITRITE: NEGATIVE
PH: 7 (ref 5.0–8.0)
Protein, ur: NEGATIVE mg/dL
Specific Gravity, Urine: 1.023 (ref 1.005–1.030)

## 2015-09-03 LAB — CBC WITH DIFFERENTIAL/PLATELET
BASOS ABS: 0.1 10*3/uL (ref 0.0–0.1)
Basophils Relative: 1 %
Eosinophils Absolute: 0.2 10*3/uL (ref 0.0–0.7)
Eosinophils Relative: 4 %
HEMATOCRIT: 38.3 % (ref 36.0–46.0)
HEMOGLOBIN: 12.4 g/dL (ref 12.0–15.0)
LYMPHS PCT: 25 %
Lymphs Abs: 1.6 10*3/uL (ref 0.7–4.0)
MCH: 28.1 pg (ref 26.0–34.0)
MCHC: 32.4 g/dL (ref 30.0–36.0)
MCV: 86.7 fL (ref 78.0–100.0)
Monocytes Absolute: 0.5 10*3/uL (ref 0.1–1.0)
Monocytes Relative: 8 %
NEUTROS ABS: 4.2 10*3/uL (ref 1.7–7.7)
Neutrophils Relative %: 62 %
Platelets: 353 10*3/uL (ref 150–400)
RBC: 4.42 MIL/uL (ref 3.87–5.11)
RDW: 13.5 % (ref 11.5–15.5)
WBC: 6.6 10*3/uL (ref 4.0–10.5)

## 2015-09-03 LAB — HEPATIC FUNCTION PANEL
ALBUMIN: 3.5 g/dL (ref 3.5–5.0)
ALT: 253 U/L — AB (ref 14–54)
AST: 352 U/L — ABNORMAL HIGH (ref 15–41)
Alkaline Phosphatase: 89 U/L (ref 38–126)
BILIRUBIN INDIRECT: 1 mg/dL — AB (ref 0.3–0.9)
Bilirubin, Direct: 0.7 mg/dL — ABNORMAL HIGH (ref 0.1–0.5)
TOTAL PROTEIN: 6.6 g/dL (ref 6.5–8.1)
Total Bilirubin: 1.7 mg/dL — ABNORMAL HIGH (ref 0.3–1.2)

## 2015-09-03 LAB — LIPASE, BLOOD: LIPASE: 155 U/L — AB (ref 11–51)

## 2015-09-03 LAB — CBG MONITORING, ED: Glucose-Capillary: 63 mg/dL — ABNORMAL LOW (ref 65–99)

## 2015-09-03 MED ORDER — HYDROMORPHONE HCL 1 MG/ML IJ SOLN
0.5000 mg | INTRAMUSCULAR | Status: DC | PRN
  Administered 2015-09-03 – 2015-09-04 (×3): 0.5 mg via INTRAVENOUS
  Filled 2015-09-03 (×3): qty 1

## 2015-09-03 MED ORDER — ONDANSETRON HCL 4 MG PO TABS: 4.0000 mg | ORAL_TABLET | Freq: Four times a day (QID) | ORAL | Status: DC | PRN

## 2015-09-03 MED ORDER — PIPERACILLIN-TAZOBACTAM 3.375 G IVPB 30 MIN
3.3750 g | Freq: Once | INTRAVENOUS | Status: AC
  Administered 2015-09-03: 3.375 g via INTRAVENOUS
  Filled 2015-09-03: qty 50

## 2015-09-03 MED ORDER — SODIUM CHLORIDE 0.9 % IV SOLN
INTRAVENOUS | Status: DC
  Administered 2015-09-03: 100 mL/h via INTRAVENOUS

## 2015-09-03 MED ORDER — DEXTROSE-NACL 5-0.9 % IV SOLN
INTRAVENOUS | Status: DC
  Administered 2015-09-03: via INTRAVENOUS
  Administered 2015-09-03: 150 mL/h via INTRAVENOUS

## 2015-09-03 MED ORDER — PIPERACILLIN-TAZOBACTAM 3.375 G IVPB
3.3750 g | Freq: Three times a day (TID) | INTRAVENOUS | Status: DC
  Administered 2015-09-03 – 2015-09-04 (×4): 3.375 g via INTRAVENOUS
  Filled 2015-09-03 (×6): qty 50

## 2015-09-03 MED ORDER — MORPHINE SULFATE (PF) 4 MG/ML IV SOLN
4.0000 mg | Freq: Once | INTRAVENOUS | Status: AC
  Administered 2015-09-03: 4 mg via INTRAVENOUS
  Filled 2015-09-03: qty 1

## 2015-09-03 MED ORDER — ACETAMINOPHEN 325 MG PO TABS
650.0000 mg | ORAL_TABLET | Freq: Four times a day (QID) | ORAL | Status: DC | PRN
Start: 2015-09-03 — End: 2015-09-05

## 2015-09-03 MED ORDER — ACETAMINOPHEN 650 MG RE SUPP: 650.0000 mg | Freq: Four times a day (QID) | RECTAL | Status: DC | PRN

## 2015-09-03 MED ORDER — ONDANSETRON HCL 4 MG/2ML IJ SOLN
4.0000 mg | Freq: Once | INTRAMUSCULAR | Status: AC
  Administered 2015-09-03: 4 mg via INTRAVENOUS
  Filled 2015-09-03: qty 2

## 2015-09-03 MED ORDER — HYDRALAZINE HCL 20 MG/ML IJ SOLN: 10.0000 mg | INTRAMUSCULAR | Status: DC | PRN

## 2015-09-03 MED ORDER — METOPROLOL TARTRATE 1 MG/ML IV SOLN
2.5000 mg | Freq: Four times a day (QID) | INTRAVENOUS | Status: DC
  Administered 2015-09-03 – 2015-09-04 (×5): 2.5 mg via INTRAVENOUS
  Filled 2015-09-03 (×5): qty 5

## 2015-09-03 MED ORDER — SODIUM CHLORIDE 0.9 % IV BOLUS (SEPSIS)
1000.0000 mL | Freq: Once | INTRAVENOUS | Status: AC
  Administered 2015-09-03: 1000 mL via INTRAVENOUS

## 2015-09-03 MED ORDER — SODIUM CHLORIDE 0.9 % IV SOLN: INTRAVENOUS | Status: DC

## 2015-09-03 MED ORDER — ONDANSETRON HCL 4 MG/2ML IJ SOLN: 4.0000 mg | Freq: Four times a day (QID) | INTRAMUSCULAR | Status: DC | PRN

## 2015-09-03 MED ORDER — DEXTROSE-NACL 5-0.9 % IV SOLN: INTRAVENOUS | Status: DC

## 2015-09-03 NOTE — Consult Note (Signed)
Reason for Consult: abdominal pain Referring Physician: Dr. Edgar Frisk Danielle Martin is an 55 y.o. female.  HPI: 55 yo female with 3 days of epigastric abdominal pain. Pain is a pressure. The pain also radiates to her back. She has had no previous attacks. She has had nausea, but no vomiting. The pain was much worse after meals.  Past Medical History  Diagnosis Date  . Asthma     Worse in winter  . Allergic rhinitis     Worse in winter , grasses  . Headache(784.0)   . SVT (supraventricular tachycardia) (HCC)     No longer a problem  . HEMORRHAGIC CYSTITIS 05/25/2009    Qualifier: Diagnosis of  By: Regis Bill MD, Standley Brooking   . Iron deficiency anemia 05/01/2015    Past Surgical History  Procedure Laterality Date  . Cesarean section      x 2  . Oophorectomy      right  . Cervical spine surgery    . Abdominal hysterectomy      Family History  Problem Relation Age of Onset  . Prostate cancer Father   . Other Mother     Died of asphyxiation    Social History:  reports that she has never smoked. She has never used smokeless tobacco. She reports that she drinks alcohol. She reports that she does not use illicit drugs.  Allergies:  Allergies  Allergen Reactions  . Codeine Anaphylaxis    Hallucinations  . Ace Inhibitors Cough    Medications: I have reviewed the patient's current medications.  Results for orders placed or performed during the hospital encounter of 09/03/15 (from the past 48 hour(s))  Urinalysis, Routine w reflex microscopic (not at [facility])     Status: Abnormal   Collection Time: 09/03/15 12:36 AM  Result Value Ref Range   Color, Urine YELLOW YELLOW   APPearance CLOUDY (A) CLEAR   Specific Gravity, Urine 1.023 1.005 - 1.030   pH 7.0 5.0 - 8.0   Glucose, UA NEGATIVE NEGATIVE mg/dL   Hgb urine dipstick NEGATIVE NEGATIVE   Bilirubin Urine NEGATIVE NEGATIVE   Ketones, ur NEGATIVE NEGATIVE mg/dL   Protein, ur NEGATIVE NEGATIVE mg/dL   Nitrite NEGATIVE NEGATIVE    Leukocytes, UA NEGATIVE NEGATIVE    Comment: MICROSCOPIC NOT DONE ON URINES WITH NEGATIVE PROTEIN, BLOOD, LEUKOCYTES, NITRITE, OR GLUCOSE <1000 mg/dL.  I-stat troponin, ED (not at [facility], [facility])     Status: None   Collection Time: 09/03/15 12:47 AM  Result Value Ref Range   Troponin i, poc 0.00 0.00 - 0.08 ng/mL   Comment 3            Comment: Due to the release kinetics of cTnI, a negative result within the first hours of the onset of symptoms does not rule out myocardial infarction with certainty. If myocardial infarction is still suspected, repeat the test at appropriate intervals.   Lipase, blood     Status: Abnormal   Collection Time: 09/03/15 12:54 AM  Result Value Ref Range   Lipase 155 (H) 11 - 51 U/L  Comprehensive metabolic panel     Status: Abnormal   Collection Time: 09/03/15 12:54 AM  Result Value Ref Range   Sodium 140 135 - 145 mmol/L   Potassium 3.9 3.5 - 5.1 mmol/L   Chloride 99 (L) 101 - 111 mmol/L   CO2 29 22 - 32 mmol/L   Glucose, Bld 130 (H) 65 - 99 mg/dL   BUN 25 (H) 6 -  20 mg/dL   Creatinine, Ser 0.80 0.44 - 1.00 mg/dL   Calcium 9.1 8.9 - 10.3 mg/dL   Total Protein 7.5 6.5 - 8.1 g/dL   Albumin 4.0 3.5 - 5.0 g/dL   AST 147 (H) 15 - 41 U/L   ALT 110 (H) 14 - 54 U/L   Alkaline Phosphatase 82 38 - 126 U/L   Total Bilirubin 1.5 (H) 0.3 - 1.2 mg/dL   GFR calc non Af Amer >60 >60 mL/min   GFR calc Af Amer >60 >60 mL/min    Comment: (NOTE) The eGFR has been calculated using the CKD EPI equation. This calculation has not been validated in all clinical situations. eGFR's persistently <60 mL/min signify possible Chronic Kidney Disease.    Anion gap 12 5 - 15  CBC     Status: Abnormal   Collection Time: 09/03/15 12:54 AM  Result Value Ref Range   WBC 11.5 (H) 4.0 - 10.5 K/uL   RBC 4.72 3.87 - 5.11 MIL/uL   Hemoglobin 13.0 12.0 - 15.0 g/dL   HCT 40.4 36.0 - 46.0 %   MCV 85.6 78.0 - 100.0 fL   MCH 27.5 26.0 - 34.0 pg   MCHC 32.2 30.0 - 36.0 g/dL   RDW 13.3  11.5 - 15.5 %   Platelets 441 (H) 150 - 400 K/uL    US Abdomen Complete  09/03/2015  CLINICAL DATA:  55 year old female with right upper quadrant abdominal pain and increased liver function tests and lipase. EXAM: ABDOMEN ULTRASOUND COMPLETE COMPARISON:  None. FINDINGS: Gallbladder: There multiple stones within the gallbladder. No gallbladder wall thickening or pericholecystic fluid. Negative sonographic Murphy's sign. Common bile duct: Diameter: 6 mm Liver: No focal lesion identified. Within normal limits in parenchymal echogenicity. IVC: No abnormality visualized. Pancreas: Visualized portion unremarkable. Spleen: Size and appearance within normal limits. Right Kidney: Length: 13 cm. Echogenicity within normal limits. No mass or hydronephrosis visualized. Left Kidney: Length: 13 cm. Echogenicity within normal limits. No mass or hydronephrosis visualized. Abdominal aorta: No aneurysm visualized. Other findings: None. IMPRESSION: Cholelithiasis without sonographic evidence of acute cholecystitis. No other sonographic abnormalities identified. Electronically Signed   By: Anner Crete M.D.   On: 09/03/2015 04:11    Review of Systems  Constitutional: Negative for fever and chills.  HENT: Negative for hearing loss.   Eyes: Negative for blurred vision and double vision.  Respiratory: Negative for cough and hemoptysis.   Cardiovascular: Negative for chest pain and palpitations.  Gastrointestinal: Positive for nausea and abdominal pain. Negative for vomiting.  Genitourinary: Negative for dysuria and urgency.  Musculoskeletal: Negative for myalgias and neck pain.  Skin: Negative for itching and rash.  Neurological: Negative for dizziness, tingling and headaches.  Endo/Heme/Allergies: Does not bruise/bleed easily.  Psychiatric/Behavioral: Negative for depression and suicidal ideas.   Blood pressure 136/77, pulse 77, temperature 97.6 F (36.4 C), temperature source Oral, resp. rate 14, SpO2 94  %. Physical Exam  Vitals reviewed. Constitutional: She is oriented to person, place, and time. She appears well-developed and well-nourished.  HENT:  Head: Normocephalic and atraumatic.  Eyes: Conjunctivae and EOM are normal. Pupils are equal, round, and reactive to light.  Neck: Normal range of motion. Neck supple.  Cardiovascular: Normal rate and regular rhythm.   Respiratory: Effort normal and breath sounds normal.  GI: Soft. Bowel sounds are normal. She exhibits no distension. There is tenderness in the right upper quadrant and epigastric area. There is negative Murphy's sign.  Musculoskeletal: Normal range of motion.  Neurological:  She is alert and oriented to person, place, and time.  Skin: Skin is warm and dry.  Psychiatric: She has a normal mood and affect. Her behavior is normal.    Assessment/Plan: 55 yo female with gallstone pancreatitis vs choledocholithiasis. -IVF -Pain control -NPO -recheck labs tomorrow -Likely need gallbladder out prior to discharge due to complication of gallstone disease  Danielle Martin 09/03/2015, 6:29 AM

## 2015-09-03 NOTE — ED Notes (Signed)
Family at bedside. 

## 2015-09-03 NOTE — H&P (Signed)
[facility] History and Physical  LELER DRAPER [id number] DOB: [birth year] DOA: 09/03/2015  Referring physician: Dr.Little. PCP: Lottie Dawson, MD  Specialists: None.  Chief Complaint: Abdominal pain.  HPI: Danielle Martin is a 55 y.o. female with history of hypertension, SVT and asthma presents to the ER because of abdominal pain. Patient has been experiencing abdominal pain mostly in the upper quadrants since Friday, last 3 days. Has been having some nausea and denies any vomiting or diarrhea. Denies any fever or chills. In the ER patient's labs revealed mildly elevated LFTs with lipase. Sonogram of the abdomen shows gallstones. On exam patient has tenderness in epigastric and upper quadrants. Patient has been admitted for gallstone pancreatitis. Patient drinks alcohol only occasionally. Denies any chest pain or shortness of breath.   Review of Systems: As presented in the history of presenting illness, rest negative.  Past Medical History  Diagnosis Date  . Asthma     Worse in winter  . Allergic rhinitis     Worse in winter , grasses  . Headache(784.0)   . SVT (supraventricular tachycardia) (HCC)     No longer a problem  . HEMORRHAGIC CYSTITIS 05/25/2009    Qualifier: Diagnosis of  By: Regis Bill MD, Standley Brooking   . Iron deficiency anemia 05/01/2015   Past Surgical History  Procedure Laterality Date  . Cesarean section      x 2  . Oophorectomy      right  . Cervical spine surgery    . Abdominal hysterectomy     Social History:  reports that she has never smoked. She has never used smokeless tobacco. She reports that she drinks alcohol. She reports that she does not use illicit drugs. Where does patient live home. Can patient participate in ADLs? Yes.  Allergies  Allergen Reactions  . Codeine Anaphylaxis    Hallucinations  . Ace Inhibitors Cough    Family History:  Family History  Problem Relation Age of Onset  . Prostate cancer Father   . Other Mother     Died  of asphyxiation      Prior to Admission medications   Medication Sig Start Date End Date Taking? Authorizing Provider  ADVAIR DISKUS 100-50 MCG/DOSE AEPB USE ONE INHALATION INTO THE LUNGS TWO TIMES A DAY OR AS DIRECTED    Burnis Medin, MD  albuterol (PROVENTIL HFA;VENTOLIN HFA) 108 (90 BASE) MCG/ACT inhaler Inhale 2 puffs into the lungs every 6 (six) hours as needed for wheezing. 10/26/13   Burnis Medin, MD  albuterol (PROVENTIL) (2.5 MG/3ML) 0.083% nebulizer solution Take 3 mLs (2.5 mg total) by nebulization every 6 (six) hours as needed for wheezing or shortness of breath. 11/25/13   Doe-Hyun R Shawna Orleans, DO  Calcium Carbonate-Vitamin D 600-400 MG-UNIT per chew tablet Chew 1 tablet by mouth daily.    Historical Provider, MD  CINNAMON PO Take 1,000 Units by mouth every morning.    Historical Provider, MD  fluticasone (FLONASE) 50 MCG/ACT nasal spray Place 2 sprays into both nostrils daily. 2 each nostril 10/26/13 10/26/14  Burnis Medin, MD  losartan-hydrochlorothiazide (HYZAAR) 50-12.5 MG tablet Take 1 tablet by mouth daily. 06/08/15   Burnis Medin, MD  metoprolol succinate (TOPROL-XL) 50 MG 24 hr tablet Take 1 tablet (50 mg total) by mouth daily. Take with or immediately following a meal. 11/20/14   Burnis Medin, MD  Multiple Vitamin (MULTI-VITAMIN PO) Take by mouth.    Historical Provider, MD  vitamin E 400 UNIT capsule  Take 400 Units by mouth daily.    Historical Provider, MD    Physical Exam: Filed Vitals:   09/03/15 0427 09/03/15 0430 09/03/15 0500 09/03/15 0530  BP: 147/75 137/77 137/74 133/72  Pulse: 71 72 71 77  Temp:      TempSrc:      Resp: 14     SpO2: 98% 99% 97% 94%     General:  Moderately built and nourished.  Eyes: Anicteric no pallor.  ENT: No discharge from the ears eyes nose and mouth.  Neck: No mass felt. No neck rigidity.  Cardiovascular: S1-S2 heard.  Respiratory: No rhonchi or crepitations.  Abdomen: Mild tenderness in the epigastric and upper quadrants.  No guarding or rigidity.  Skin: No rash.  Musculoskeletal: No edema.  Psychiatric: Appears normal.  Neurologic: Alert awake oriented to time place and person. Moves all extremities.  Labs on Admission:  Basic Metabolic Panel:  Recent Labs Lab 09/03/15 0054  NA 140  K 3.9  CL 99*  CO2 29  GLUCOSE 130*  BUN 25*  CREATININE 0.80  CALCIUM 9.1   Liver Function Tests:  Recent Labs Lab 09/03/15 0054  AST 147*  ALT 110*  ALKPHOS 82  BILITOT 1.5*  PROT 7.5  ALBUMIN 4.0    Recent Labs Lab 09/03/15 0054  LIPASE 155*   No results for input(s): AMMONIA in the last 168 hours. CBC:  Recent Labs Lab 08/31/15 0941 09/03/15 0054  WBC 7.0 11.5*  NEUTROABS 4.1  --   HGB 13.4 13.0  HCT 42.6 40.4  MCV 88 85.6  PLT 435* 441*   Cardiac Enzymes: No results for input(s): CKTOTAL, CKMB, CKMBINDEX, TROPONINI in the last 168 hours.  BNP (last 3 results) No results for input(s): BNP in the last 8760 hours.  ProBNP (last 3 results) No results for input(s): PROBNP in the last 8760 hours.  CBG: No results for input(s): GLUCAP in the last 168 hours.  Radiological Exams on Admission: US Abdomen Complete  09/03/2015  CLINICAL DATA:  55 year old female with right upper quadrant abdominal pain and increased liver function tests and lipase. EXAM: ABDOMEN ULTRASOUND COMPLETE COMPARISON:  None. FINDINGS: Gallbladder: There multiple stones within the gallbladder. No gallbladder wall thickening or pericholecystic fluid. Negative sonographic Murphy's sign. Common bile duct: Diameter: 6 mm Liver: No focal lesion identified. Within normal limits in parenchymal echogenicity. IVC: No abnormality visualized. Pancreas: Visualized portion unremarkable. Spleen: Size and appearance within normal limits. Right Kidney: Length: 13 cm. Echogenicity within normal limits. No mass or hydronephrosis visualized. Left Kidney: Length: 13 cm. Echogenicity within normal limits. No mass or hydronephrosis  visualized. Abdominal aorta: No aneurysm visualized. Other findings: None. IMPRESSION: Cholelithiasis without sonographic evidence of acute cholecystitis. No other sonographic abnormalities identified. Electronically Signed   By: Anner Crete M.D.   On: 09/03/2015 04:11     Assessment/Plan Principal Problem:   Gallstone pancreatitis Active Problems:   Hypertension   1. Gallstone pancreatitis - I have discussed with on-call surgeon Dr. Kieth Brightly will be seeing patient in consult. Patient will be kept nothing by mouth. He had physician also had discussed with on-call gastroenterologist Dr. Collene Mares will be seeing patient in consult. For now patient will be on empiric antibiotics. Follow LFTs and lipase metabolic panel. Pain relief medications. IV fluids. 2. History of hypertension and SVT - since patient is nothing by mouth I have placed patient on scheduled dose of metoprolol IV and when necessary IV hydralazine. 3. History of asthma presently not wheezing.  DVT Prophylaxis SCDs in anticipation of procedure.  Code Status: Full code.  Family Communication: Patient's husband at the bedside.  Disposition Plan: Admit to inpatient.    Benetta Maclaren N. [facility] Pager [phone].  If 7PM-7AM, please contact night-coverage [email or web address] Password [facility] 09/03/2015, 5:57 AM

## 2015-09-03 NOTE — Progress Notes (Signed)
ANTIBIOTIC CONSULT NOTE - INITIAL  Pharmacy Consult for Zosyn Indication: Intra-abdominal infection  Allergies  Allergen Reactions  . Codeine Anaphylaxis    Hallucinations  . Ace Inhibitors Cough    Patient Measurements:   Adjusted Body Weight:   Vital Signs: Temp: 97.7 F (36.5 C) (01/16 0642) Temp Source: Oral (01/16 0642) BP: 150/84 mmHg (01/16 0700) Pulse Rate: 73 (01/16 0700) Intake/Output from previous day: 01/15 0701 - 01/16 0700 In: 1000 [I.V.:1000] Out: -  Intake/Output from this shift:    Labs:  Recent Labs  08/31/15 0941 09/03/15 0054  WBC 7.0 11.5*  HGB 13.4 13.0  PLT 435* 441*  CREATININE  --  0.80   Estimated Creatinine Clearance: 103.4 mL/min (by C-G formula based on Cr of 0.8). No results for input(s): VANCOTROUGH, VANCOPEAK, VANCORANDOM, GENTTROUGH, GENTPEAK, GENTRANDOM, TOBRATROUGH, TOBRAPEAK, TOBRARND, AMIKACINPEAK, AMIKACINTROU, AMIKACIN in the last 72 hours.   Microbiology: No results found for this or any previous visit (from the past 720 hour(s)).  Medical History: Past Medical History  Diagnosis Date  . Asthma     Worse in winter  . Allergic rhinitis     Worse in winter , grasses  . Headache(784.0)   . SVT (supraventricular tachycardia) (HCC)     No longer a problem  . HEMORRHAGIC CYSTITIS 05/25/2009    Qualifier: Diagnosis of  By: Regis Bill MD, Standley Brooking   . Iron deficiency anemia 05/01/2015    Assessment: 43 yof with epigastric pain x3 days. Pharmacy consulted to dose Zosyn for intra-abdominal infection. No previous hx, likely gallstone pancreatitis and will need surgery prior to d/c. Afeb, WBC 11.5 on admit. No cultures. SCr 0.8, eCrCl~100.  1/16 Zosyn>>  Goal of Therapy:  Eradication of infection  Plan:  Zosyn 3.375g IV (69min inf) x1; then Zosyn 3.375g IV q8h Monitor clinical progress, c/s, renal function, abx plan/LOT  Elicia Lamp, PharmD, [facility] Clinical Pharmacist Pager [phone] 09/03/2015 8:16 AM

## 2015-09-03 NOTE — ED Notes (Signed)
Upper abd pain that radiates to back since Friday with nausea.  Only ate a bagel yesterday.  Pain worse after eating a full meal tonight.

## 2015-09-03 NOTE — ED Notes (Signed)
Patient transported to Ultrasound 

## 2015-09-03 NOTE — ED Notes (Signed)
CBG 63  

## 2015-09-03 NOTE — Progress Notes (Signed)
I have seen and assessed patient and agree with Dr. Cathlean Marseilles assessment and plan. Patient presented with a gallstone pancreatitis. General surgery following. GI has been consulted and consult is pending. Continue IV fluids, bowel rest, pain management. Continue to monitor LFTs and lipase levels. Once acute pancreatitis" of/resolved patient will likely need a cholecystectomy. General surgery following.

## 2015-09-03 NOTE — ED Notes (Signed)
Pt cbg 63.  Dr Grandville Silos paged.

## 2015-09-03 NOTE — ED Notes (Signed)
Attempted report 

## 2015-09-03 NOTE — Progress Notes (Signed)
Pt admitted to 6N22 via stretcher from ED.  Pt AAO X3.  Pt on RA.  Pt has 20G to [facility] with fluids infusing.  Pt has family and belongings to bedside.  Pt has no complaints at the moment.  Will continue to monitor.

## 2015-09-03 NOTE — ED Provider Notes (Signed)
CSN: [id number]     Arrival date & time 09/03/15  0015 History   First MD Initiated Contact with Patient 09/03/15 0134     Chief Complaint  Patient presents with  . Abdominal Pain     (Consider location/radiation/quality/duration/timing/severity/associated sxs/prior Treatment) HPI Comments: 55yo F w/ PMH including asthma, hysterectomy who p/w abd pain. The patient reports that 2 days ago she began having upper abdominal pain that is intermittent, severe, and radiates to her back. The pain is worse after eating. The pain improved yesterday because she did not eat but tonight she ate a full meal and afterwards began having severe upper abdominal pain again. She endorses nausea but denies any vomiting, diarrhea, or blood in her stool. No urinary symptoms. She has never had this pain before.  Patient is a 55 y.o. female presenting with abdominal pain. The history is provided by the patient.  Abdominal Pain   Past Medical History  Diagnosis Date  . Asthma     Worse in winter  . Allergic rhinitis     Worse in winter , grasses  . Headache(784.0)   . SVT (supraventricular tachycardia) (HCC)     No longer a problem  . HEMORRHAGIC CYSTITIS 05/25/2009    Qualifier: Diagnosis of  By: Regis Bill MD, Standley Brooking   . Iron deficiency anemia 05/01/2015   Past Surgical History  Procedure Laterality Date  . Cesarean section      x 2  . Oophorectomy      right  . Cervical spine surgery    . Abdominal hysterectomy     Family History  Problem Relation Age of Onset  . Prostate cancer Father   . Other Mother     Died of asphyxiation   Social History  Substance Use Topics  . Smoking status: Never Smoker   . Smokeless tobacco: Never Used     Comment: NEVER USED TOBACCO  . Alcohol Use: 0.0 oz/week    2-3 Glasses of wine per week   OB History    No data available     Review of Systems  Gastrointestinal: Positive for abdominal pain.   10 Systems reviewed and are negative for acute change except  as noted in the HPI.    Allergies  Codeine and Ace inhibitors  Home Medications   Prior to Admission medications   Medication Sig Start Date End Date Taking? Authorizing Provider  ADVAIR DISKUS 100-50 MCG/DOSE AEPB USE ONE INHALATION INTO THE LUNGS TWO TIMES A DAY OR AS DIRECTED    Burnis Medin, MD  albuterol (PROVENTIL HFA;VENTOLIN HFA) 108 (90 BASE) MCG/ACT inhaler Inhale 2 puffs into the lungs every 6 (six) hours as needed for wheezing. 10/26/13   Burnis Medin, MD  albuterol (PROVENTIL) (2.5 MG/3ML) 0.083% nebulizer solution Take 3 mLs (2.5 mg total) by nebulization every 6 (six) hours as needed for wheezing or shortness of breath. 11/25/13   Doe-Hyun R Shawna Orleans, DO  Calcium Carbonate-Vitamin D 600-400 MG-UNIT per chew tablet Chew 1 tablet by mouth daily.    Historical Provider, MD  CINNAMON PO Take 1,000 Units by mouth every morning.    Historical Provider, MD  fluticasone (FLONASE) 50 MCG/ACT nasal spray Place 2 sprays into both nostrils daily. 2 each nostril 10/26/13 10/26/14  Burnis Medin, MD  losartan-hydrochlorothiazide (HYZAAR) 50-12.5 MG tablet Take 1 tablet by mouth daily. 06/08/15   Burnis Medin, MD  metoprolol succinate (TOPROL-XL) 50 MG 24 hr tablet Take 1 tablet (50 mg total) by  mouth daily. Take with or immediately following a meal. 11/20/14   Burnis Medin, MD  Multiple Vitamin (MULTI-VITAMIN PO) Take by mouth.    Historical Provider, MD  vitamin E 400 UNIT capsule Take 400 Units by mouth daily.    Historical Provider, MD   BP 147/75 mmHg  Pulse 71  Temp(Src) 97.6 F (36.4 C) (Oral)  Resp 14  SpO2 98% Physical Exam  Constitutional: She is oriented to person, place, and time. She appears well-developed and well-nourished.  Uncomfortable, sitting forward, and in mild distress due to pain  HENT:  Head: Normocephalic and atraumatic.  Moist mucous membranes  Eyes: Conjunctivae are normal. Pupils are equal, round, and reactive to light.  Neck: Neck supple.   Cardiovascular: Normal rate, regular rhythm and normal heart sounds.   No murmur heard. Pulmonary/Chest: Effort normal and breath sounds normal.  Abdominal: Soft. Bowel sounds are normal. She exhibits no distension. There is no rebound and no guarding.  Tenderness to palpation of midepigastrium and right upper quadrant  Musculoskeletal: She exhibits no edema.  Neurological: She is alert and oriented to person, place, and time.  Fluent speech  Skin: Skin is warm and dry.  Psychiatric: She has a normal mood and affect. Judgment normal.  Distressed  Nursing note and vitals reviewed.   ED Course  Procedures (including critical care time) Labs Review Labs Reviewed  LIPASE, BLOOD - Abnormal; Notable for the following:    Lipase 155 (*)    All other components within normal limits  COMPREHENSIVE METABOLIC PANEL - Abnormal; Notable for the following:    Chloride 99 (*)    Glucose, Bld 130 (*)    BUN 25 (*)    AST 147 (*)    ALT 110 (*)    Total Bilirubin 1.5 (*)    All other components within normal limits  CBC - Abnormal; Notable for the following:    WBC 11.5 (*)    Platelets 441 (*)    All other components within normal limits  URINALYSIS, ROUTINE W REFLEX MICROSCOPIC (NOT AT [facility]) - Abnormal; Notable for the following:    APPearance CLOUDY (*)    All other components within normal limits  I-STAT TROPOININ, ED    Imaging Review US Abdomen Complete  09/03/2015  CLINICAL DATA:  55 year old female with right upper quadrant abdominal pain and increased liver function tests and lipase. EXAM: ABDOMEN ULTRASOUND COMPLETE COMPARISON:  None. FINDINGS: Gallbladder: There multiple stones within the gallbladder. No gallbladder wall thickening or pericholecystic fluid. Negative sonographic Murphy's sign. Common bile duct: Diameter: 6 mm Liver: No focal lesion identified. Within normal limits in parenchymal echogenicity. IVC: No abnormality visualized. Pancreas: Visualized portion  unremarkable. Spleen: Size and appearance within normal limits. Right Kidney: Length: 13 cm. Echogenicity within normal limits. No mass or hydronephrosis visualized. Left Kidney: Length: 13 cm. Echogenicity within normal limits. No mass or hydronephrosis visualized. Abdominal aorta: No aneurysm visualized. Other findings: None. IMPRESSION: Cholelithiasis without sonographic evidence of acute cholecystitis. No other sonographic abnormalities identified. Electronically Signed   By: Anner Crete M.D.   On: 09/03/2015 04:11   I have personally reviewed and evaluated these lab results as part of my medical decision-making.   EKG Interpretation None     Medications  sodium chloride 0.9 % bolus 1,000 mL (0 mLs Intravenous Stopped 09/03/15 0258)  ondansetron (ZOFRAN) injection 4 mg (4 mg Intravenous Given 09/03/15 0212)  morphine 4 MG/ML injection 4 mg (4 mg Intravenous Given 09/03/15 0212)  MDM   Final diagnoses:  Gallstone pancreatitis   patient with 2 days of upper abdominal pain that was initially intermittent after eating but has been constant and severe tonight after dinner. On exam, she was in mild distress due to pain. Vital signs stable. Midepigastric and right upper quadrant tenderness to palpation, no peritonitis. Gave the patient morphine, Zofran, and IV fluid bolus. Labwork notable for AST 147, ALT 110, bilirubin 1.5, lipase 155. Labs concerning for obstructing stone, therefore obtained abdominal ultrasound.  Ultrasound showed cholelithiasis without any evidence of cholecystitis, CBD 6 mm. On reexamination after morphine, pt comfortable. Because of concern for gallstone pancreatitis, consulted GI, Dr. Collene Mares, who will evaluate pt. Discussed admission w/ hospitalist, Dr. Hal Hope, and pt admitted for further care.  Sharlett Iles, MD 09/03/15 [phone]

## 2015-09-04 ENCOUNTER — Inpatient Hospital Stay (HOSPITAL_COMMUNITY): Payer: 59 | Admitting: Certified Registered Nurse Anesthetist

## 2015-09-04 ENCOUNTER — Inpatient Hospital Stay (HOSPITAL_COMMUNITY): Payer: 59

## 2015-09-04 ENCOUNTER — Encounter (HOSPITAL_COMMUNITY)
Admission: EM | Disposition: A | Discharge status: Home / Self Care | Payer: Self-pay | Source: Home / Self Care | Attending: Internal Medicine

## 2015-09-04 HISTORY — PX: CHOLECYSTECTOMY: SHX55

## 2015-09-04 LAB — COMPREHENSIVE METABOLIC PANEL
ALT: 220 U/L — ABNORMAL HIGH (ref 14–54)
AST: 168 U/L — AB (ref 15–41)
Albumin: 3.4 g/dL — ABNORMAL LOW (ref 3.5–5.0)
Alkaline Phosphatase: 77 U/L (ref 38–126)
Anion gap: 8 (ref 5–15)
BILIRUBIN TOTAL: 1.1 mg/dL (ref 0.3–1.2)
BUN: 15 mg/dL (ref 6–20)
CO2: 28 mmol/L (ref 22–32)
CREATININE: 0.67 mg/dL (ref 0.44–1.00)
Calcium: 8.2 mg/dL — ABNORMAL LOW (ref 8.9–10.3)
Chloride: 106 mmol/L (ref 101–111)
Glucose, Bld: 129 mg/dL — ABNORMAL HIGH (ref 65–99)
POTASSIUM: 3.6 mmol/L (ref 3.5–5.1)
Sodium: 142 mmol/L (ref 135–145)
TOTAL PROTEIN: 6.5 g/dL (ref 6.5–8.1)

## 2015-09-04 LAB — LIPASE, BLOOD: LIPASE: 47 U/L (ref 11–51)

## 2015-09-04 LAB — CBC
HEMATOCRIT: 39.3 % (ref 36.0–46.0)
Hemoglobin: 12.7 g/dL (ref 12.0–15.0)
MCH: 27.6 pg (ref 26.0–34.0)
MCHC: 32.3 g/dL (ref 30.0–36.0)
MCV: 85.4 fL (ref 78.0–100.0)
Platelets: 361 10*3/uL (ref 150–400)
RBC: 4.6 MIL/uL (ref 3.87–5.11)
RDW: 13.7 % (ref 11.5–15.5)
WBC: 5.9 10*3/uL (ref 4.0–10.5)

## 2015-09-04 LAB — MAGNESIUM: MAGNESIUM: 1.9 mg/dL (ref 1.7–2.4)

## 2015-09-04 LAB — GLUCOSE, CAPILLARY
Glucose-Capillary: 119 mg/dL — ABNORMAL HIGH (ref 65–99)
Glucose-Capillary: 95 mg/dL (ref 65–99)

## 2015-09-04 LAB — SURGICAL PCR SCREEN
MRSA, PCR: NEGATIVE
Staphylococcus aureus: NEGATIVE

## 2015-09-04 SURGERY — LAPAROSCOPIC CHOLECYSTECTOMY WITH INTRAOPERATIVE CHOLANGIOGRAM
Anesthesia: General | Site: Abdomen

## 2015-09-04 MED ORDER — SODIUM CHLORIDE 0.9 % IR SOLN
Status: DC | PRN
  Administered 2015-09-04: 1000 mL

## 2015-09-04 MED ORDER — ONDANSETRON HCL 4 MG/2ML IJ SOLN
INTRAMUSCULAR | Status: AC
  Filled 2015-09-04: qty 2

## 2015-09-04 MED ORDER — 0.9 % SODIUM CHLORIDE (POUR BTL) OPTIME
TOPICAL | Status: DC | PRN
  Administered 2015-09-04: 1000 mL

## 2015-09-04 MED ORDER — KETOROLAC TROMETHAMINE 30 MG/ML IJ SOLN
INTRAMUSCULAR | Status: DC | PRN
  Administered 2015-09-04: 30 mg via INTRAVENOUS

## 2015-09-04 MED ORDER — BUPIVACAINE-EPINEPHRINE 0.25% -1:200000 IJ SOLN
INTRAMUSCULAR | Status: DC | PRN
  Administered 2015-09-04: 30 mL

## 2015-09-04 MED ORDER — ONDANSETRON HCL 4 MG/2ML IJ SOLN
4.0000 mg | Freq: Once | INTRAMUSCULAR | Status: AC | PRN
  Administered 2015-09-04: 4 mg via INTRAVENOUS

## 2015-09-04 MED ORDER — FENTANYL CITRATE (PF) 100 MCG/2ML IJ SOLN
INTRAMUSCULAR | Status: DC | PRN
  Administered 2015-09-04 (×2): 50 ug via INTRAVENOUS
  Administered 2015-09-04: 150 ug via INTRAVENOUS

## 2015-09-04 MED ORDER — METOPROLOL SUCCINATE ER 50 MG PO TB24
50.0000 mg | ORAL_TABLET | Freq: Every evening | ORAL | Status: DC
  Administered 2015-09-04: 50 mg via ORAL
  Filled 2015-09-04: qty 1

## 2015-09-04 MED ORDER — PROMETHAZINE HCL 25 MG/ML IJ SOLN
INTRAMUSCULAR | Status: AC
  Filled 2015-09-04: qty 1

## 2015-09-04 MED ORDER — DOCUSATE SODIUM 100 MG PO CAPS
100.0000 mg | ORAL_CAPSULE | Freq: Two times a day (BID) | ORAL | Status: DC
  Administered 2015-09-04 – 2015-09-05 (×2): 100 mg via ORAL
  Filled 2015-09-04 (×2): qty 1

## 2015-09-04 MED ORDER — LIDOCAINE HCL (CARDIAC) 20 MG/ML IV SOLN
INTRAVENOUS | Status: DC | PRN
  Administered 2015-09-04: 60 mg via INTRAVENOUS

## 2015-09-04 MED ORDER — MEPERIDINE HCL 25 MG/ML IJ SOLN: 6.2500 mg | INTRAMUSCULAR | Status: DC | PRN

## 2015-09-04 MED ORDER — HYDROMORPHONE HCL 1 MG/ML IJ SOLN
0.2500 mg | INTRAMUSCULAR | Status: DC | PRN
  Administered 2015-09-04 (×4): 0.5 mg via INTRAVENOUS

## 2015-09-04 MED ORDER — PHENYLEPHRINE HCL 10 MG/ML IJ SOLN
INTRAMUSCULAR | Status: DC | PRN
  Administered 2015-09-04 (×3): 80 ug via INTRAVENOUS

## 2015-09-04 MED ORDER — DEXTROSE-NACL 5-0.9 % IV SOLN
INTRAVENOUS | Status: DC
  Administered 2015-09-04 (×2): via INTRAVENOUS

## 2015-09-04 MED ORDER — HYDROMORPHONE HCL 1 MG/ML IJ SOLN
INTRAMUSCULAR | Status: AC
  Filled 2015-09-04: qty 1

## 2015-09-04 MED ORDER — ALBUTEROL SULFATE (2.5 MG/3ML) 0.083% IN NEBU: 2.5000 mg | INHALATION_SOLUTION | Freq: Four times a day (QID) | RESPIRATORY_TRACT | Status: DC | PRN

## 2015-09-04 MED ORDER — LIDOCAINE HCL (CARDIAC) 20 MG/ML IV SOLN
INTRAVENOUS | Status: AC
  Filled 2015-09-04: qty 5

## 2015-09-04 MED ORDER — ONDANSETRON HCL 4 MG/2ML IJ SOLN
INTRAMUSCULAR | Status: DC | PRN
  Administered 2015-09-04: 4 mg via INTRAVENOUS

## 2015-09-04 MED ORDER — HYDROMORPHONE HCL 1 MG/ML IJ SOLN
0.5000 mg | INTRAMUSCULAR | Status: AC | PRN
  Administered 2015-09-04 (×2): 0.5 mg via INTRAVENOUS

## 2015-09-04 MED ORDER — MIDAZOLAM HCL 5 MG/5ML IJ SOLN
INTRAMUSCULAR | Status: DC | PRN
  Administered 2015-09-04: 2 mg via INTRAVENOUS

## 2015-09-04 MED ORDER — MIDAZOLAM HCL 2 MG/2ML IJ SOLN
INTRAMUSCULAR | Status: AC
  Filled 2015-09-04: qty 2

## 2015-09-04 MED ORDER — PROPOFOL 10 MG/ML IV BOLUS
INTRAVENOUS | Status: AC
  Filled 2015-09-04: qty 20

## 2015-09-04 MED ORDER — LACTATED RINGERS IV SOLN
INTRAVENOUS | Status: DC
  Administered 2015-09-04 (×3): via INTRAVENOUS

## 2015-09-04 MED ORDER — PROMETHAZINE HCL 25 MG/ML IJ SOLN
6.2500 mg | Freq: Four times a day (QID) | INTRAMUSCULAR | Status: AC | PRN
  Administered 2015-09-04: 6.25 mg via INTRAVENOUS

## 2015-09-04 MED ORDER — LOSARTAN POTASSIUM 50 MG PO TABS
50.0000 mg | ORAL_TABLET | Freq: Every day | ORAL | Status: DC
  Administered 2015-09-04 – 2015-09-05 (×2): 50 mg via ORAL
  Filled 2015-09-04 (×2): qty 1

## 2015-09-04 MED ORDER — MOMETASONE FURO-FORMOTEROL FUM 100-5 MCG/ACT IN AERO
2.0000 | INHALATION_SPRAY | Freq: Two times a day (BID) | RESPIRATORY_TRACT | Status: DC
  Filled 2015-09-04: qty 8.8

## 2015-09-04 MED ORDER — POLYETHYLENE GLYCOL 3350 17 G PO PACK
17.0000 g | PACK | Freq: Every day | ORAL | Status: DC
  Administered 2015-09-04 – 2015-09-05 (×2): 17 g via ORAL
  Filled 2015-09-04 (×2): qty 1

## 2015-09-04 MED ORDER — EPHEDRINE SULFATE 50 MG/ML IJ SOLN
INTRAMUSCULAR | Status: DC | PRN
  Administered 2015-09-04: 5 mg via INTRAVENOUS

## 2015-09-04 MED ORDER — OXYCODONE-ACETAMINOPHEN 5-325 MG PO TABS
1.0000 | ORAL_TABLET | ORAL | Status: DC | PRN
  Administered 2015-09-04: 1 via ORAL
  Administered 2015-09-04 – 2015-09-05 (×2): 2 via ORAL
  Administered 2015-09-05: 1 via ORAL
  Filled 2015-09-04: qty 1
  Filled 2015-09-04: qty 2
  Filled 2015-09-04: qty 1
  Filled 2015-09-04: qty 2

## 2015-09-04 MED ORDER — GLYCOPYRROLATE 0.2 MG/ML IJ SOLN
INTRAMUSCULAR | Status: DC | PRN
  Administered 2015-09-04: 0.6 mg via INTRAVENOUS

## 2015-09-04 MED ORDER — SODIUM CHLORIDE 0.9 % IV SOLN
INTRAVENOUS | Status: DC | PRN
  Administered 2015-09-04: 17 mL

## 2015-09-04 MED ORDER — HYDROCHLOROTHIAZIDE 12.5 MG PO CAPS
12.5000 mg | ORAL_CAPSULE | Freq: Every day | ORAL | Status: DC
  Administered 2015-09-04 – 2015-09-05 (×2): 12.5 mg via ORAL
  Filled 2015-09-04 (×2): qty 1

## 2015-09-04 MED ORDER — ENOXAPARIN SODIUM 60 MG/0.6ML ~~LOC~~ SOLN
0.5000 mg/kg | SUBCUTANEOUS | Status: DC
  Administered 2015-09-04: 60 mg via SUBCUTANEOUS
  Filled 2015-09-04: qty 0.6

## 2015-09-04 MED ORDER — ROCURONIUM BROMIDE 100 MG/10ML IV SOLN
INTRAVENOUS | Status: DC | PRN
  Administered 2015-09-04: 50 mg via INTRAVENOUS

## 2015-09-04 MED ORDER — PROPOFOL 10 MG/ML IV BOLUS
INTRAVENOUS | Status: DC | PRN
  Administered 2015-09-04: 160 mg via INTRAVENOUS

## 2015-09-04 MED ORDER — FENTANYL CITRATE (PF) 250 MCG/5ML IJ SOLN
INTRAMUSCULAR | Status: AC
  Filled 2015-09-04: qty 5

## 2015-09-04 MED ORDER — BUPIVACAINE-EPINEPHRINE (PF) 0.25% -1:200000 IJ SOLN
INTRAMUSCULAR | Status: AC
  Filled 2015-09-04: qty 30

## 2015-09-04 MED ORDER — NEOSTIGMINE METHYLSULFATE 10 MG/10ML IV SOLN
INTRAVENOUS | Status: DC | PRN
  Administered 2015-09-04: 3 mg via INTRAVENOUS

## 2015-09-04 SURGICAL SUPPLY — 55 items
APL SKNCLS STERI-STRIP NONHPOA (GAUZE/BANDAGES/DRESSINGS) ×1
APPLIER CLIP 5 13 M/L LIGAMAX5 (MISCELLANEOUS) ×2
APR CLP MED LRG 5 ANG JAW (MISCELLANEOUS) ×1
BAG SPEC RTRVL 10 TROC 200 (ENDOMECHANICALS) ×1
BANDAGE ADH SHEER 1  50/CT (GAUZE/BANDAGES/DRESSINGS) ×6 IMPLANT
BENZOIN TINCTURE PRP APPL 2/3 (GAUZE/BANDAGES/DRESSINGS) ×2 IMPLANT
BLADE SURG ROTATE 9660 (MISCELLANEOUS) IMPLANT
CANISTER SUCTION 2500CC (MISCELLANEOUS) ×2 IMPLANT
CHLORAPREP W/TINT 26ML (MISCELLANEOUS) ×2 IMPLANT
CLIP APPLIE 5 13 M/L LIGAMAX5 (MISCELLANEOUS) ×1 IMPLANT
COVER MAYO STAND STRL (DRAPES) ×2 IMPLANT
COVER SURGICAL LIGHT HANDLE (MISCELLANEOUS) ×2 IMPLANT
DEVICE TROCAR PUNCTURE CLOSURE (ENDOMECHANICALS) ×1 IMPLANT
DRAPE C-ARM 42X72 X-RAY (DRAPES) ×2 IMPLANT
DRAPE PROXIMA HALF (DRAPES) ×2 IMPLANT
DRSG TEGADERM 4X4.75 (GAUZE/BANDAGES/DRESSINGS) ×2 IMPLANT
ELECT REM PT RETURN 9FT ADLT (ELECTROSURGICAL) ×2
ELECTRODE REM PT RTRN 9FT ADLT (ELECTROSURGICAL) ×1 IMPLANT
GAUZE SPONGE 2X2 8PLY STRL LF (GAUZE/BANDAGES/DRESSINGS) ×1 IMPLANT
GLOVE BIO SURGEON STRL SZ 6.5 (GLOVE) ×1 IMPLANT
GLOVE BIOGEL M STRL SZ7.5 (GLOVE) ×3 IMPLANT
GLOVE BIOGEL PI IND STRL 6.5 (GLOVE) IMPLANT
GLOVE BIOGEL PI IND STRL 7.5 (GLOVE) IMPLANT
GLOVE BIOGEL PI IND STRL 8 (GLOVE) ×1 IMPLANT
GLOVE BIOGEL PI IND STRL 8.5 (GLOVE) IMPLANT
GLOVE BIOGEL PI INDICATOR 6.5 (GLOVE) ×1
GLOVE BIOGEL PI INDICATOR 7.5 (GLOVE) ×1
GLOVE BIOGEL PI INDICATOR 8 (GLOVE) ×1
GLOVE BIOGEL PI INDICATOR 8.5 (GLOVE) ×1
GLOVE SURG SS PI 8.0 STRL IVOR (GLOVE) ×1 IMPLANT
GOWN STRL REUS W/ TWL LRG LVL3 (GOWN DISPOSABLE) ×3 IMPLANT
GOWN STRL REUS W/ TWL XL LVL3 (GOWN DISPOSABLE) ×1 IMPLANT
GOWN STRL REUS W/TWL LRG LVL3 (GOWN DISPOSABLE) ×2
GOWN STRL REUS W/TWL XL LVL3 (GOWN DISPOSABLE) ×4
KIT BASIN OR (CUSTOM PROCEDURE TRAY) ×2 IMPLANT
KIT ROOM TURNOVER OR (KITS) ×2 IMPLANT
NS IRRIG 1000ML POUR BTL (IV SOLUTION) ×2 IMPLANT
PAD ARMBOARD 7.5X6 YLW CONV (MISCELLANEOUS) ×2 IMPLANT
POUCH RETRIEVAL ECOSAC 10 (ENDOMECHANICALS) ×1 IMPLANT
POUCH RETRIEVAL ECOSAC 10MM (ENDOMECHANICALS) ×1
SCISSORS LAP 5X35 DISP (ENDOMECHANICALS) ×2 IMPLANT
SET CHOLANGIOGRAPH 5 50 .035 (SET/KITS/TRAYS/PACK) ×2 IMPLANT
SET IRRIG TUBING LAPAROSCOPIC (IRRIGATION / IRRIGATOR) ×2 IMPLANT
SLEEVE ENDOPATH XCEL 5M (ENDOMECHANICALS) ×4 IMPLANT
SPECIMEN JAR SMALL (MISCELLANEOUS) ×2 IMPLANT
SPONGE GAUZE 2X2 STER 10/PKG (GAUZE/BANDAGES/DRESSINGS) ×1
STRIP CLOSURE SKIN 1/2X4 (GAUZE/BANDAGES/DRESSINGS) ×2 IMPLANT
SUT MNCRL AB 4-0 PS2 18 (SUTURE) ×3 IMPLANT
SUT VICRYL 0 UR6 27IN ABS (SUTURE) ×2 IMPLANT
TOWEL OR 17X24 6PK STRL BLUE (TOWEL DISPOSABLE) ×2 IMPLANT
TOWEL OR 17X26 10 PK STRL BLUE (TOWEL DISPOSABLE) ×2 IMPLANT
TRAY LAPAROSCOPIC MC (CUSTOM PROCEDURE TRAY) ×2 IMPLANT
TROCAR XCEL BLUNT TIP 100MML (ENDOMECHANICALS) ×2 IMPLANT
TROCAR XCEL NON-BLD 5MMX100MML (ENDOMECHANICALS) ×2 IMPLANT
TUBING INSUFFLATION (TUBING) ×2 IMPLANT

## 2015-09-04 NOTE — Anesthesia Postprocedure Evaluation (Signed)
Anesthesia Post Note  Patient: Danielle Martin  Procedure(s) Performed: Procedure(s) (LRB): LAPAROSCOPIC CHOLECYSTECTOMY WITH INTRAOPERATIVE CHOLANGIOGRAM (N/A)  Patient location during evaluation: PACU Anesthesia Type: General Level of consciousness: awake and alert Pain management: pain level controlled Vital Signs Assessment: post-procedure vital signs reviewed and stable Respiratory status: spontaneous breathing, nonlabored ventilation, respiratory function stable and patient connected to nasal cannula oxygen Cardiovascular status: blood pressure returned to baseline and stable Postop Assessment: no signs of nausea or vomiting Anesthetic complications: no    Last Vitals:  Filed Vitals:   09/04/15 1630 09/04/15 1700  BP: 110/72 129/85  Pulse:    Temp:    Resp: 14     Last Pain:  Filed Vitals:   09/04/15 1720  PainSc: 6                  [name] DAVID

## 2015-09-04 NOTE — Anesthesia Postprocedure Evaluation (Signed)
Anesthesia Post Note  Patient: Danielle Martin  Procedure(s) Performed: Procedure(s) (LRB): LAPAROSCOPIC CHOLECYSTECTOMY WITH INTRAOPERATIVE CHOLANGIOGRAM (N/A)  Anesthesia Post Evaluation  Last Vitals:  Filed Vitals:   09/03/15 2346 09/04/15 0500  BP: 136/76 142/69  Pulse: 83 72  Temp:  36.8 C  Resp:  19    Last Pain:  Filed Vitals:   09/04/15 1548  PainSc: Asleep                 Jarvis Knodel

## 2015-09-04 NOTE — Op Note (Signed)
Danielle Martin [id number] Feb 12, 1961 09/04/2015  Laparoscopic Cholecystectomy with IOC Procedure Note  Indications: This patient presents with symptomatic gallbladder disease and will undergo laparoscopic cholecystectomy.  Pre-operative Diagnosis: gallstone pancreatitis  Post-operative Diagnosis: gallstone pancreatitis  Surgeon: Gayland Curry   Assistants: Madelaine Bhat, PA-S  Anesthesia: General endotracheal anesthesia  Procedure Details  The patient was seen again in the Holding Room. The risks, benefits, complications, treatment options, and expected outcomes were discussed with the patient. The possibilities of reaction to medication, pulmonary aspiration, perforation of viscus, bleeding, recurrent infection, finding a normal gallbladder, the need for additional procedures, failure to diagnose a condition, the possible need to convert to an open procedure, and creating a complication requiring transfusion or operation were discussed with the patient. The likelihood of improving the patient's symptoms with return to their baseline status is good.  The patient and/or family concurred with the proposed plan, giving informed consent. The site of surgery properly noted. The patient was taken to Operating Room, identified as Danielle Martin and the procedure verified as Laparoscopic Cholecystectomy with Intraoperative Cholangiogram. A Time Out was held and the above information confirmed. Antibiotic prophylaxis was administered.   Prior to the induction of general anesthesia, antibiotic prophylaxis was administered. General endotracheal anesthesia was then administered and tolerated well. After the induction, the abdomen was prepped with Chloraprep and draped in the sterile fashion. The patient was positioned in the supine position.  Local anesthetic agent was injected into the skin near the umbilicus and an incision made. We dissected down to the abdominal fascia with blunt dissection.  The fascia was  incised vertically and we entered the peritoneal cavity bluntly.  A pursestring suture of 0-Vicryl was placed around the fascial opening.  The Hasson cannula was inserted and secured with the stay suture.  Pneumoperitoneum was then created with CO2 and tolerated well without any adverse changes in the patient's vital signs. An 5-mm port was placed in the subxiphoid position.  Two 5-mm ports were placed in the right upper quadrant. All skin incisions were infiltrated with a local anesthetic agent before making the incision and placing the trocars.   We positioned the patient in reverse Trendelenburg, tilted slightly to the patient's left.  The gallbladder was identified, the fundus grasped and retracted cephalad. Adhesions were lysed bluntly and with the electrocautery where indicated, taking care not to injure any adjacent organs or viscus. The infundibulum was grasped and retracted laterally, exposing the peritoneum overlying the triangle of Calot. This was then divided and exposed in a blunt fashion. A critical view of the cystic duct and cystic artery was obtained.  The cystic duct was clearly identified and bluntly dissected circumferentially. The cystic duct was ligated with a clip distally.   An incision was made in the cystic duct and the [facility] cholangiogram catheter introduced. The catheter was secured using a clip. A cholangiogram was then obtained which showed good visualization of the distal and proximal biliary tree with no sign of filling defects or obstruction.  Contrast flowed easily into the duodenum. The catheter was then removed.   The cystic duct was then ligated with clips and divided. The cystic artery which had been identified & dissected free was ligated with clips and divided as well. A small posterior arterial branch was clipped and taken as well  The gallbladder was dissected from the liver bed in retrograde fashion with the electrocautery. The gallbladder was removed and placed in an  Ecco sac.  The gallbladder and Ecco sac  were then removed through the umbilical port site. The liver bed was irrigated and inspected. Hemostasis was achieved with the electrocautery. Copious irrigation was utilized and was repeatedly aspirated until clear.  The pursestring suture was used to close the umbilical fascia.    We again inspected the right upper quadrant for hemostasis.  The umbilical closure was inspected and there was an air leak and nothing trapped within the closure. 3 additional interrupted 0 vicryl sutures were placed using an endoclose device. There was no air leak.  Pneumoperitoneum was released as we removed the trocars.  4-0 Monocryl was used to close the skin.   Benzoin, steri-strips, and clean dressings were applied. The patient was then extubated and brought to the recovery room in stable condition. Instrument, sponge, and needle counts were correct at closure and at the conclusion of the case.   Findings: Cholelithiasis; +critical view; normal ioc  Estimated Blood Loss: Minimal         Drains: none         Specimens: Gallbladder           Complications: None; patient tolerated the procedure well.         Disposition: PACU - hemodynamically stable.         Condition: stable  Leighton Ruff. Redmond Pulling, MD, FACS General, Bariatric, & Minimally Invasive Surgery [facility], [place]

## 2015-09-04 NOTE — Anesthesia Preprocedure Evaluation (Signed)
Anesthesia Evaluation  Patient identified by MRN, date of birth, ID band Patient awake    Reviewed: Allergy & Precautions, NPO status , Patient's Chart, lab work & pertinent test results  Airway Mallampati: I  TM Distance: >3 FB Neck ROM: Full    Dental   Pulmonary former smoker,    Pulmonary exam normal        Cardiovascular hypertension, Pt. on medications Normal cardiovascular exam     Neuro/Psych    GI/Hepatic GERD  Medicated and Controlled,  Endo/Other    Renal/GU      Musculoskeletal   Abdominal   Peds  Hematology   Anesthesia Other Findings   Reproductive/Obstetrics                             Anesthesia Physical Anesthesia Plan  ASA: II  Anesthesia Plan: General   Post-op Pain Management:    Induction: Intravenous  Airway Management Planned: Oral ETT  Additional Equipment:   Intra-op Plan:   Post-operative Plan: Extubation in OR  Informed Consent: I have reviewed the patients History and Physical, chart, labs and discussed the procedure including the risks, benefits and alternatives for the proposed anesthesia with the patient or authorized representative who has indicated his/her understanding and acceptance.     Plan Discussed with: CRNA and Surgeon  Anesthesia Plan Comments:         Anesthesia Quick Evaluation  

## 2015-09-04 NOTE — Progress Notes (Signed)
Patient ID: Danielle Martin, female   DOB: 1960-11-22, 55 y.o.   MRN: [id number]    Subjective: Pt feels better today than yesterday.  No nausea  Objective: Vital signs in last 24 hours: Temp:  [98.1 F (36.7 C)-98.6 F (37 C)] 98.3 F (36.8 C) (01/17 0500) Pulse Rate:  [61-83] 72 (01/17 0500) Resp:  [12-19] 19 (01/17 0500) BP: (133-183)/(69-80) 142/69 mmHg (01/17 0500) SpO2:  [96 %-100 %] 100 % (01/17 0500) Last BM Date: 09/02/15  Intake/Output from previous day: 01/16 0701 - 01/17 0700 In: 2510 [I.V.:2410; IV Piggyback:100] Out: -  Intake/Output this shift:    PE: Abd: soft, still with some upper abdominal tenderness, but improved, +BS, obese Heart: regular Lungs: CTAB  Lab Results:   Recent Labs  09/03/15 0808 09/04/15 0056  WBC 6.6 5.9  HGB 12.4 12.7  HCT 38.3 39.3  PLT 353 361   BMET  Recent Labs  09/03/15 0808 09/04/15 0056  NA 139 142  K 3.7 3.6  CL 102 106  CO2 28 28  GLUCOSE 104* 129*  BUN 20 15  CREATININE 0.68 0.67  CALCIUM 8.3* 8.2*   PT/INR No results for input(s): LABPROT, INR in the last 72 hours. CMP     Component Value Date/Time   NA 142 09/04/2015 0056   K 3.6 09/04/2015 0056   CL 106 09/04/2015 0056   CO2 28 09/04/2015 0056   GLUCOSE 129* 09/04/2015 0056   BUN 15 09/04/2015 0056   CREATININE 0.67 09/04/2015 0056   CALCIUM 8.2* 09/04/2015 0056   CALCIUM 8.7 02/24/2007 1630   PROT 6.5 09/04/2015 0056   ALBUMIN 3.4* 09/04/2015 0056   AST 168* 09/04/2015 0056   ALT 220* 09/04/2015 0056   ALKPHOS 77 09/04/2015 0056   BILITOT 1.1 09/04/2015 0056   GFRNONAA >60 09/04/2015 0056   GFRAA >60 09/04/2015 0056   Lipase     Component Value Date/Time   LIPASE 47 09/04/2015 0056       Studies/Results: US Abdomen Complete  09/03/2015  CLINICAL DATA:  56 year old female with right upper quadrant abdominal pain and increased liver function tests and lipase. EXAM: ABDOMEN ULTRASOUND COMPLETE COMPARISON:  None. FINDINGS: Gallbladder:  There multiple stones within the gallbladder. No gallbladder wall thickening or pericholecystic fluid. Negative sonographic Murphy's sign. Common bile duct: Diameter: 6 mm Liver: No focal lesion identified. Within normal limits in parenchymal echogenicity. IVC: No abnormality visualized. Pancreas: Visualized portion unremarkable. Spleen: Size and appearance within normal limits. Right Kidney: Length: 13 cm. Echogenicity within normal limits. No mass or hydronephrosis visualized. Left Kidney: Length: 13 cm. Echogenicity within normal limits. No mass or hydronephrosis visualized. Abdominal aorta: No aneurysm visualized. Other findings: None. IMPRESSION: Cholelithiasis without sonographic evidence of acute cholecystitis. No other sonographic abnormalities identified. Electronically Signed   By: Anner Crete M.D.   On: 09/03/2015 04:11    Anti-infectives: Anti-infectives    Start     Dose/Rate Route Frequency Ordered Stop   09/03/15 1600  piperacillin-tazobactam (ZOSYN) IVPB 3.375 g     3.375 g 12.5 mL/hr over 240 Minutes Intravenous 3 times per day 09/03/15 0744     09/03/15 0745  piperacillin-tazobactam (ZOSYN) IVPB 3.375 g     3.375 g 100 mL/hr over 30 Minutes Intravenous  Once 09/03/15 0742 09/03/15 0834       Assessment/Plan  1. Gallstone pancreatitis -LFTs are trending down today, will plan for lap chole with IOC today -NPO -cont abx therapy -procedure discussed in detail with patient by  Dr. Redmond Pulling.  Patient agreeable to proceed.   LOS: 1 day    Ashtian Villacis E 09/04/2015, 10:03 AM Pager: [phone]

## 2015-09-04 NOTE — Consult Note (Signed)
Referring Provider: Dr. Grandville Silos Primary Care Physician:  Lottie Dawson, MD Primary Gastroenterologist:  Dr. Penelope Coop  Reason for Consultation:  Gallstone Pancreatitis  HPI: RENESMAY ATTARD is a 55 y.o. female who had the onset of upper quadrant pain (worse in epigastric area) starting this past Friday with chills over the weekend. The pain worsened significantly on Sunday night (09/02/15) and was sharp constant and radiated into her back. Pain Sunday night occurred within an hour of eating. +N without vomiting. +chills without fevers. Denies chest pain, shortness of breath. Chronic heartburn controlled with diet and Prilosec prn. Denies any similar abdominal pains prior to this past Friday. Occasional alcohol. U/S shows gallstones without acute cholecystitis and no CBD dilation. Lipase 155, TB 1.7, ALP 89, AST 352, ALT 253 on 09/03/15.    Past Medical History  Diagnosis Date  . Asthma     Worse in winter  . Allergic rhinitis     Worse in winter , grasses  . SVT (supraventricular tachycardia) (HCC)     No longer a problem  . HEMORRHAGIC CYSTITIS 05/25/2009    Qualifier: Diagnosis of  By: Regis Bill MD, Standley Brooking   . Iron deficiency anemia 05/01/2015  . Hypertension   . Pneumonia     "a few times" (09/03/2015)  . GERD (gastroesophageal reflux disease)   . [id number])     "bi-weekly" (09/03/2015)  . Migraine     "q couple months" (09/03/2015)    Past Surgical History  Procedure Laterality Date  . Cesarean section  1990; 1991  . Oophorectomy  1980s    "all of the right; part of the left"  . Anterior cervical decomp/discectomy fusion  2008  . Back surgery    . Vaginal hysterectomy  2010    Prior to Admission medications   Medication Sig Start Date End Date Taking? Authorizing Provider  ADVAIR DISKUS 100-50 MCG/DOSE AEPB USE ONE INHALATION INTO THE LUNGS TWO TIMES A DAY OR AS DIRECTED Patient taking differently: USE ONE INHALATION INTO THE LUNGS TWO TIMES A DAY as needed   Yes Burnis Medin, MD  albuterol (PROVENTIL HFA;VENTOLIN HFA) 108 (90 BASE) MCG/ACT inhaler Inhale 2 puffs into the lungs every 6 (six) hours as needed for wheezing. 10/26/13  Yes Burnis Medin, MD  albuterol (PROVENTIL) (2.5 MG/3ML) 0.083% nebulizer solution Take 3 mLs (2.5 mg total) by nebulization every 6 (six) hours as needed for wheezing or shortness of breath. 11/25/13  Yes Doe-Hyun R Shawna Orleans, DO  Calcium Carbonate-Vitamin D 600-400 MG-UNIT per chew tablet Chew 1 tablet by mouth daily.   Yes Historical Provider, MD  CINNAMON PO Take 1,000 Units by mouth every morning.   Yes Historical Provider, MD  losartan-hydrochlorothiazide (HYZAAR) 50-12.5 MG tablet Take 1 tablet by mouth daily. 06/08/15  Yes Burnis Medin, MD  metoprolol succinate (TOPROL-XL) 50 MG 24 hr tablet Take 1 tablet (50 mg total) by mouth daily. Take with or immediately following a meal. Patient taking differently: Take 50 mg by mouth every evening. Take with or immediately following a meal. 11/20/14  Yes Burnis Medin, MD  Multiple Vitamin (MULTI-VITAMIN PO) Take 1 tablet by mouth daily.    Yes Historical Provider, MD  naproxen sodium (ANAPROX) 220 MG tablet Take 660 mg by mouth 2 (two) times daily as needed (for pain).    Yes Historical Provider, MD  vitamin E 400 UNIT capsule Take 400 Units by mouth daily.   Yes Historical Provider, MD  fluticasone (FLONASE) 50 MCG/ACT nasal spray  Place 2 sprays into both nostrils daily. 2 each nostril 10/26/13 10/26/14  Burnis Medin, MD    Scheduled Meds: . metoprolol  2.5 mg Intravenous 4 times per day  . piperacillin-tazobactam (ZOSYN)  IV  3.375 g Intravenous 3 times per day   Continuous Infusions: . dextrose 5 % and 0.9% NaCl 150 mL/hr at 09/03/15 2346   PRN Meds:.acetaminophen **OR** acetaminophen, hydrALAZINE, HYDROmorphone (DILAUDID) injection, ondansetron **OR** ondansetron (ZOFRAN) IV  Allergies as of 09/03/2015 - Review Complete 09/03/2015  Allergen Reaction Noted  . Codeine Anaphylaxis  12/25/2010  . Ace inhibitors Cough 01/16/2015    Family History  Problem Relation Age of Onset  . Prostate cancer Father   . Other Mother     Died of asphyxiation    Social History   Social History  . Marital Status: Married    Spouse Name: N/A  . Number of Children: N/A  . Years of Education: N/A   Occupational History  . Not on file.   Social History Main Topics  . Smoking status: Former Smoker -- 0.12 packs/day for 3 years    Types: Cigarettes  . Smokeless tobacco: Never Used     Comment: "stopped smoking in the 1980s"  . Alcohol Use: 4.8 oz/week    4 Glasses of wine, 4 Shots of liquor per week  . Drug Use: No  . Sexual Activity: Yes   Other Topics Concern  . Not on file   Social History Narrative   Married   Employed [employer]  40 hours a week   HH of 2    Husband has a Economist   No ets   Drinks milk [employer] women[employer]   Soc etoh only rare no tob ets    Review of Systems: All negative except as stated above in HPI.  Physical Exam: Vital signs: Filed Vitals:   09/03/15 2346 09/04/15 0500  BP: 136/76 142/69  Pulse: 83 72  Temp:  98.3 F (36.8 C)  Resp:  19   Last BM Date: 09/02/15 General:   Well-developed, well-nourished, pleasant and cooperative in NAD Head: atraumatic Eyes: anicteric sclera ENT: oropharynx clear Neck: supple, nontender Lungs:  Clear throughout to auscultation.   No wheezes, crackles, or rhonchi. No acute distress. Heart:  Regular rate and rhythm; no murmurs, clicks, rubs,  or gallops. Abdomen: epigastric, RUQ, and LUQ tenderness with guarding (greatest in epigastric region), soft, nondistended, +BS  Rectal:  Deferred Ext: pedal pulses intact, no edema  GI:  Lab Results:  Recent Labs  09/03/15 0054 09/03/15 0808 09/04/15 0056  WBC 11.5* 6.6 5.9  HGB 13.0 12.4 12.7  HCT 40.4 38.3 39.3  PLT 441* 353 361   BMET  Recent Labs  09/03/15 0054 09/03/15 0808 09/04/15 0056  NA 140 139 142  K 3.9 3.7  3.6  CL 99* 102 106  CO2 29 28 28   GLUCOSE 130* 104* 129*  BUN 25* 20 15  CREATININE 0.80 0.68 0.67  CALCIUM 9.1 8.3* 8.2*   LFT  Recent Labs  09/03/15 0808 09/04/15 0056  PROT 6.6 6.5  ALBUMIN 3.5 3.4*  AST 352* 168*  ALT 253* 220*  ALKPHOS 89 77  BILITOT 1.7* 1.1  BILIDIR 0.7*  --   IBILI 1.0*  --    PT/INR No results for input(s): LABPROT, INR in the last 72 hours.   Studies/Results: US Abdomen Complete  09/03/2015  CLINICAL DATA:  55 year old female with right upper quadrant abdominal  pain and increased liver function tests and lipase. EXAM: ABDOMEN ULTRASOUND COMPLETE COMPARISON:  None. FINDINGS: Gallbladder: There multiple stones within the gallbladder. No gallbladder wall thickening or pericholecystic fluid. Negative sonographic Murphy's sign. Common bile duct: Diameter: 6 mm Liver: No focal lesion identified. Within normal limits in parenchymal echogenicity. IVC: No abnormality visualized. Pancreas: Visualized portion unremarkable. Spleen: Size and appearance within normal limits. Right Kidney: Length: 13 cm. Echogenicity within normal limits. No mass or hydronephrosis visualized. Left Kidney: Length: 13 cm. Echogenicity within normal limits. No mass or hydronephrosis visualized. Abdominal aorta: No aneurysm visualized. Other findings: None. IMPRESSION: Cholelithiasis without sonographic evidence of acute cholecystitis. No other sonographic abnormalities identified. Electronically Signed   By: Anner Crete M.D.   On: 09/03/2015 04:11    Impression/Plan: 55 yo with gallstone pancreatitis who likely passed a gallstone. Doubt choledocholithiasis. LFTs are improving and lipase has normalized. Clinically, her pancreatitis is improving. Agree with surgery that she needs a lap chole prior to discharge. Keep NPO. No role for preop ERCP. Defer diet changes to surgery pending their decision on timing of surgery. Call us back if IOC is positive indicating need for postop ERCP.  Continue supportive care.   LOS: 1 day   [place] C.  09/04/2015, 9:00 AM  Pager [phone]  If no answer or after 5 PM call [phone]

## 2015-09-04 NOTE — Progress Notes (Signed)
[facility] PROGRESS NOTE  Danielle Martin [id number] DOB: 05/29/1961 DOA: 09/03/2015 PCP: Lottie Dawson, MD  Assessment/Plan: #1 gallstone pancreatitis Patient noted to have gallstones by abdominal ultrasound. Patient had presented with elevated lipase and transaminitis. Patient with clinical improvement this morning. Lipase levels trending down. LFTs trending down. Patient has been seen in consultation by GI who is in agreement that patient will likely need a laparoscopic cholecystectomy. Patient currently nothing by mouth. Per GI no role for preop ERCP at this time. Patient has been seen by general surgery and patient scheduled for laparoscopic cholecystectomy today. Continue IV fluids, hydration, empiric IV antibiotics. General surgery following and appreciate input and recommendations.  #2 transaminitis Likely secondary to problem #1. LFTs trending down. See #1.  #3 history of hypertension SVT Continue IV metoprolol.  #4 history of asthma Stable.  #5 prophylaxis SCDs for DVT prophylaxis.  Code Status: Full Family Communication: updated patient and daughter at bedside. Disposition Plan: Home when medically stable.   Consultants:  Gastroenterology: Dr Michail Sermon 09/04/2015  CCS: Dr Kieth Brightly 09/03/2015  Procedures:  Abd [place] 09/03/2015  Antibiotics:  IV Zosyn 09/03/2015  HPI/Subjective: Patient states abdominal pain improved. No nausea, no emesis. Patient thirsty.  Objective: Filed Vitals:   09/03/15 2346 09/04/15 0500  BP: 136/76 142/69  Pulse: 83 72  Temp:  98.3 F (36.8 C)  Resp:  19    Intake/Output Summary (Last 24 hours) at 09/04/15 1202 Last data filed at 09/04/15 0900  Gross per 24 hour  Intake   2510 ml  Output      0 ml  Net   2510 ml   There were no vitals filed for this visit.  Exam:   General:  NAD  Cardiovascular: RRR  Respiratory: CTAB  Abdomen: Soft/Decreased TTP in epigastrium and RUQ/+BS   Musculoskeletal: No  c/c/e  Data Reviewed: Basic Metabolic Panel:  Recent Labs Lab 09/03/15 0054 09/03/15 0808 09/04/15 0056  NA 140 139 142  K 3.9 3.7 3.6  CL 99* 102 106  CO2 29 28 28   GLUCOSE 130* 104* 129*  BUN 25* 20 15  CREATININE 0.80 0.68 0.67  CALCIUM 9.1 8.3* 8.2*  MG  --   --  1.9   Liver Function Tests:  Recent Labs Lab 09/03/15 0054 09/03/15 0808 09/04/15 0056  AST 147* 352* 168*  ALT 110* 253* 220*  ALKPHOS 82 89 77  BILITOT 1.5* 1.7* 1.1  PROT 7.5 6.6 6.5  ALBUMIN 4.0 3.5 3.4*    Recent Labs Lab 09/03/15 0054 09/04/15 0056  LIPASE 155* 47   No results for input(s): AMMONIA in the last 168 hours. CBC:  Recent Labs Lab 08/31/15 0941 09/03/15 0054 09/03/15 0808 09/04/15 0056  WBC 7.0 11.5* 6.6 5.9  NEUTROABS 4.1  --  4.2  --   HGB 13.4 13.0 12.4 12.7  HCT 42.6 40.4 38.3 39.3  MCV 88 85.6 86.7 85.4  PLT 435* 441* 353 361   Cardiac Enzymes: No results for input(s): CKTOTAL, CKMB, CKMBINDEX, TROPONINI in the last 168 hours. BNP (last 3 results) No results for input(s): BNP in the last 8760 hours.  ProBNP (last 3 results) No results for input(s): PROBNP in the last 8760 hours.  CBG:  Recent Labs Lab 09/03/15 1318 09/03/15 2355 09/04/15 0546  GLUCAP 63* 115* 119*    No results found for this or any previous visit (from the past 240 hour(s)).   Studies: US Abdomen Complete  09/03/2015  CLINICAL DATA:  55 year old female with right  upper quadrant abdominal pain and increased liver function tests and lipase. EXAM: ABDOMEN ULTRASOUND COMPLETE COMPARISON:  None. FINDINGS: Gallbladder: There multiple stones within the gallbladder. No gallbladder wall thickening or pericholecystic fluid. Negative sonographic Murphy's sign. Common bile duct: Diameter: 6 mm Liver: No focal lesion identified. Within normal limits in parenchymal echogenicity. IVC: No abnormality visualized. Pancreas: Visualized portion unremarkable. Spleen: Size and appearance within normal  limits. Right Kidney: Length: 13 cm. Echogenicity within normal limits. No mass or hydronephrosis visualized. Left Kidney: Length: 13 cm. Echogenicity within normal limits. No mass or hydronephrosis visualized. Abdominal aorta: No aneurysm visualized. Other findings: None. IMPRESSION: Cholelithiasis without sonographic evidence of acute cholecystitis. No other sonographic abnormalities identified. Electronically Signed   By: Anner Crete M.D.   On: 09/03/2015 04:11    Scheduled Meds: . metoprolol  2.5 mg Intravenous 4 times per day  . piperacillin-tazobactam (ZOSYN)  IV  3.375 g Intravenous 3 times per day   Continuous Infusions: . dextrose 5 % and 0.9% NaCl 150 mL/hr at 09/03/15 2346    Principal Problem:   Gallstone pancreatitis Active Problems:   Allergic rhinitis   SVT (supraventricular tachycardia) (HCC)   Hypertension   Iron deficiency anemia    Time spent: 20 minutes    Skylen Danielsen M.D. [facility] Pager [phone]. If 7PM-7AM, please contact night-coverage at [email or web address], password [facility] 09/04/2015, 12:02 PM  LOS: 1 day

## 2015-09-04 NOTE — Progress Notes (Addendum)
Comments:  Notified by Dr Collene Mares w/ GI service at approx 2000 that she was consulted by hospitalists for this pt. She reports this py has been seen in past by [facility] (referred by PCP to Dr Penelope Coop for colonoscopy for hem + stools 03/06/2011) and she feels [facility] should be notified to see pt. Reviewed record. Note for colonoscopy noted but does not appear to be any office f/u. Spoke w/ Dr Paulita Fujita w/ [facility] who agreed to discuss this w/ Dr Collene Mares in am as there is no need for urgent evaluation tonight.   Jeryl Columbia, NP-C [facility] Pager [phone]

## 2015-09-04 NOTE — Transfer of Care (Signed)
Immediate Anesthesia Transfer of Care Note  Patient: Danielle Martin  Procedure(s) Performed: Procedure(s): LAPAROSCOPIC CHOLECYSTECTOMY WITH INTRAOPERATIVE CHOLANGIOGRAM (N/A)  Patient Location: PACU  Anesthesia Type:General  Level of Consciousness: awake, alert  and oriented  Airway & Oxygen Therapy: Patient Spontanous Breathing and Patient connected to nasal cannula oxygen  Post-op Assessment: Report given to RN and Post -op Vital signs reviewed and stable  Post vital signs: Reviewed and stable  Last Vitals:  Filed Vitals:   09/03/15 2346 09/04/15 0500  BP: 136/76 142/69  Pulse: 83 72  Temp:  36.8 C  Resp:  19    Complications: No apparent anesthesia complications

## 2015-09-04 NOTE — Care Management Note (Signed)
Case Management Note  Patient Details  Name: Danielle Martin MRN: [id number] Date of Birth: [birth year]  Subjective/Objective:                    Action/Plan:   Expected Discharge Date:                  Expected Discharge Plan:  Home/Self Care  In-House Referral:     Discharge planning Services     Post Acute Care Choice:    Choice offered to:     DME Arranged:    DME Agency:     HH Arranged:    HH Agency:     Status of Service:  In process, will continue to follow  Medicare Important Message Given:    Date Medicare IM Given:    Medicare IM give by:    Date Additional Medicare IM Given:    Additional Medicare Important Message give by:     If discussed at [place] of Stay Meetings, dates discussed:    Additional Comments:  Marilu Favre, RN 09/04/2015, 11:39 AM

## 2015-09-04 NOTE — Anesthesia Procedure Notes (Signed)
Procedure Name: Intubation Date/Time: 09/04/2015 2:21 PM Performed by: Clearnce Sorrel Pre-anesthesia Checklist: Patient identified, Timeout performed, Emergency Drugs available, Suction available and Patient being monitored Patient Re-evaluated:Patient Re-evaluated prior to inductionOxygen Delivery Method: Circle system utilized Preoxygenation: Pre-oxygenation with 100% oxygen Intubation Type: IV induction Ventilation: Mask ventilation without difficulty and Two handed mask ventilation required Laryngoscope Size: Mac and 3 Grade View: Grade I Tube size: 7.0 mm Number of attempts: 1 Airway Equipment and Method: Stylet Placement Confirmation: ETT inserted through vocal cords under direct vision,  breath sounds checked- equal and bilateral and positive ETCO2 Secured at: 22 cm Tube secured with: Tape Dental Injury: Teeth and Oropharynx as per pre-operative assessment

## 2015-09-05 ENCOUNTER — Encounter (HOSPITAL_COMMUNITY): Payer: Self-pay | Admitting: General Surgery

## 2015-09-05 ENCOUNTER — Encounter: Payer: Self-pay | Admitting: General Surgery

## 2015-09-05 DIAGNOSIS — D509 Iron deficiency anemia, unspecified: Secondary | ICD-10-CM

## 2015-09-05 DIAGNOSIS — I471 Supraventricular tachycardia: Secondary | ICD-10-CM

## 2015-09-05 DIAGNOSIS — K851 Biliary acute pancreatitis without necrosis or infection: Principal | ICD-10-CM

## 2015-09-05 DIAGNOSIS — I10 Essential (primary) hypertension: Secondary | ICD-10-CM

## 2015-09-05 LAB — CBC WITH DIFFERENTIAL/PLATELET
Basophils Absolute: 0 10*3/uL (ref 0.0–0.1)
Basophils Relative: 1 %
Eosinophils Absolute: 0.3 10*3/uL (ref 0.0–0.7)
Eosinophils Relative: 4 %
HEMATOCRIT: 38.1 % (ref 36.0–46.0)
Hemoglobin: 11.7 g/dL — ABNORMAL LOW (ref 12.0–15.0)
LYMPHS PCT: 20 %
Lymphs Abs: 1.7 10*3/uL (ref 0.7–4.0)
MCH: 27.5 pg (ref 26.0–34.0)
MCHC: 30.7 g/dL (ref 30.0–36.0)
MCV: 89.6 fL (ref 78.0–100.0)
MONO ABS: 0.6 10*3/uL (ref 0.1–1.0)
MONOS PCT: 7 %
NEUTROS ABS: 5.6 10*3/uL (ref 1.7–7.7)
Neutrophils Relative %: 68 %
Platelets: 358 10*3/uL (ref 150–400)
RBC: 4.25 MIL/uL (ref 3.87–5.11)
RDW: 14 % (ref 11.5–15.5)
WBC: 8.2 10*3/uL (ref 4.0–10.5)

## 2015-09-05 LAB — GLUCOSE, CAPILLARY
GLUCOSE-CAPILLARY: 100 mg/dL — AB (ref 65–99)
Glucose-Capillary: 120 mg/dL — ABNORMAL HIGH (ref 65–99)
Glucose-Capillary: 84 mg/dL (ref 65–99)
Glucose-Capillary: 98 mg/dL (ref 65–99)

## 2015-09-05 LAB — COMPREHENSIVE METABOLIC PANEL
ALBUMIN: 3.2 g/dL — AB (ref 3.5–5.0)
ALK PHOS: 67 U/L (ref 38–126)
ALT: 158 U/L — ABNORMAL HIGH (ref 14–54)
ANION GAP: 8 (ref 5–15)
AST: 83 U/L — AB (ref 15–41)
BILIRUBIN TOTAL: 0.4 mg/dL (ref 0.3–1.2)
BUN: 11 mg/dL (ref 6–20)
CO2: 29 mmol/L (ref 22–32)
Calcium: 8.1 mg/dL — ABNORMAL LOW (ref 8.9–10.3)
Chloride: 103 mmol/L (ref 101–111)
Creatinine, Ser: 0.72 mg/dL (ref 0.44–1.00)
GFR calc Af Amer: >60 mL/min (ref >60)
GLUCOSE: 106 mg/dL — AB (ref 65–99)
POTASSIUM: 3.6 mmol/L (ref 3.5–5.1)
Sodium: 140 mmol/L (ref 135–145)
Total Protein: 6.5 g/dL (ref 6.5–8.1)

## 2015-09-05 LAB — LIPASE, BLOOD: Lipase: 31 U/L (ref 11–51)

## 2015-09-05 MED ORDER — POLYETHYLENE GLYCOL 3350 17 G PO PACK: 17.0000 g | PACK | Freq: Every day | ORAL | Status: DC

## 2015-09-05 MED ORDER — IBUPROFEN 600 MG PO TABS: 600.0000 mg | ORAL_TABLET | Freq: Four times a day (QID) | ORAL | Status: DC | PRN

## 2015-09-05 MED ORDER — OXYCODONE-ACETAMINOPHEN 5-325 MG PO TABS: 1.0000 | ORAL_TABLET | ORAL | Status: DC | PRN

## 2015-09-05 NOTE — Progress Notes (Signed)
1 Day Post-Op  Subjective: She is happy to eat, sore, but over all doing well.  Sites all look good.       Objective: Vital signs in last 24 hours: Temp:  [97.7 F (36.5 C)-98.3 F (36.8 C)] 98.3 F (36.8 C) (01/18 0459) Pulse Rate:  [61-77] 77 (01/18 0459) Resp:  [12-17] 15 (01/18 0459) BP: (110-142)/(64-85) 117/64 mmHg (01/18 0459) SpO2:  [94 %-100 %] 97 % (01/18 0459) Weight:  [118.9 kg (262 lb 2 oz)] 118.9 kg (262 lb 2 oz) (01/18 0000) Last BM Date: 09/02/15 PO 840 Urine volume  not recorded, voided x 4 Afebrile, VSS Labs show WBC is normal, LFT's improving, lipase is normal IOC:  Negative for retained common duct stone.  Intake/Output from previous day: 01/17 0701 - 01/18 0700 In: 2846.7 [P.O.:840; I.V.:2006.7] Out: -  Intake/Output this shift:    General appearance: alert, cooperative and no distress GI: soft, sore, sites all look good.  Lab Results:   Recent Labs  09/04/15 0056 09/05/15 0537  WBC 5.9 8.2  HGB 12.7 11.7*  HCT 39.3 38.1  PLT 361 358    BMET  Recent Labs  09/04/15 0056 09/05/15 0537  NA 142 140  K 3.6 3.6  CL 106 103  CO2 28 29  GLUCOSE 129* 106*  BUN 15 11  CREATININE 0.67 0.72  CALCIUM 8.2* 8.1*   PT/INR No results for input(s): LABPROT, INR in the last 72 hours.   Recent Labs Lab 09/03/15 0054 09/03/15 0808 09/04/15 0056 09/05/15 0537  AST 147* 352* 168* 83*  ALT 110* 253* 220* 158*  ALKPHOS 82 89 77 67  BILITOT 1.5* 1.7* 1.1 0.4  PROT 7.5 6.6 6.5 6.5  ALBUMIN 4.0 3.5 3.4* 3.2*     Lipase     Component Value Date/Time   LIPASE 31 09/05/2015 0537     Studies/Results: Dg Cholangiogram Operative  09/04/2015  CLINICAL DATA:  Laparoscopic cholecystectomy for cholelithiasis EXAM: INTRAOPERATIVE CHOLANGIOGRAM TECHNIQUE: Cholangiographic images from the C-arm fluoroscopic device were submitted for interpretation post-operatively. Please see the procedural report for the amount of contrast and the fluoroscopy time  utilized. COMPARISON:  Ultrasound 09/03/2015 FINDINGS: No persistent filling defects in the common duct. Intrahepatic ducts are incompletely visualized, appearing decompressed centrally. There some contrast reflux into the nondilated pancreatic duct. Contrast passes into the duodenum. : Negative for retained common duct stone. Electronically Signed   By: Lucrezia Europe M.D.   On: 09/04/2015 15:14    Medications: . docusate sodium  100 mg Oral BID  . enoxaparin (LOVENOX) injection  0.5 mg/kg Subcutaneous Q24H  . hydrochlorothiazide  12.5 mg Oral Daily  . losartan  50 mg Oral Daily  . metoprolol succinate  50 mg Oral QPM  . mometasone-formoterol  2 puff Inhalation BID  . polyethylene glycol  17 g Oral Daily    Assessment/Plan Gallstone pancreatitis S/p Laparoscopic Cholecystectomy with IOC , 09/04/15, Dr. Greer Pickerel Hx of SVT Asthma Hx of Hemorrhagic Cystitis  Antibiotics:  Pre op Zosyn DVT:  Lovenox/SCD  Plan:  I am going to let her eat, get up and walk,  See how she does with PO pain meds and she should be able to go home later today.  She can use Aleve, tylenol or Percocet for pain.  I have follow up appointment and discharge instructions in the AVS.   OK to go home from our standpoint now.  2:23 PM.    LOS: 2 days    Danielle Martin 09/05/2015

## 2015-09-05 NOTE — Progress Notes (Signed)
Patient given discharge information, all questions answered. Pain medication-Percocet given to patient. Patient assisted to car via wheelchair with nurse tech assisting.[place]

## 2015-09-05 NOTE — Discharge Summary (Signed)
Physician Discharge Summary  Danielle Martin [id number] DOB: Aug 23, 1960 DOA: 09/03/2015  PCP: Lottie Dawson, MD  Admit date: 09/03/2015 Discharge date: 09/05/2015  Time spent: *40* minutes  Recommendations for Outpatient Follow-up:  1. Follow-up with primary care physician within one week. 2. Follow-up with gen in 2 weeks as outpatient.    Discharge Diagnoses:  Principal Problem:   Gallstone pancreatitis Active Problems:   Allergic rhinitis   SVT (supraventricular tachycardia) (HCC)   Hypertension   Iron deficiency anemia   Discharge Condition: Stable  Diet recommendation: Heart healthy  Filed Weights   09/05/15 0000  Weight: 118.9 kg (262 lb 2 oz)    History of present illness:  Danielle Martin is a 55 y.o. female with history of hypertension, SVT and asthma presents to the ER because of abdominal pain. Patient has been experiencing abdominal pain mostly in the upper quadrants since Friday, last 3 days. Has been having some nausea and denies any vomiting or diarrhea. Denies any fever or chills. In the ER patient's labs revealed mildly elevated LFTs with lipase. Sonogram of the abdomen shows gallstones. On exam patient has tenderness in epigastric and upper quadrants. Patient has been admitted for gallstone pancreatitis. Patient drinks alcohol only occasionally. Denies any chest pain or shortness of breath.   Hospital Course:   Gallstone pancreatitis Patient had presented with elevated lipase and transaminitis. Patient noted to have gallstones by abdominal ultrasound.  Lipase and transaminases improved, appears to be okay from pancreatitis standpoint.  Nothing by mouth initially, Per GI no role for preop ERCP at this time.  Undergone laparoscopic cholecystectomy on 09/04/2015. Seen earlier today by general surgery and recommended discharge. Tolerating diet okay, discharged on Percocet 30 tablets were pain control.  Cholelithiasis Underwent laparoscopic cholecystectomy  done by Dr. Redmond Pulling on 09/04/2015. She developed gallstone pancreatitis, indicated the cholecystectomy. per IOC and ultrasound no choledocholithiasis.  Transaminitis Likely secondary to  gallstone pancreatitis.  History of hypertension SVT Continue IV metoprolol.  History of asthma Stable.   Procedures: laparoscopic cholecystectomy done by Dr. Redmond Pulling on 09/04/2015.  Consultations:  Gen surgery  Discharge Exam: Filed Vitals:   09/04/15 2149 09/05/15 0459  BP: 127/72 117/64  Pulse: 65 77  Temp: 97.9 F (36.6 C) 98.3 F (36.8 C)  Resp: 15 15   General: Alert and awake, oriented x3, not in any acute distress. HEENT: anicteric sclera, pupils reactive to light and accommodation, EOMI CVS: S1-S2 clear, no murmur rubs or gallops Chest: clear to auscultation bilaterally, no wheezing, rales or rhonchi Abdomen: soft nontender, nondistended, normal bowel sounds, no organomegaly Extremities: no cyanosis, clubbing or edema noted bilaterally Neuro: Cranial nerves II-XII intact, no focal neurological deficits  Discharge Instructions   Discharge Instructions    Diet - low sodium heart healthy    Complete by:  As directed      Increase activity slowly    Complete by:  As directed           Current Discharge Medication List    START taking these medications   Details  oxyCODONE-acetaminophen (PERCOCET/ROXICET) 5-325 MG tablet Take 1-2 tablets by mouth every 4 (four) hours as needed for moderate pain or severe pain. Qty: 30 tablet, Refills: 0    polyethylene glycol (MIRALAX / GLYCOLAX) packet Take 17 g by mouth daily. Qty: 14 each, Refills: 0      CONTINUE these medications which have NOT CHANGED   Details  ADVAIR DISKUS 100-50 MCG/DOSE AEPB USE ONE INHALATION INTO THE LUNGS TWO TIMES  A DAY OR AS DIRECTED Qty: 3 each, Refills: 1    albuterol (PROVENTIL HFA;VENTOLIN HFA) 108 (90 BASE) MCG/ACT inhaler Inhale 2 puffs into the lungs every 6 (six) hours as needed for wheezing.     albuterol (PROVENTIL) (2.5 MG/3ML) 0.083% nebulizer solution Take 3 mLs (2.5 mg total) by nebulization every 6 (six) hours as needed for wheezing or shortness of breath. Qty: 150 mL, Refills: 1    Calcium Carbonate-Vitamin D 600-400 MG-UNIT per chew tablet Chew 1 tablet by mouth daily.    CINNAMON PO Take 1,000 Units by mouth every morning.    losartan-hydrochlorothiazide (HYZAAR) 50-12.5 MG tablet Take 1 tablet by mouth daily. Qty: 90 tablet, Refills: 2    metoprolol succinate (TOPROL-XL) 50 MG 24 hr tablet Take 1 tablet (50 mg total) by mouth daily. Take with or immediately following a meal. Qty: 90 tablet, Refills: 3    Multiple Vitamin (MULTI-VITAMIN PO) Take 1 tablet by mouth daily.     naproxen sodium (ANAPROX) 220 MG tablet Take 660 mg by mouth 2 (two) times daily as needed (for pain).     vitamin E 400 UNIT capsule Take 400 Units by mouth daily.    fluticasone (FLONASE) 50 MCG/ACT nasal spray Place 2 sprays into both nostrils daily. 2 each nostril Qty: 48 g, Refills: 3       Allergies  Allergen Reactions  . Codeine Anaphylaxis    Hallucinations  . Ace Inhibitors Cough   Follow-up Information    Follow up with [facility] On 09/25/2015.   Specialty:  General Surgery   Why:  Your appointment is at 1:45 PM, be at the office 30 minutes early for check in.   Contact information:   [place] [address] [phone]        The results of significant diagnostics from this hospitalization (including imaging, microbiology, ancillary and laboratory) are listed below for reference.    Significant Diagnostic Studies: Dg Cholangiogram Operative  09/04/2015  CLINICAL DATA:  Laparoscopic cholecystectomy for cholelithiasis EXAM: INTRAOPERATIVE CHOLANGIOGRAM TECHNIQUE: Cholangiographic images from the C-arm fluoroscopic device were submitted for interpretation post-operatively. Please see the procedural report for the amount of contrast  and the fluoroscopy time utilized. COMPARISON:  Ultrasound 09/03/2015 FINDINGS: No persistent filling defects in the common duct. Intrahepatic ducts are incompletely visualized, appearing decompressed centrally. There some contrast reflux into the nondilated pancreatic duct. Contrast passes into the duodenum. : Negative for retained common duct stone. Electronically Signed   By: Lucrezia Europe M.D.   On: 09/04/2015 15:14   US Abdomen Complete  09/03/2015  CLINICAL DATA:  55 year old female with right upper quadrant abdominal pain and increased liver function tests and lipase. EXAM: ABDOMEN ULTRASOUND COMPLETE COMPARISON:  None. FINDINGS: Gallbladder: There multiple stones within the gallbladder. No gallbladder wall thickening or pericholecystic fluid. Negative sonographic Murphy's sign. Common bile duct: Diameter: 6 mm Liver: No focal lesion identified. Within normal limits in parenchymal echogenicity. IVC: No abnormality visualized. Pancreas: Visualized portion unremarkable. Spleen: Size and appearance within normal limits. Right Kidney: Length: 13 cm. Echogenicity within normal limits. No mass or hydronephrosis visualized. Left Kidney: Length: 13 cm. Echogenicity within normal limits. No mass or hydronephrosis visualized. Abdominal aorta: No aneurysm visualized. Other findings: None. IMPRESSION: Cholelithiasis without sonographic evidence of acute cholecystitis. No other sonographic abnormalities identified. Electronically Signed   By: Anner Crete M.D.   On: 09/03/2015 04:11    Microbiology: Recent Results (from the past 240 hour(s))  Surgical  pcr screen     Status: None   Collection Time: 09/04/15 11:06 AM  Result Value Ref Range Status   MRSA, PCR NEGATIVE NEGATIVE Final   Staphylococcus aureus NEGATIVE NEGATIVE Final    Comment:        The Xpert SA Assay (FDA approved for NASAL specimens in patients over 12 years of age), is one component of a comprehensive surveillance program.  Test  performance has been validated by [facility] for patients greater than or equal to 80 year old. It is not intended to diagnose infection nor to guide or monitor treatment.      Labs: Basic Metabolic Panel:  Recent Labs Lab 09/03/15 0054 09/03/15 0808 09/04/15 0056 09/05/15 0537  NA 140 139 142 140  K 3.9 3.7 3.6 3.6  CL 99* 102 106 103  CO2 29 28 28 29   GLUCOSE 130* 104* 129* 106*  BUN 25* 20 15 11   CREATININE 0.80 0.68 0.67 0.72  CALCIUM 9.1 8.3* 8.2* 8.1*  MG  --   --  1.9  --    Liver Function Tests:  Recent Labs Lab 09/03/15 0054 09/03/15 0808 09/04/15 0056 09/05/15 0537  AST 147* 352* 168* 83*  ALT 110* 253* 220* 158*  ALKPHOS 82 89 77 67  BILITOT 1.5* 1.7* 1.1 0.4  PROT 7.5 6.6 6.5 6.5  ALBUMIN 4.0 3.5 3.4* 3.2*    Recent Labs Lab 09/03/15 0054 09/04/15 0056 09/05/15 0537  LIPASE 155* 47 31   No results for input(s): AMMONIA in the last 168 hours. CBC:  Recent Labs Lab 08/31/15 0941 09/03/15 0054 09/03/15 0808 09/04/15 0056 09/05/15 0537  WBC 7.0 11.5* 6.6 5.9 8.2  NEUTROABS 4.1  --  4.2  --  5.6  HGB 13.4 13.0 12.4 12.7 11.7*  HCT 42.6 40.4 38.3 39.3 38.1  MCV 88 85.6 86.7 85.4 89.6  PLT 435* 441* 353 361 358   Cardiac Enzymes: No results for input(s): CKTOTAL, CKMB, CKMBINDEX, TROPONINI in the last 168 hours. BNP: BNP (last 3 results) No results for input(s): BNP in the last 8760 hours.  ProBNP (last 3 results) No results for input(s): PROBNP in the last 8760 hours.  CBG:  Recent Labs Lab 09/04/15 1913 09/05/15 0024 09/05/15 0540 09/05/15 0814 09/05/15 1243  GLUCAP 95 98 100* 120* 84       Signed:  Willy Vorce A MD.  [facility] 09/05/2015, 1:23 PM

## 2015-09-05 NOTE — Discharge Instructions (Signed)
Laparoscopic Cholecystectomy, Care After Refer to this sheet in the next few weeks. These instructions provide you with information about caring for yourself after your procedure. Your health care provider may also give you more specific instructions. Your treatment has been planned according to current medical practices, but problems sometimes occur. Call your health care provider if you have any problems or questions after your procedure. WHAT TO EXPECT AFTER THE PROCEDURE After your procedure, it is common to have:  Pain at your incision sites. You will be given pain medicines to control your pain.  Mild nausea or vomiting. This should improve after the first 24 hours.  Bloating and possible shoulder pain from the gas that was used during the procedure. This will improve after the first 24 hours. HOME CARE INSTRUCTIONS Incision Care  Follow instructions from your health care provider about how to take care of your incisions. Make sure you:  Wash your hands with soap and water before you change your bandage (dressing). If soap and water are not available, use hand sanitizer.  Change your dressing as told by your health care provider.  Leave stitches (sutures), skin glue, or adhesive strips in place. These skin closures may need to be in place for 2 weeks or longer. If adhesive strip edges start to loosen and curl up, you may trim the loose edges. Do not remove adhesive strips completely unless your health care provider tells you to do that.  Do not take baths, swim, or use a hot tub until your health care provider approves. Ask your health care provider if you can take showers. You may only be allowed to take sponge baths for bathing. General Instructions  Take over-the-counter and prescription medicines only as told by your health care provider.  Do not drive or operate heavy machinery while taking prescription pain medicine.  Return to your normal diet as told by your health care  provider.  Do not lift anything that is heavier than 10 lb (4.5 kg).  Do not play contact sports for one week or until your health care provider approves. SEEK MEDICAL CARE IF:   You have redness, swelling, or pain at the site of your incision.  You have fluid, blood, or pus coming from your incision.  You notice a bad smell coming from your incision area.  Your surgical incisions break open.  You have a fever. SEEK IMMEDIATE MEDICAL CARE IF:  You develop a rash.  You have difficulty breathing.  You have chest pain.  You have increasing pain in your shoulders (shoulder strap areas).  You faint or have dizzy episodes while you are standing.  You have severe pain in your abdomen.  You have nausea or vomiting that lasts for more than one day.   This information is not intended to replace advice given to you by your health care provider. Make sure you discuss any questions you have with your health care provider.   Document Released: 08/04/2005 Document Revised: 04/25/2015 Document Reviewed: 03/16/2013 Elsevier Interactive Patient Education 2016 [employer] ______[employer] LAPAROSCOPIC SURGERY: POST OP INSTRUCTIONS Always review your discharge instruction sheet given to you by the facility where your surgery was performed. IF YOU HAVE DISABILITY OR FAMILY LEAVE FORMS, YOU MUST BRING THEM TO THE OFFICE FOR PROCESSING.   DO NOT GIVE THEM TO YOUR DOCTOR.  1. A prescription for pain medication may be given to you upon discharge.  Take your pain medication as prescribed, if needed.  If narcotic pain medicine is not needed, then you may take acetaminophen (Tylenol) or ibuprofen (Advil) as needed. 2. Take your usually prescribed medications unless otherwise directed. 3. If you need a refill on your pain medication, please contact your pharmacy.  They will contact our office to request authorization. Prescriptions will not be filled after 5pm or on  week-ends. 4. You should follow a light diet the first few days after arrival home, such as soup and crackers, etc.  Be sure to include lots of fluids daily. 5. Most patients will experience some swelling and bruising in the area of the incisions.  Ice packs will help.  Swelling and bruising can take several days to resolve.  6. It is common to experience some constipation if taking pain medication after surgery.  Increasing fluid intake and taking a stool softener (such as Colace) will usually help or prevent this problem from occurring.  A mild laxative (Milk of Magnesia or Miralax) should be taken according to package instructions if there are no bowel movements after 48 hours. 7. Unless discharge instructions indicate otherwise, you may remove your bandages 24-48 hours after surgery, and you may shower at that time.  You may have steri-strips (small skin tapes) in place directly over the incision.  These strips should be left on the skin for 7-10 days.  If your surgeon used skin glue on the incision, you may shower in 24 hours.  The glue will flake off over the next 2-3 weeks.  Any sutures or staples will be removed at the office during your follow-up visit. 8. ACTIVITIES:  You may resume regular (light) daily activities beginning the next day--such as daily self-care, walking, climbing stairs--gradually increasing activities as tolerated.  You may have sexual intercourse when it is comfortable.  Refrain from any heavy lifting or straining until approved by your doctor. No lifting over 20 pounds for 4 weeks. a. You may drive when you are no longer taking prescription pain medication, you can comfortably wear a seatbelt, and you can safely maneuver your car and apply brakes. b. RETURN TO WORK:  __________________________________________________________ 9. You should see your doctor in the office for a follow-up appointment approximately 2-3 weeks after your surgery.  Make sure that you call for this  appointment within a day or two after you arrive home to insure a convenient appointment time. 10. OTHER INSTRUCTIONS: __________________________________________________________________________________________________________________________ __________________________________________________________________________________________________________________________ WHEN TO CALL YOUR DOCTOR: 1. Fever over 101.0 2. Inability to urinate 3. Continued bleeding from incision. 4. Increased pain, redness, or drainage from the incision. 5. Increasing abdominal pain  The clinic staff is available to answer your questions during regular business hours.  Please dont hesitate to call and ask to speak to one of the nurses for clinical concerns.  If you have a medical emergency, go to the nearest emergency room or call 911.  A surgeon from [facility] is always on call at the hospital. [address] [place], [place], [place]  [address] ? [address], [place], [place]   [address] [phone] ? [phone] ? FAX [phone] Web site: [email or web address]

## 2015-09-06 ENCOUNTER — Telehealth: Payer: Self-pay | Admitting: *Deleted

## 2015-09-06 NOTE — Telephone Encounter (Signed)
Patient states that she is doing okay.  She has no fever or pain.  She does have a little lower abdominal area, but it has improved.  Patient has an appointment with general surgery 09/25/15 and will follow up with Dr Regis Bill 10/01/15.  Patient is aware to call back as needed for any concerns.

## 2015-09-07 LAB — GLUCOSE, CAPILLARY: GLUCOSE-CAPILLARY: 92 mg/dL (ref 65–99)

## 2015-09-19 ENCOUNTER — Ambulatory Visit: Payer: 59 | Admitting: Internal Medicine

## 2015-10-01 ENCOUNTER — Encounter: Payer: Self-pay | Admitting: Internal Medicine

## 2015-10-01 ENCOUNTER — Ambulatory Visit (INDEPENDENT_AMBULATORY_CARE_PROVIDER_SITE_OTHER): Payer: 59 | Admitting: Internal Medicine

## 2015-10-01 VITALS — BP 146/86 | Temp 98.2°F | Wt 256.4 lb

## 2015-10-01 DIAGNOSIS — R739 Hyperglycemia, unspecified: Secondary | ICD-10-CM

## 2015-10-01 DIAGNOSIS — R945 Abnormal results of liver function studies: Secondary | ICD-10-CM

## 2015-10-01 DIAGNOSIS — R7989 Other specified abnormal findings of blood chemistry: Secondary | ICD-10-CM | POA: Diagnosis not present

## 2015-10-01 DIAGNOSIS — Z09 Encounter for follow-up examination after completed treatment for conditions other than malignant neoplasm: Secondary | ICD-10-CM | POA: Diagnosis not present

## 2015-10-01 DIAGNOSIS — I10 Essential (primary) hypertension: Secondary | ICD-10-CM | POA: Diagnosis not present

## 2015-10-01 DIAGNOSIS — K851 Biliary acute pancreatitis without necrosis or infection: Secondary | ICD-10-CM | POA: Insufficient documentation

## 2015-10-01 LAB — HEPATIC FUNCTION PANEL
ALT: 17 U/L (ref 0–35)
AST: 17 U/L (ref 0–37)
Albumin: 4.4 g/dL (ref 3.5–5.2)
Alkaline Phosphatase: 54 U/L (ref 39–117)
BILIRUBIN DIRECT: 0.1 mg/dL (ref 0.0–0.3)
BILIRUBIN TOTAL: 0.5 mg/dL (ref 0.2–1.2)
Total Protein: 7.4 g/dL (ref 6.0–8.3)

## 2015-10-01 LAB — HEMOGLOBIN A1C: HEMOGLOBIN A1C: 5.9 % (ref 4.6–6.5)

## 2015-10-01 NOTE — Progress Notes (Signed)
Pre visit review using our clinic review tool, if applicable. No additional management support is needed unless otherwise documented below in the visit note.  Chief Complaint  Patient presents with  . Follow-up    hospital  for  gall stones  adn pancreatitis .     HPI:  Danielle Martin 55 y.o. comes in for  Fu hospitalization for gallstone pancreatitis   And transaminitis  Hd fu srugery 2 7 17    After 1 17 lap choley  Lipase came to normal   lfts peaked in 300s   Last check 83/158 1 18  bg borderline up.     Lighter meals  thatn normal .  No diarrhea unless "eats greens"  hsa been receiving iv iron per heme for iorn infusion  defic   And thrombocytosis   Bp had been good  At home    Activities  Back after 2 weeks  And careful about  Lifting .  ROS: See pertinent positives and negatives per HPI. Back to work about 80 % still tired some but overall doing well  Past Medical History  Diagnosis Date  . Asthma     Worse in winter  . Allergic rhinitis     Worse in winter , grasses  . SVT (supraventricular tachycardia) (HCC)     No longer a problem  . HEMORRHAGIC CYSTITIS 05/25/2009    Qualifier: Diagnosis of  By: Regis Bill MD, Standley Brooking   . Iron deficiency anemia 05/01/2015  . Hypertension   . Pneumonia     "a few times" (09/03/2015)  . GERD (gastroesophageal reflux disease)   . [id number])     "bi-weekly" (09/03/2015)  . Migraine     "q couple months" (09/03/2015)  . Gall stone pancreatitis     lap choley 1 2017    Family History  Problem Relation Age of Onset  . Prostate cancer Father   . Other Mother     Died of asphyxiation    Social History   Social History  . Marital Status: Married    Spouse Name: N/A  . Number of Children: N/A  . Years of Education: N/A   Social History Main Topics  . Smoking status: Former Smoker -- 0.12 packs/day for 3 years    Types: Cigarettes  . Smokeless tobacco: Never Used     Comment: "stopped smoking in the 1980s"  . Alcohol Use:  4.8 oz/week    4 Glasses of wine, 4 Shots of liquor per week  . Drug Use: No  . Sexual Activity: Yes   Other Topics Concern  . None   Social History Narrative   Married   Employed [employer]  40 hours a week   HH of 2    Husband has a Economist   No ets   Drinks [employer]   Soc etoh only rare no tob ets    Outpatient Prescriptions Prior to Visit  Medication Sig Dispense Refill  . ADVAIR DISKUS 100-50 MCG/DOSE AEPB USE ONE INHALATION INTO THE LUNGS TWO TIMES A DAY OR AS DIRECTED (Patient taking differently: USE ONE INHALATION INTO THE LUNGS TWO TIMES A DAY as needed) 3 each 1  . albuterol (PROVENTIL HFA;VENTOLIN HFA) 108 (90 BASE) MCG/ACT inhaler Inhale 2 puffs into the lungs every 6 (six) hours as needed for wheezing.    Marland Kitchen albuterol (PROVENTIL) (2.5 MG/3ML) 0.083% nebulizer solution Take 3 mLs (2.5 mg total) by nebulization every 6 (six)  hours as needed for wheezing or shortness of breath. 150 mL 1  . Calcium Carbonate-Vitamin D 600-400 MG-UNIT per chew tablet Chew 1 tablet by mouth daily.    Marland Kitchen CINNAMON PO Take 1,000 Units by mouth every morning.    Marland Kitchen losartan-hydrochlorothiazide (HYZAAR) 50-12.5 MG tablet Take 1 tablet by mouth daily. 90 tablet 2  . metoprolol succinate (TOPROL-XL) 50 MG 24 hr tablet Take 1 tablet (50 mg total) by mouth daily. Take with or immediately following a meal. (Patient taking differently: Take 50 mg by mouth every evening. Take with or immediately following a meal.) 90 tablet 3  . Multiple Vitamin (MULTI-VITAMIN PO) Take 1 tablet by mouth daily.     . naproxen sodium (ANAPROX) 220 MG tablet Take 660 mg by mouth 2 (two) times daily as needed (for pain).     . vitamin E 400 UNIT capsule Take 400 Units by mouth daily.    . fluticasone (FLONASE) 50 MCG/ACT nasal spray Place 2 sprays into both nostrils daily. 2 each nostril 48 g 3  . oxyCODONE-acetaminophen (PERCOCET/ROXICET) 5-325 MG tablet Take 1-2 tablets by mouth every 4 (four) hours  as needed for moderate pain or severe pain. 30 tablet 0  . polyethylene glycol (MIRALAX / GLYCOLAX) packet Take 17 g by mouth daily. 14 each 0   No facility-administered medications prior to visit.     EXAM:  BP 146/86 mmHg  Temp(Src) 98.2 F (36.8 C) (Oral)  Wt 256 lb 6.4 oz (116.302 kg)  Body mass index is 43.99 kg/(m^2). Repeat bp 150/80 GENERAL: vitals reviewed and listed above, alert, oriented, appears well hydrated and in no acute distress no nicteric  HEENT: atraumatic, conjunctiva  clear, no obvious abnormalities on inspection of external nose and ears NECK: no obvious masses on inspection palpation  LUNGS: clear to auscultation bilaterally, no wheezes, rales or rhonchi, good air movement abd healing lap scars no g r or masses  CV: HRRR, no clubbing cyanosis or  peripheral edema nl cap refill  MS: moves all extremities without noticeable focal  abnormality PSYCH: pleasant and cooperative, no obvious depression or anxiety Lab Results  Component Value Date   WBC 8.2 09/05/2015   HGB 11.7* 09/05/2015   HCT 38.1 09/05/2015   PLT 358 09/05/2015   GLUCOSE 106* 09/05/2015   CHOL 176 11/13/2014   TRIG 177.0* 11/13/2014   HDL 46.00 11/13/2014   LDLCALC 95 11/13/2014   ALT 158* 09/05/2015   AST 83* 09/05/2015   NA 140 09/05/2015   K 3.6 09/05/2015   CL 103 09/05/2015   CREATININE 0.72 09/05/2015   BUN 11 09/05/2015   CO2 29 09/05/2015   TSH 1.71 11/13/2014   INR 1.0 01/24/2009   BP Readings from Last 3 Encounters:  10/01/15 146/86  09/05/15 167/67  08/31/15 158/85   Wt Readings from Last 3 Encounters:  10/01/15 256 lb 6.4 oz (116.302 kg)  09/05/15 262 lb 2 oz (118.9 kg)  08/31/15 258 lb (117.028 kg)    ASSESSMENT AND PLAN:  Discussed the following assessment and plan:  Abnormal LFTs - felt to be from  stones  and should return to nl add seroloties and retest.  - Plan: Hepatic function panel, Hepatitis C antibody, Hemoglobin A1c, Hepatitis B surface antigen,  Hepatitis B surface antibody  Essential hypertension - up readings today  confrim ok at home  lsi   - Plan: Hepatic function panel, Hepatitis C antibody, Hemoglobin A1c  Hospital discharge follow-up  Hyperglycemia - a1c today  -  Plan: Hepatic function panel, Hepatitis C antibody, Hemoglobin A1c revewied plan and fu depending on labs and bp  -Patient advised to return or notify health care team  if symptoms worsen ,persist or new concerns arise.  Patient Instructions  Will notify you  of labs when available. Checking liver  Panel should  Return to normal Take blood pressure readings twice a day for about 10 days and then periodically .To ensure below 140/90   .Send in readings     My chart to document that they are in healthy range .  Healthy eating and activity   Mediterranean diet  Is the healthiest.    Standley Brooking. Jules Baty M.D.

## 2015-10-01 NOTE — Patient Instructions (Addendum)
Will notify you  of labs when available. Checking liver  Panel should  Return to normal Take blood pressure readings twice a day for about 10 days and then periodically .To ensure below 140/90   .Send in readings     My chart to document that they are in healthy range .  Healthy eating and activity   Mediterranean diet  Is the healthiest.

## 2015-10-02 LAB — HEPATITIS B SURFACE ANTIBODY,QUALITATIVE: Hep B S Ab: NEGATIVE

## 2015-10-02 LAB — HEPATITIS C ANTIBODY: HCV Ab: NEGATIVE

## 2015-10-02 LAB — HEPATITIS B SURFACE ANTIGEN: Hepatitis B Surface Ag: NEGATIVE

## 2015-11-04 ENCOUNTER — Other Ambulatory Visit: Payer: Self-pay | Admitting: Internal Medicine

## 2015-11-05 NOTE — Telephone Encounter (Signed)
Sent to the pharmacy by e-scribe.  Pt has upcoming cpx on 04/08/16

## 2016-01-02 ENCOUNTER — Encounter: Payer: Self-pay | Admitting: Family

## 2016-01-02 ENCOUNTER — Other Ambulatory Visit (HOSPITAL_BASED_OUTPATIENT_CLINIC_OR_DEPARTMENT_OTHER): Payer: 59

## 2016-01-02 ENCOUNTER — Ambulatory Visit (HOSPITAL_BASED_OUTPATIENT_CLINIC_OR_DEPARTMENT_OTHER): Payer: 59 | Admitting: Family

## 2016-01-02 VITALS — BP 132/70 | HR 70 | Temp 98.1°F | Resp 14 | Ht 64.0 in | Wt 257.0 lb

## 2016-01-02 DIAGNOSIS — D473 Essential (hemorrhagic) thrombocythemia: Secondary | ICD-10-CM

## 2016-01-02 DIAGNOSIS — D509 Iron deficiency anemia, unspecified: Secondary | ICD-10-CM

## 2016-01-02 DIAGNOSIS — D75839 Thrombocytosis, unspecified: Secondary | ICD-10-CM

## 2016-01-02 LAB — IRON AND TIBC
%SAT: 15 % — AB (ref 21–57)
IRON: 48 ug/dL (ref 41–142)
TIBC: 321 ug/dL (ref 236–444)
UIBC: 273 ug/dL (ref 120–384)

## 2016-01-02 LAB — CBC WITH DIFFERENTIAL (CANCER CENTER ONLY)
BASO#: 0 10*3/uL (ref 0.0–0.2)
BASO%: 0.4 % (ref 0.0–2.0)
EOS ABS: 0.6 10*3/uL — AB (ref 0.0–0.5)
EOS%: 6 % (ref 0.0–7.0)
HCT: 40.1 % (ref 34.8–46.6)
HGB: 13.1 g/dL (ref 11.6–15.9)
LYMPH#: 2.1 10*3/uL (ref 0.9–3.3)
LYMPH%: 22 % (ref 14.0–48.0)
MCH: 27.9 pg (ref 26.0–34.0)
MCHC: 32.7 g/dL (ref 32.0–36.0)
MCV: 85 fL (ref 81–101)
MONO#: 0.5 10*3/uL (ref 0.1–0.9)
MONO%: 4.9 % (ref 0.0–13.0)
NEUT#: 6.4 10*3/uL (ref 1.5–6.5)
NEUT%: 66.7 % (ref 39.6–80.0)
PLATELETS: 461 10*3/uL — AB (ref 145–400)
RBC: 4.7 10*6/uL (ref 3.70–5.32)
RDW: 14.5 % (ref 11.1–15.7)
WBC: 9.6 10*3/uL (ref 3.9–10.0)

## 2016-01-02 LAB — COMPREHENSIVE METABOLIC PANEL
ALT: 20 U/L (ref 0–55)
ANION GAP: 9 meq/L (ref 3–11)
AST: 16 U/L (ref 5–34)
Albumin: 3.9 g/dL (ref 3.5–5.0)
Alkaline Phosphatase: 55 U/L (ref 40–150)
BUN: 20.4 mg/dL (ref 7.0–26.0)
CALCIUM: 9.5 mg/dL (ref 8.4–10.4)
CHLORIDE: 100 meq/L (ref 98–109)
CO2: 32 mEq/L — ABNORMAL HIGH (ref 22–29)
Creatinine: 0.8 mg/dL (ref 0.6–1.1)
EGFR: 90 mL/min/{1.73_m2} — AB (ref >90)
Glucose: 96 mg/dl (ref 70–140)
POTASSIUM: 4.1 meq/L (ref 3.5–5.1)
Sodium: 140 mEq/L (ref 136–145)
Total Bilirubin: 0.64 mg/dL (ref 0.20–1.20)
Total Protein: 7.7 g/dL (ref 6.4–8.3)

## 2016-01-02 LAB — CHCC SATELLITE - SMEAR

## 2016-01-02 LAB — FERRITIN: FERRITIN: 37 ng/mL (ref 9–269)

## 2016-01-02 NOTE — Progress Notes (Signed)
Hematology and Oncology Follow Up Visit  Danielle Martin [id number] 05/19/1961 55 y.o. 01/02/2016   Principle Diagnosis:  Thrombocytosis Iron deficiency anemia  Current Therapy:   IV iron as indicated Aspirin 81 mg by mouth every other day    Interim History:  Danielle Martin is here today for follow-up. She has been feeling fatigued.  Right after we saw her in January she was hospitalized with abdominal pain and had her gallbladder removed. She has had no other issues since then.   Her iron saturation in January was 19% with a ferritin of 22.  No episodes of bleeding, bruising or petechiae. No lymphadenopathy found on exam.  No fever, chills, n/v, cough, rash, dizziness, SOB, chest pain, palpitations, abdominal pain or changes in bowel or bladder habits.  No swelling, tenderness, numbness or tingling in her extremities. She has no c/o joint pain.  She has a healthy appetite and is learning what foods she can tolerate since having her gallbladder removed. Sadly she can no longer tolerate red wine. She is staying well hydrated. Her weight is stable.   Medications:    Medication List       This list is accurate as of: 01/02/16  9:41 AM.  Always use your most recent med list.               ADVAIR DISKUS 100-50 MCG/DOSE Aepb  Generic drug:  Fluticasone-Salmeterol  USE ONE INHALATION INTO THE LUNGS TWO TIMES A DAY OR AS DIRECTED     albuterol 108 (90 Base) MCG/ACT inhaler  Commonly known as:  PROVENTIL HFA;VENTOLIN HFA  Inhale 2 puffs into the lungs every 6 (six) hours as needed for wheezing.     albuterol (2.5 MG/3ML) 0.083% nebulizer solution  Commonly known as:  PROVENTIL  Take 3 mLs (2.5 mg total) by nebulization every 6 (six) hours as needed for wheezing or shortness of breath.     Calcium Carbonate-Vitamin D 600-400 MG-UNIT chew tablet  Chew 1 tablet by mouth daily.     CINNAMON PO  Take 1,000 Units by mouth every morning.     fluticasone 50 MCG/ACT nasal spray  Commonly  known as:  FLONASE  Place 2 sprays into both nostrils daily. 2 each nostril     losartan-hydrochlorothiazide 50-12.5 MG tablet  Commonly known as:  HYZAAR  Take 1 tablet by mouth daily.     metoprolol succinate 50 MG 24 hr tablet  Commonly known as:  TOPROL-XL  TAKE 1 TABLET DAILY. TAKE  WITH OR IMMEDIATELY        FOLLOWING A MEAL.     MULTI-VITAMIN PO  Take 1 tablet by mouth daily.     naproxen sodium 220 MG tablet  Commonly known as:  ANAPROX  Take 660 mg by mouth 2 (two) times daily as needed (for pain).     vitamin E 400 UNIT capsule  Take 400 Units by mouth daily.        Allergies:  Allergies  Allergen Reactions  . Codeine Anaphylaxis    Hallucinations  . Ace Inhibitors Cough    Past Medical History, Surgical history, Social history, and Family History were reviewed and updated.  Review of Systems: All other 10 point review of systems is negative.   Physical Exam:  vitals were not taken for this visit.  Wt Readings from Last 3 Encounters:  10/01/15 256 lb 6.4 oz (116.302 kg)  09/05/15 262 lb 2 oz (118.9 kg)  08/31/15 258 lb (117.028 kg)  Ocular: Sclerae unicteric, pupils equal, round and reactive to light Ear-nose-throat: Oropharynx clear, dentition fair Lymphatic: No cervical supraclavicular or axillary adenopathy Lungs no rales or rhonchi, good excursion bilaterally Heart regular rate and rhythm, no murmur appreciated Abd soft, nontender, positive bowel sounds, no liver or spleen tip palpated on exam, no fluid wave MSK no focal spinal tenderness, no joint edema Neuro: non-focal, well-oriented, appropriate affect Breasts: Deferred  Lab Results  Component Value Date   WBC 9.6 01/02/2016   HGB 13.1 01/02/2016   HCT 40.1 01/02/2016   MCV 85 01/02/2016   PLT 461* 01/02/2016   Lab Results  Component Value Date   FERRITIN 22 08/31/2015   IRON 64 08/31/2015   TIBC 333 08/31/2015   UIBC 269 08/31/2015   IRONPCTSAT 19* 08/31/2015   Lab Results    Component Value Date   RBC 4.70 01/02/2016   No results found for: KPAFRELGTCHN, LAMBDASER, KAPLAMBRATIO No results found for: IGGSERUM, IGA, IGMSERUM No results found for: Odetta Pink, SPEI   Chemistry      Component Value Date/Time   NA 140 09/05/2015 0537   K 3.6 09/05/2015 0537   CL 103 09/05/2015 0537   CO2 29 09/05/2015 0537   BUN 11 09/05/2015 0537   CREATININE 0.72 09/05/2015 0537      Component Value Date/Time   CALCIUM 8.1* 09/05/2015 0537   CALCIUM 8.7 02/24/2007 1630   ALKPHOS 54 10/01/2015 1413   AST 17 10/01/2015 1413   ALT 17 10/01/2015 1413   BILITOT 0.5 10/01/2015 1413     Impression and Plan: Danielle Martin is 55 year old white female with iron deficiency anemia and reactive thrombocytosis. She is symptomatic at this time with fatigue. Her Hgb is stable at 13.1 with an MCV of 85. Her platelet count is 461.  We will see what her iron studies show and bring her in later this week for an infusion if needed. We will plan to see her back in 4 months for labs and follow-up.  She will contact us with any questions or concerns. We can certainly see her sooner if need be.    Eliezer Bottom, NP 5/17/20179:41 AM

## 2016-01-03 LAB — RETICULOCYTES: Reticulocyte Count: 1.6 % (ref 0.6–2.6)

## 2016-01-04 ENCOUNTER — Other Ambulatory Visit: Payer: Self-pay | Admitting: *Deleted

## 2016-01-04 DIAGNOSIS — D509 Iron deficiency anemia, unspecified: Secondary | ICD-10-CM

## 2016-01-10 ENCOUNTER — Ambulatory Visit (HOSPITAL_BASED_OUTPATIENT_CLINIC_OR_DEPARTMENT_OTHER): Payer: 59

## 2016-01-10 VITALS — BP 124/64 | HR 64 | Temp 98.2°F | Resp 18

## 2016-01-10 DIAGNOSIS — D509 Iron deficiency anemia, unspecified: Secondary | ICD-10-CM | POA: Diagnosis not present

## 2016-01-10 MED ORDER — SODIUM CHLORIDE 0.9 % IV SOLN
Freq: Once | INTRAVENOUS | Status: AC
  Administered 2016-01-10: 13:00:00 via INTRAVENOUS

## 2016-01-10 MED ORDER — SODIUM CHLORIDE 0.9 % IV SOLN
510.0000 mg | Freq: Once | INTRAVENOUS | Status: AC
  Administered 2016-01-10: 510 mg via INTRAVENOUS
  Filled 2016-01-10: qty 17

## 2016-01-10 NOTE — Patient Instructions (Signed)

## 2016-01-30 ENCOUNTER — Ambulatory Visit (INDEPENDENT_AMBULATORY_CARE_PROVIDER_SITE_OTHER): Payer: 59 | Admitting: Internal Medicine

## 2016-01-30 ENCOUNTER — Encounter: Payer: Self-pay | Admitting: Internal Medicine

## 2016-01-30 VITALS — BP 120/70 | HR 91 | Temp 98.7°F | Wt 251.3 lb

## 2016-01-30 DIAGNOSIS — J45909 Unspecified asthma, uncomplicated: Secondary | ICD-10-CM

## 2016-01-30 DIAGNOSIS — J069 Acute upper respiratory infection, unspecified: Secondary | ICD-10-CM

## 2016-01-30 DIAGNOSIS — Z8709 Personal history of other diseases of the respiratory system: Secondary | ICD-10-CM

## 2016-01-30 DIAGNOSIS — J209 Acute bronchitis, unspecified: Secondary | ICD-10-CM | POA: Diagnosis not present

## 2016-01-30 DIAGNOSIS — R509 Fever, unspecified: Secondary | ICD-10-CM

## 2016-01-30 NOTE — Patient Instructions (Signed)
Acts like a viral  Bronchitis  Infection that usually resolves on its own but can flaree up asthma  Stay home  If fever  Over 100 tonight then get chest xray  And contact us about your status. In the interime  comfort measures and begin your albuterol  2 inhaltaions every 6 hours  For cough  Stay on adviar.   Acute Bronchitis Bronchitis is inflammation of the airways that extend from the windpipe into the lungs (bronchi). The inflammation often causes mucus to develop. This leads to a cough, which is the most common symptom of bronchitis.  In acute bronchitis, the condition usually develops suddenly and goes away over time, usually in a couple weeks. Smoking, allergies, and asthma can make bronchitis worse. Repeated episodes of bronchitis may cause further lung problems.  CAUSES Acute bronchitis is most often caused by the same virus that causes a cold. The virus can spread from person to person (contagious) through coughing, sneezing, and touching contaminated objects. SIGNS AND SYMPTOMS   Cough.   Fever.   Coughing up mucus.   Body aches.   Chest congestion.   Chills.   Shortness of breath.   Sore throat.  DIAGNOSIS  Acute bronchitis is usually diagnosed through a physical exam. Your health care provider will also ask you questions about your medical history. Tests, such as chest X-rays, are sometimes done to rule out other conditions.  TREATMENT  Acute bronchitis usually goes away in a couple weeks. Oftentimes, no medical treatment is necessary. Medicines are sometimes given for relief of fever or cough. Antibiotic medicines are usually not needed but may be prescribed in certain situations. In some cases, an inhaler may be recommended to help reduce shortness of breath and control the cough. A cool mist vaporizer may also be used to help thin bronchial secretions and make it easier to clear the chest.  HOME CARE INSTRUCTIONS  Get plenty of rest.   Drink enough fluids  to keep your urine clear or pale yellow (unless you have a medical condition that requires fluid restriction). Increasing fluids may help thin your respiratory secretions (sputum) and reduce chest congestion, and it will prevent dehydration.   Take medicines only as directed by your health care provider.  If you were prescribed an antibiotic medicine, finish it all even if you start to feel better.  Avoid smoking and secondhand smoke. Exposure to cigarette smoke or irritating chemicals will make bronchitis worse. If you are a smoker, consider using nicotine gum or skin patches to help control withdrawal symptoms. Quitting smoking will help your lungs heal faster.   Reduce the chances of another bout of acute bronchitis by washing your hands frequently, avoiding people with cold symptoms, and trying not to touch your hands to your mouth, nose, or eyes.   Keep all follow-up visits as directed by your health care provider.  SEEK MEDICAL CARE IF: Your symptoms do not improve after 1 week of treatment.  SEEK IMMEDIATE MEDICAL CARE IF:  You develop an increased fever or chills.   You have chest pain.   You have severe shortness of breath.  You have bloody sputum.   You develop dehydration.  You faint or repeatedly feel like you are going to pass out.  You develop repeated vomiting.  You develop a severe headache. MAKE SURE YOU:   Understand these instructions.  Will watch your condition.  Will get help right away if you are not doing well or get worse.   This  information is not intended to replace advice given to you by your health care provider. Make sure you discuss any questions you have with your health care provider.   Document Released: 09/11/2004 Document Revised: 08/25/2014 Document Reviewed: 01/25/2013 Elsevier Interactive Patient Education [employer].

## 2016-01-30 NOTE — Progress Notes (Signed)
Pre visit review using our clinic review tool, if applicable. No additional management support is needed unless otherwise documented below in the visit note.  Chief Complaint  Patient presents with  . Cough  . Sore Throat  . Nasal Congestion    HPI: Danielle Martin 55 y.o.  sda appt  See above  "I think I have bronchitis   ..." Husband  Sick last week  And  Now she  has similar sx feels bad  Sore throat  3 days ago and  Now cough worse  Cough .  Fever 101 yesterday .? Not today   corcidon  hpb  Flu sx  . Little with nasal congestion . dc No inhlaer  Use recnetly   advair     cointroller .  Remote hx of pna  And bronchitis   ROS: See pertinent positives and negatives per HPI. No pleurisy hemoptysis   Past Medical History  Diagnosis Date  . Asthma     Worse in winter  . Allergic rhinitis     Worse in winter , grasses  . SVT (supraventricular tachycardia) (HCC)     No longer a problem  . HEMORRHAGIC CYSTITIS 05/25/2009    Qualifier: Diagnosis of  By: Regis Bill MD, Standley Brooking   . Iron deficiency anemia 05/01/2015  . Hypertension   . Pneumonia     "a few times" (09/03/2015)  . GERD (gastroesophageal reflux disease)   . [id number])     "bi-weekly" (09/03/2015)  . Migraine     "q couple months" (09/03/2015)  . Gall stone pancreatitis     lap choley 1 2017    Family History  Problem Relation Age of Onset  . Prostate cancer Father   . Other Mother     Died of asphyxiation    Social History   Social History  . Marital Status: Married    Spouse Name: N/A  . Number of Children: N/A  . Years of Education: N/A   Social History Main Topics  . Smoking status: Former Smoker -- 0.12 packs/day for 3 years    Types: Cigarettes  . Smokeless tobacco: Never Used     Comment: "stopped smoking in the 1980s"  . Alcohol Use: 4.8 oz/week    4 Glasses of wine, 4 Shots of liquor per week  . Drug Use: No  . Sexual Activity: Yes   Other Topics Concern  . None   Social History Narrative     Married   Employed [employer]  40 hours a week   HH of 2    Husband has a Economist   No ets   Drinks [employer]   Soc etoh only rare no tob ets    Outpatient Prescriptions Prior to Visit  Medication Sig Dispense Refill  . ADVAIR DISKUS 100-50 MCG/DOSE AEPB USE ONE INHALATION INTO THE LUNGS TWO TIMES A DAY OR AS DIRECTED (Patient taking differently: USE ONE INHALATION INTO THE LUNGS TWO TIMES A DAY as needed) 3 each 1  . albuterol (PROVENTIL HFA;VENTOLIN HFA) 108 (90 BASE) MCG/ACT inhaler Inhale 2 puffs into the lungs every 6 (six) hours as needed for wheezing.    Marland Kitchen albuterol (PROVENTIL) (2.5 MG/3ML) 0.083% nebulizer solution Take 3 mLs (2.5 mg total) by nebulization every 6 (six) hours as needed for wheezing or shortness of breath. 150 mL 1  . Calcium Carbonate-Vitamin D 600-400 MG-UNIT per chew tablet Chew 1 tablet by mouth daily.    Marland Kitchen  CINNAMON PO Take 1,000 Units by mouth every morning.    Marland Kitchen losartan-hydrochlorothiazide (HYZAAR) 50-12.5 MG tablet Take 1 tablet by mouth daily. 90 tablet 2  . metoprolol succinate (TOPROL-XL) 50 MG 24 hr tablet TAKE 1 TABLET DAILY. TAKE  WITH OR IMMEDIATELY        FOLLOWING A MEAL. 90 tablet 1  . Multiple Vitamin (MULTI-VITAMIN PO) Take 1 tablet by mouth daily.     . vitamin E 400 UNIT capsule Take 400 Units by mouth daily.    . fluticasone (FLONASE) 50 MCG/ACT nasal spray Place 2 sprays into both nostrils daily. 2 each nostril 48 g 3  . naproxen sodium (ANAPROX) 220 MG tablet Take 660 mg by mouth 2 (two) times daily as needed (for pain).      No facility-administered medications prior to visit.     EXAM:  BP 120/70 mmHg  Pulse 91  Temp(Src) 98.7 F (37.1 C) (Oral)  Wt 251 lb 4.8 oz (113.989 kg)  SpO2 95%  Body mass index is 43.11 kg/(m^2).  GENERAL: vitals reviewed and listed above, alert, oriented, appears well hydrated and in no acute distress looks tired  Normal resp  Congestion  Nl speech HEENT: atraumatic,  conjunctiva  clear, no obvious abnormalities on inspection of external nose and ears  tms nlr face min tender OP : no lesion edema or exudate  NECK: no obvious masses on inspection palpation  LUNGS: clear to auscultation bilaterally, no wheezes, rales or rhonchi, ? Air movemnt   Dry bronchial cough when asked to cough  CV: HRRR, no clubbing cyanosis or  peripheral edema nl cap refill  MS: moves all extremities without noticeable focal  Abnormality Skin: normal capillary refill ,turgor , color: No acute rashes ,petechiae or bruising   ASSESSMENT AND PLAN:  Discussed the following assessment and plan:  Acute bronchitis, unspecified organism - Plan: DG Chest 2 View  Low grade fever - Plan: DG Chest 2 View  URI, acute  Asthma, unspecified asthma severity, uncomplicated  Hx of extrinsic asthma History compatible with communicable viral respiratory infection. Based on husbands illness. She has higher risk for lower airspace disease and asthma flare. At this time antibiotics would probably cause more harm than help however low threshold to get chest x-ray follow-up consider adding if appropriate. Have her start her bronchodilator which may help the cough. Hot tea and honey and she can cannot take narcotic cough medicines. And most medicines don't help her that much anyway. Stay hydrated rest contact us tomorrow she is a fever tonight and go get her chest x-ray and let us know follow-up appropriate plan. -Patient advised to return or notify health care team  if symptoms worsen ,persist or new concerns arise.  Patient Instructions  Acts like a viral  Bronchitis  Infection that usually resolves on its own but can flaree up asthma  Stay home  If fever  Over 100 tonight then get chest xray  And contact us about your status. In the interime  comfort measures and begin your albuterol  2 inhaltaions every 6 hours  For cough  Stay on adviar.   Acute Bronchitis Bronchitis is inflammation of the  airways that extend from the windpipe into the lungs (bronchi). The inflammation often causes mucus to develop. This leads to a cough, which is the most common symptom of bronchitis.  In acute bronchitis, the condition usually develops suddenly and goes away over time, usually in a couple weeks. Smoking, allergies, and asthma can make bronchitis  worse. Repeated episodes of bronchitis may cause further lung problems.  CAUSES Acute bronchitis is most often caused by the same virus that causes a cold. The virus can spread from person to person (contagious) through coughing, sneezing, and touching contaminated objects. SIGNS AND SYMPTOMS   Cough.   Fever.   Coughing up mucus.   Body aches.   Chest congestion.   Chills.   Shortness of breath.   Sore throat.  DIAGNOSIS  Acute bronchitis is usually diagnosed through a physical exam. Your health care provider will also ask you questions about your medical history. Tests, such as chest X-rays, are sometimes done to rule out other conditions.  TREATMENT  Acute bronchitis usually goes away in a couple weeks. Oftentimes, no medical treatment is necessary. Medicines are sometimes given for relief of fever or cough. Antibiotic medicines are usually not needed but may be prescribed in certain situations. In some cases, an inhaler may be recommended to help reduce shortness of breath and control the cough. A cool mist vaporizer may also be used to help thin bronchial secretions and make it easier to clear the chest.  HOME CARE INSTRUCTIONS  Get plenty of rest.   Drink enough fluids to keep your urine clear or pale yellow (unless you have a medical condition that requires fluid restriction). Increasing fluids may help thin your respiratory secretions (sputum) and reduce chest congestion, and it will prevent dehydration.   Take medicines only as directed by your health care provider.  If you were prescribed an antibiotic medicine, finish it  all even if you start to feel better.  Avoid smoking and secondhand smoke. Exposure to cigarette smoke or irritating chemicals will make bronchitis worse. If you are a smoker, consider using nicotine gum or skin patches to help control withdrawal symptoms. Quitting smoking will help your lungs heal faster.   Reduce the chances of another bout of acute bronchitis by washing your hands frequently, avoiding people with cold symptoms, and trying not to touch your hands to your mouth, nose, or eyes.   Keep all follow-up visits as directed by your health care provider.  SEEK MEDICAL CARE IF: Your symptoms do not improve after 1 week of treatment.  SEEK IMMEDIATE MEDICAL CARE IF:  You develop an increased fever or chills.   You have chest pain.   You have severe shortness of breath.  You have bloody sputum.   You develop dehydration.  You faint or repeatedly feel like you are going to pass out.  You develop repeated vomiting.  You develop a severe headache. MAKE SURE YOU:   Understand these instructions.  Will watch your condition.  Will get help right away if you are not doing well or get worse.   This information is not intended to replace advice given to you by your health care provider. Make sure you discuss any questions you have with your health care provider.   Document Released: 09/11/2004 Document Revised: 08/25/2014 Document Reviewed: 01/25/2013 Elsevier Interactive Patient Education 2016 Maury K. Linsay Vogt M.D.

## 2016-03-01 ENCOUNTER — Other Ambulatory Visit: Payer: Self-pay | Admitting: Internal Medicine

## 2016-03-04 NOTE — Telephone Encounter (Signed)
Sent to the pharmacy by e-scribe.  Pt has appt on 04/08/16

## 2016-04-01 ENCOUNTER — Other Ambulatory Visit (INDEPENDENT_AMBULATORY_CARE_PROVIDER_SITE_OTHER): Payer: 59

## 2016-04-01 ENCOUNTER — Other Ambulatory Visit: Payer: 59 | Admitting: Internal Medicine

## 2016-04-01 DIAGNOSIS — Z0000 Encounter for general adult medical examination without abnormal findings: Secondary | ICD-10-CM

## 2016-04-01 LAB — BASIC METABOLIC PANEL
BUN: 17 mg/dL (ref 6–23)
CALCIUM: 9.6 mg/dL (ref 8.4–10.5)
CHLORIDE: 97 meq/L (ref 96–112)
CO2: 33 mEq/L — ABNORMAL HIGH (ref 19–32)
CREATININE: 0.67 mg/dL (ref 0.40–1.20)
GFR: 97.82 mL/min (ref >60.00)
Glucose, Bld: 99 mg/dL (ref 70–99)
Potassium: 3.8 mEq/L (ref 3.5–5.1)
Sodium: 138 mEq/L (ref 135–145)

## 2016-04-01 LAB — HEPATIC FUNCTION PANEL
ALK PHOS: 52 U/L (ref 39–117)
ALT: 18 U/L (ref 0–35)
AST: 17 U/L (ref 0–37)
Albumin: 4.3 g/dL (ref 3.5–5.2)
BILIRUBIN DIRECT: 0.1 mg/dL (ref 0.0–0.3)
BILIRUBIN TOTAL: 0.6 mg/dL (ref 0.2–1.2)
TOTAL PROTEIN: 7.4 g/dL (ref 6.0–8.3)

## 2016-04-01 LAB — LIPID PANEL
CHOL/HDL RATIO: 4
Cholesterol: 187 mg/dL (ref 0–200)
HDL: 45 mg/dL (ref >39.00)
NONHDL: 142.36
TRIGLYCERIDES: 255 mg/dL — AB (ref 0.0–149.0)
VLDL: 51 mg/dL — ABNORMAL HIGH (ref 0.0–40.0)

## 2016-04-01 LAB — CBC WITH DIFFERENTIAL/PLATELET
BASOS ABS: 0 10*3/uL (ref 0.0–0.1)
Basophils Relative: 0.4 % (ref 0.0–3.0)
EOS ABS: 0.4 10*3/uL (ref 0.0–0.7)
Eosinophils Relative: 4.3 % (ref 0.0–5.0)
HEMATOCRIT: 41.2 % (ref 36.0–46.0)
HEMOGLOBIN: 13.9 g/dL (ref 12.0–15.0)
LYMPHS PCT: 19 % (ref 12.0–46.0)
Lymphs Abs: 2 10*3/uL (ref 0.7–4.0)
MCHC: 33.6 g/dL (ref 30.0–36.0)
MCV: 85.6 fl (ref 78.0–100.0)
MONO ABS: 0.5 10*3/uL (ref 0.1–1.0)
Monocytes Relative: 4.8 % (ref 3.0–12.0)
Neutro Abs: 7.6 10*3/uL (ref 1.4–7.7)
Neutrophils Relative %: 71.5 % (ref 43.0–77.0)
Platelets: 444 10*3/uL — ABNORMAL HIGH (ref 150.0–400.0)
RBC: 4.81 Mil/uL (ref 3.87–5.11)
RDW: 14.8 % (ref 11.5–15.5)
WBC: 10.6 10*3/uL — AB (ref 4.0–10.5)

## 2016-04-01 LAB — LDL CHOLESTEROL, DIRECT: Direct LDL: 109 mg/dL

## 2016-04-01 LAB — TSH: TSH: 2.18 u[IU]/mL (ref 0.35–4.50)

## 2016-04-07 NOTE — Progress Notes (Signed)
Pre visit review using our clinic review tool, if applicable. No additional management support is needed unless otherwise documented below in the visit note.  Chief Complaint  Patient presents with  . Annual Exam    HPI: Patient  Danielle Martin  55 y.o. comes in today for Preventive Health Care visit  Not eating as healthy .  Not paying attention not  New job more house  But aware  Has seen nutrition in past.  Bp good no sx  asthma stable  Gets burning epigastric high when red wine otherwise no gi sx of significance   Health Maintenance  Topic Date Due  . INFLUENZA VACCINE  03/18/2016  . HIV Screening  04/08/2017 (Originally 02/22/1976)  . MAMMOGRAM  12/24/2016  . TETANUS/TDAP  12/24/2020  . COLONOSCOPY  02/19/2021  . Hepatitis C Screening  Completed   Health Maintenance Review LIFESTYLE:  Exercise:   Not recently   stress new job.  But like its  Tends to eat later with husband  Tobacco/ETS: no Alcohol:  Weekend s Sugar beverages: Sleep: 6 hours  Drug use: no h of 2 no pets  New job mpore stress   Hours 55     ROS:  GEN/ HEENT: No fever, significant weight changes sweats headaches vision problems hearing changes, CV/ PULM; No chest pain shortness of breath cough, syncope,edema  change in exercise tolerance. GI /GU: No adominal pain, vomiting, change in bowel habits. No blood in the stool. No significant GU symptoms. SKIN/HEME: ,no acute skin rashes suspicious lesions or bleeding. No lymphadenopathy, nodules, masses.  NEURO/ PSYCH:  No neurologic signs such as weakness numbness. No depression anxiety. IMM/ Allergy: No unusual infections.  Allergy .   REST of 12 system review negative except as per HPI   Past Medical History:  Diagnosis Date  . Allergic rhinitis    Worse in winter , grasses  . Asthma    Worse in winter  . Gall stone pancreatitis    lap choley 1 2017  . GERD (gastroesophageal reflux disease)   . AOZHYQMV(784.6)    "bi-weekly" (09/03/2015)  .  HEMORRHAGIC CYSTITIS 05/25/2009   Qualifier: Diagnosis of  By: Regis Bill MD, Standley Brooking   . Hypertension   . Iron deficiency anemia 05/01/2015  . Migraine    "q couple months" (09/03/2015)  . Pneumonia    "a few times" (09/03/2015)  . SVT (supraventricular tachycardia) (HCC)    No longer a problem    Past Surgical History:  Procedure Laterality Date  . ANTERIOR CERVICAL DECOMP/DISCECTOMY FUSION  2008  . BACK SURGERY    . CESAREAN SECTION  1990; 1991  . CHOLECYSTECTOMY N/A 09/04/2015   Procedure: LAPAROSCOPIC CHOLECYSTECTOMY WITH INTRAOPERATIVE CHOLANGIOGRAM;  Surgeon: Greer Pickerel, MD;  Location: [facility];  Service: General;  Laterality: N/A;  . OOPHORECTOMY  1980s   "all of the right; part of the left"  . VAGINAL HYSTERECTOMY  2010    Family History  Problem Relation Age of Onset  . Prostate cancer Father   . Other Mother     Died of asphyxiation    Social History   Social History  . Marital status: Married    Spouse name: N/A  . Number of children: N/A  . Years of education: N/A   Social History Main Topics  . Smoking status: Former Smoker    Packs/day: 0.12    Years: 3.00    Types: Cigarettes  . Smokeless tobacco: Never Used     Comment: "stopped  smoking in the 1980s"  . Alcohol use 4.8 oz/week    4 Glasses of wine, 4 Shots of liquor per week  . Drug use: No  . Sexual activity: Yes   Other Topics Concern  . 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 ADVAIR DISKUS 100-50 MCG/DOSE AEPB USE ONE INHALATION INTO THE LUNGS TWO TIMES A DAY OR AS DIRECTED (Patient taking differently: USE ONE INHALATION INTO THE LUNGS TWO TIMES A DAY as needed) 3 each 1  . albuterol (PROVENTIL HFA;VENTOLIN HFA) 108 (90  BASE) MCG/ACT inhaler Inhale 2 puffs into the lungs every 6 (six) hours as needed for wheezing.    Marland Kitchen albuterol (PROVENTIL) (2.5 MG/3ML) 0.083% nebulizer solution Take 3 mLs (2.5 mg total) by nebulization every 6 (six) hours as needed for wheezing or shortness of breath. 150 mL 1  . Calcium Carbonate-Vitamin D 600-400 MG-UNIT per chew tablet Chew 1 tablet by mouth daily.    Marland Kitchen CINNAMON PO Take 1,000 Units by mouth every morning.    Marland Kitchen losartan-hydrochlorothiazide (HYZAAR) 50-12.5 MG tablet TAKE 1 TABLET DAILY 90 tablet 0  . metoprolol succinate (TOPROL-XL) 50 MG 24 hr tablet TAKE 1 TABLET DAILY. TAKE  WITH OR IMMEDIATELY        FOLLOWING A MEAL. 90 tablet 1  . Multiple Vitamin (MULTI-VITAMIN PO) Take 1 tablet by mouth daily.     . vitamin E 400 UNIT capsule Take 400 Units by mouth daily.    . fluticasone (FLONASE) 50 MCG/ACT nasal spray Place 2 sprays into both nostrils daily. 2 each nostril 48 g 3   No facility-administered medications prior to visit.      EXAM:  BP 136/86 (BP Location: Right Arm, Patient Position: Sitting, Cuff Size: Large)   Temp 98.1 F (36.7 C) (Oral)   Ht 5' 4.25" (1.632 m)   Wt 253 lb 6.4 oz (114.9 kg)   BMI 43.16 kg/m   Body mass index is 43.16 kg/m.  Physical Exam: Vital signs reviewed SHU:OHFG is a well-developed well-nourished alert cooperative    who appearsr stated age in no acute distress.  HEENT: normocephalic atraumatic , Eyes: PERRL EOM's full, conjunctiva clear, Nares: paten,t no deformity discharge or tenderness., Ears: no deformity EAC's clear TMs with normal landmarks. Mouth: clear OP, no lesions, edema.  Moist mucous membranes. Dentition in adequate repair. NECK: supple without masses, thyromegaly or bruits. CHEST/PULM:  Clear to auscultation and percussion breath sounds equal no wheeze , rales or rhonchi. No chest wall deformities or tenderness. CV: PMI is nondisplaced, S1 S2 no gallops, murmurs, rubs. Peripheral pulses are full without delay.No  JVD . Breast: normal by inspection . No dimpling, discharge, masses, tenderness or discharge . ABDOMEN: Bowel sounds normal nontender  No guard or rebound, no hepato splenomegal no CVA tenderness.  No hernia. Extremtities:  No clubbing cyanosis or edema, no acute joint swelling or redness no focal atrophy NEURO:  Oriented x3, cranial nerves 3-12 appear to be intact, no obvious focal weakness,gait within normal limits no abnormal reflexes or asymmetrical SKIN: No acute rashes normal turgor, color, no bruising  or petechiae. PSYCH: Oriented, good eye contact, no obvious depression anxiety, cognition and judgment appear normal. LN: no cervical axillary inguinal adenopathy  Lab Results  Component Value Date   WBC 10.6 (H) 04/01/2016   HGB 13.9 04/01/2016   HCT 41.2 04/01/2016   PLT 444.0 (H) 04/01/2016   GLUCOSE 99 04/01/2016   CHOL 187 04/01/2016   TRIG 255.0 (H) 04/01/2016   HDL 45.00 04/01/2016   LDLDIRECT 109.0 04/01/2016   LDLCALC 95 11/13/2014   ALT 18 04/01/2016   AST 17 04/01/2016   NA 138 04/01/2016   K 3.8 04/01/2016   CL 97 04/01/2016   CREATININE 0.67 04/01/2016   BUN 17 04/01/2016   CO2 33 (H) 04/01/2016   TSH 2.18 04/01/2016   INR 1.0 01/24/2009   HGBA1C 5.9 10/01/2015   Wt Readings from Last 3 Encounters:  04/08/16 253 lb 6.4 oz (114.9 kg)  01/30/16 251 lb 4.8 oz (114 kg)  01/02/16 257 lb (116.6 kg)    ASSESSMENT AND PLAN:  Discussed the following assessment and plan:  Visit for preventive health examination  Essential hypertension  Medication management  HLD (hyperlipidemia)  Thrombocytosis (HCC)  Elevated WBC count - minimal no sx nl diff follow WBC the elevated with normal differential continued mild thrombocytosis without obvious cause. Will follow. Repeat blood count in a couple months to ensure stability. Have pharmacy contact us electronically when refills needed no change in medication at this time. Adult counseling regarding diabetes nutrition  and exercise and well-being. Get flu shot when available. Patient Care Team: Burnis Medin, MD as PCP - General Arvella Nigh, MD (Obstetrics and Gynecology) Marybelle Killings, MD as Attending Physician (Orthopedic Surgery) Patient Instructions  Track intake  sleep and activityto help implement change   Perhaps a  Non processed food snack in afternoon may help . tyr to get   More sleep .  avoid late night meals snacking   Repeat blood count in 1-2 months to make sure stable .   Get outside  Some in day to help sleep and  Wellbeing  Flu vaccine when available   Health Maintenance, Female Adopting a healthy lifestyle and getting preventive care can go a long way to promote health and wellness. Talk with your health care provider about what schedule of regular examinations is right for you. This is a good chance for you to check in with your provider about disease prevention and staying healthy. In between checkups, there are plenty of things you can do on your own. Experts have done a lot of research about which lifestyle changes and preventive measures are most likely to keep you healthy. Ask your health care provider for more information. WEIGHT AND DIET  Eat a healthy diet  Be sure to include plenty of vegetables, fruits, low-fat dairy products, and lean protein.  Do not eat a lot of foods high in solid fats, added sugars, or salt.  Get regular exercise. This is one of the most important things you can do for your health.  Most adults should exercise for at least 150 minutes each week. The exercise should increase your heart rate and make you sweat (moderate-intensity exercise).  Most adults should also do strengthening exercises at least twice a week. This is in addition to the moderate-intensity exercise.  Maintain a healthy weight  Body mass index (BMI) is a measurement that can be used to identify possible weight problems. It estimates body fat based on height and weight. Your  health  care provider can help determine your BMI and help you achieve or maintain a healthy weight.  For females 53 years of age and older:   A BMI below 18.5 is considered underweight.  A BMI of 18.5 to 24.9 is normal.  A BMI of 25 to 29.9 is considered overweight.  A BMI of 30 and above is considered obese.  Watch levels of cholesterol and blood lipids  You should start having your blood tested for lipids and cholesterol at 53 years of age, then have this test every 5 years.  You may need to have your cholesterol levels checked more often if:  Your lipid or cholesterol levels are high.  You are older than 53 years of age.  You are at high risk for heart disease.  CANCER SCREENING   Lung Cancer  Lung cancer screening is recommended for adults 41-2 years old who are at high risk for lung cancer because of a history of smoking.  A yearly low-dose CT scan of the lungs is recommended for people who:  Currently smoke.  Have quit within the past 15 years.  Have at least a 30-pack-year history of smoking. A pack year is smoking an average of one pack of cigarettes a day for 1 year.  Yearly screening should continue until it has been 15 years since you quit.  Yearly screening should stop if you develop a health problem that would prevent you from having lung cancer treatment.  Breast Cancer  Practice breast self-awareness. This means understanding how your breasts normally appear and feel.  It also means doing regular breast self-exams. Let your health care provider know about any changes, no matter how small.  If you are in your 20s or 30s, you should have a clinical breast exam (CBE) by a health care provider every 1-3 years as part of a regular health exam.  If you are 63 or older, have a CBE every year. Also consider having a breast X-ray (mammogram) every year.  If you have a family history of breast cancer, talk to your health care provider about genetic  screening.  If you are at high risk for breast cancer, talk to your health care provider about having an MRI and a mammogram every year.  Breast cancer gene (BRCA) assessment is recommended for women who have family members with BRCA-related cancers. BRCA-related cancers include:  Breast.  Ovarian.  Tubal.  Peritoneal cancers.  Results of the assessment will determine the need for genetic counseling and BRCA1 and BRCA2 testing. Cervical Cancer Your health care provider may recommend that you be screened regularly for cancer of the pelvic organs (ovaries, uterus, and vagina). This screening involves a pelvic examination, including checking for microscopic changes to the surface of your cervix (Pap test). You may be encouraged to have this screening done every 3 years, beginning at age 68.  For women ages 86-65, health care providers may recommend pelvic exams and Pap testing every 3 years, or they may recommend the Pap and pelvic exam, combined with testing for human papilloma virus (HPV), every 5 years. Some types of HPV increase your risk of cervical cancer. Testing for HPV may also be done on women of any age with unclear Pap test results.  Other health care providers may not recommend any screening for nonpregnant women who are considered low risk for pelvic cancer and who do not have symptoms. Ask your health care provider if a screening pelvic exam is right for you.  If you  have had past treatment for cervical cancer or a condition that could lead to cancer, you need Pap tests and screening for cancer for at least 20 years after your treatment. If Pap tests have been discontinued, your risk factors (such as having a new sexual partner) need to be reassessed to determine if screening should resume. Some women have medical problems that increase the chance of getting cervical cancer. In these cases, your health care provider may recommend more frequent screening and Pap tests. Colorectal  Cancer  This type of cancer can be detected and often prevented.  Routine colorectal cancer screening usually begins at 53 years of age and continues through 53 years of age.  Your health care provider may recommend screening at an earlier age if you have risk factors for colon cancer.  Your health care provider may also recommend using home test kits to check for hidden blood in the stool.  A small camera at the end of a tube can be used to examine your colon directly (sigmoidoscopy or colonoscopy). This is done to check for the earliest forms of colorectal cancer.  Routine screening usually begins at age 10.  Direct examination of the colon should be repeated every 5-10 years through 53 years of age. However, you may need to be screened more often if early forms of precancerous polyps or small growths are found. Skin Cancer  Check your skin from head to toe regularly.  Tell your health care provider about any new moles or changes in moles, especially if there is a change in a mole's shape or color.  Also tell your health care provider if you have a mole that is larger than the size of a pencil eraser.  Always use sunscreen. Apply sunscreen liberally and repeatedly throughout the day.  Protect yourself by wearing long sleeves, pants, a wide-brimmed hat, and sunglasses whenever you are outside. HEART DISEASE, DIABETES, AND HIGH BLOOD PRESSURE   High blood pressure causes heart disease and increases the risk of stroke. High blood pressure is more likely to develop in:  People who have blood pressure in the high end of the normal range (130-139/85-89 mm Hg).  People who are overweight or obese.  People who are African American.  If you are 21-25 years of age, have your blood pressure checked every 3-5 years. If you are 110 years of age or older, have your blood pressure checked every year. You should have your blood pressure measured twice--once when you are at a hospital or clinic,  and once when you are not at a hospital or clinic. Record the average of the two measurements. To check your blood pressure when you are not at a hospital or clinic, you can use:  An automated blood pressure machine at a pharmacy.  A home blood pressure monitor.  If you are between 31 years and 53 years old, ask your health care provider if you should take aspirin to prevent strokes.  Have regular diabetes screenings. This involves taking a blood sample to check your fasting blood sugar level.  If you are at a normal weight and have a low risk for diabetes, have this test once every three years after 53 years of age.  If you are overweight and have a high risk for diabetes, consider being tested at a younger age or more often. PREVENTING INFECTION  Hepatitis B  If you have a higher risk for hepatitis B, you should be screened for this virus. You are considered at  high risk for hepatitis B if:  You were born in a country where hepatitis B is common. Ask your health care provider which countries are considered high risk.  Your parents were born in a high-risk country, and you have not been immunized against hepatitis B (hepatitis B vaccine).  You have HIV or AIDS.  You use needles to inject street drugs.  You live with someone who has hepatitis B.  You have had sex with someone who has hepatitis B.  You get hemodialysis treatment.  You take certain medicines for conditions, including cancer, organ transplantation, and autoimmune conditions. Hepatitis C  Blood testing is recommended for:  Everyone born from [birth year] through 1963.  Anyone with known risk factors for hepatitis C. Sexually transmitted infections (STIs)  You should be screened for sexually transmitted infections (STIs) including gonorrhea and chlamydia if:  You are sexually active and are younger than 53 years of age.  You are older than 53 years of age and your health care provider tells you that you are at risk  for this type of infection.  Your sexual activity has changed since you were last screened and you are at an increased risk for chlamydia or gonorrhea. Ask your health care provider if you are at risk.  If you do not have HIV, but are at risk, it may be recommended that you take a prescription medicine daily to prevent HIV infection. This is called pre-exposure prophylaxis (PrEP). You are considered at risk if:  You are sexually active and do not regularly use condoms or know the HIV status of your partner(s).  You take drugs by injection.  You are sexually active with a partner who has HIV. Talk with your health care provider about whether you are at high risk of being infected with HIV. If you choose to begin PrEP, you should first be tested for HIV. You should then be tested every 3 months for as long as you are taking PrEP.  PREGNANCY   If you are premenopausal and you may become pregnant, ask your health care provider about preconception counseling.  If you may become pregnant, take 400 to 800 micrograms (mcg) of folic acid every day.  If you want to prevent pregnancy, talk to your health care provider about birth control (contraception). OSTEOPOROSIS AND MENOPAUSE   Osteoporosis is a disease in which the bones lose minerals and strength with aging. This can result in serious bone fractures. Your risk for osteoporosis can be identified using a bone density scan.  If you are 24 years of age or older, or if you are at risk for osteoporosis and fractures, ask your health care provider if you should be screened.  Ask your health care provider whether you should take a calcium or vitamin D supplement to lower your risk for osteoporosis.  Menopause may have certain physical symptoms and risks.  Hormone replacement therapy may reduce some of these symptoms and risks. Talk to your health care provider about whether hormone replacement therapy is right for you.  HOME CARE INSTRUCTIONS    Schedule regular health, dental, and eye exams.  Stay current with your immunizations.   Do not use any tobacco products including cigarettes, chewing tobacco, or electronic cigarettes.  If you are pregnant, do not drink alcohol.  If you are breastfeeding, limit how much and how often you drink alcohol.  Limit alcohol intake to no more than 1 drink per day for nonpregnant women. One drink equals 12 ounces of beer,  5 ounces of wine, or 1 ounces of hard liquor.  Do not use street drugs.  Do not share needles.  Ask your health care provider for help if you need support or information about quitting drugs.  Tell your health care provider if you often feel depressed.  Tell your health care provider if you have ever been abused or do not feel safe at home.   This information is not intended to replace advice given to you by your health care provider. Make sure you discuss any questions you have with your health care provider.   Document Released: 02/17/2011 Document Revised: 08/25/2014 Document Reviewed: 07/06/2013 Elsevier Interactive Patient Education 2016 Devola K. Viha Kriegel M.D.

## 2016-04-08 ENCOUNTER — Encounter: Payer: Self-pay | Admitting: Internal Medicine

## 2016-04-08 ENCOUNTER — Ambulatory Visit (INDEPENDENT_AMBULATORY_CARE_PROVIDER_SITE_OTHER): Payer: 59 | Admitting: Internal Medicine

## 2016-04-08 VITALS — BP 136/86 | Temp 98.1°F | Ht 64.25 in | Wt 253.4 lb

## 2016-04-08 DIAGNOSIS — Z79899 Other long term (current) drug therapy: Secondary | ICD-10-CM | POA: Diagnosis not present

## 2016-04-08 DIAGNOSIS — Z0000 Encounter for general adult medical examination without abnormal findings: Secondary | ICD-10-CM | POA: Diagnosis not present

## 2016-04-08 DIAGNOSIS — I10 Essential (primary) hypertension: Secondary | ICD-10-CM

## 2016-04-08 DIAGNOSIS — E785 Hyperlipidemia, unspecified: Secondary | ICD-10-CM | POA: Diagnosis not present

## 2016-04-08 DIAGNOSIS — D473 Essential (hemorrhagic) thrombocythemia: Secondary | ICD-10-CM

## 2016-04-08 DIAGNOSIS — D75839 Thrombocytosis, unspecified: Secondary | ICD-10-CM

## 2016-04-08 DIAGNOSIS — D72829 Elevated white blood cell count, unspecified: Secondary | ICD-10-CM

## 2016-04-08 NOTE — Patient Instructions (Addendum)
Track intake  sleep and activityto help implement change   Perhaps a  Non processed food snack in afternoon may help . tyr to get   More sleep .  avoid late night meals snacking   Repeat blood count in 1-2 months to make sure stable .   Get outside  Some in day to help sleep and  Wellbeing  Flu vaccine when available   Health Maintenance, Female Adopting a healthy lifestyle and getting preventive care can go a long way to promote health and wellness. Talk with your health care provider about what schedule of regular examinations is right for you. This is a good chance for you to check in with your provider about disease prevention and staying healthy. In between checkups, there are plenty of things you can do on your own. Experts have done a lot of research about which lifestyle changes and preventive measures are most likely to keep you healthy. Ask your health care provider for more information. WEIGHT AND DIET  Eat a healthy diet  Be sure to include plenty of vegetables, fruits, low-fat dairy products, and lean protein.  Do not eat a lot of foods high in solid fats, added sugars, or salt.  Get regular exercise. This is one of the most important things you can do for your health.  Most adults should exercise for at least 150 minutes each week. The exercise should increase your heart rate and make you sweat (moderate-intensity exercise).  Most adults should also do strengthening exercises at least twice a week. This is in addition to the moderate-intensity exercise.  Maintain a healthy weight  Body mass index (BMI) is a measurement that can be used to identify possible weight problems. It estimates body fat based on height and weight. Your health care provider can help determine your BMI and help you achieve or maintain a healthy weight.  For females 25 years of age and older:   A BMI below 18.5 is considered underweight.  A BMI of 18.5 to 24.9 is normal.  A BMI of 25 to 29.9 is  considered overweight.  A BMI of 30 and above is considered obese.  Watch levels of cholesterol and blood lipids  You should start having your blood tested for lipids and cholesterol at 53 years of age, then have this test every 5 years.  You may need to have your cholesterol levels checked more often if:  Your lipid or cholesterol levels are high.  You are older than 53 years of age.  You are at high risk for heart disease.  CANCER SCREENING   Lung Cancer  Lung cancer screening is recommended for adults 23-32 years old who are at high risk for lung cancer because of a history of smoking.  A yearly low-dose CT scan of the lungs is recommended for people who:  Currently smoke.  Have quit within the past 15 years.  Have at least a 30-pack-year history of smoking. A pack year is smoking an average of one pack of cigarettes a day for 1 year.  Yearly screening should continue until it has been 15 years since you quit.  Yearly screening should stop if you develop a health problem that would prevent you from having lung cancer treatment.  Breast Cancer  Practice breast self-awareness. This means understanding how your breasts normally appear and feel.  It also means doing regular breast self-exams. Let your health care provider know about any changes, no matter how small.  If you are  in your 94s or 30s, you should have a clinical breast exam (CBE) by a health care provider every 1-3 years as part of a regular health exam.  If you are 66 or older, have a CBE every year. Also consider having a breast X-ray (mammogram) every year.  If you have a family history of breast cancer, talk to your health care provider about genetic screening.  If you are at high risk for breast cancer, talk to your health care provider about having an MRI and a mammogram every year.  Breast cancer gene (BRCA) assessment is recommended for women who have family members with BRCA-related cancers.  BRCA-related cancers include:  Breast.  Ovarian.  Tubal.  Peritoneal cancers.  Results of the assessment will determine the need for genetic counseling and BRCA1 and BRCA2 testing. Cervical Cancer Your health care provider may recommend that you be screened regularly for cancer of the pelvic organs (ovaries, uterus, and vagina). This screening involves a pelvic examination, including checking for microscopic changes to the surface of your cervix (Pap test). You may be encouraged to have this screening done every 3 years, beginning at age 20.  For women ages 80-65, health care providers may recommend pelvic exams and Pap testing every 3 years, or they may recommend the Pap and pelvic exam, combined with testing for human papilloma virus (HPV), every 5 years. Some types of HPV increase your risk of cervical cancer. Testing for HPV may also be done on women of any age with unclear Pap test results.  Other health care providers may not recommend any screening for nonpregnant women who are considered low risk for pelvic cancer and who do not have symptoms. Ask your health care provider if a screening pelvic exam is right for you.  If you have had past treatment for cervical cancer or a condition that could lead to cancer, you need Pap tests and screening for cancer for at least 20 years after your treatment. If Pap tests have been discontinued, your risk factors (such as having a new sexual partner) need to be reassessed to determine if screening should resume. Some women have medical problems that increase the chance of getting cervical cancer. In these cases, your health care provider may recommend more frequent screening and Pap tests. Colorectal Cancer  This type of cancer can be detected and often prevented.  Routine colorectal cancer screening usually begins at 53 years of age and continues through 53 years of age.  Your health care provider may recommend screening at an earlier age if you  have risk factors for colon cancer.  Your health care provider may also recommend using home test kits to check for hidden blood in the stool.  A small camera at the end of a tube can be used to examine your colon directly (sigmoidoscopy or colonoscopy). This is done to check for the earliest forms of colorectal cancer.  Routine screening usually begins at age 61.  Direct examination of the colon should be repeated every 5-10 years through 53 years of age. However, you may need to be screened more often if early forms of precancerous polyps or small growths are found. Skin Cancer  Check your skin from head to toe regularly.  Tell your health care provider about any new moles or changes in moles, especially if there is a change in a mole's shape or color.  Also tell your health care provider if you have a mole that is larger than the size of a  pencil eraser.  Always use sunscreen. Apply sunscreen liberally and repeatedly throughout the day.  Protect yourself by wearing long sleeves, pants, a wide-brimmed hat, and sunglasses whenever you are outside. HEART DISEASE, DIABETES, AND HIGH BLOOD PRESSURE   High blood pressure causes heart disease and increases the risk of stroke. High blood pressure is more likely to develop in:  People who have blood pressure in the high end of the normal range (130-139/85-89 mm Hg).  People who are overweight or obese.  People who are African American.  If you are 66-28 years of age, have your blood pressure checked every 3-5 years. If you are 73 years of age or older, have your blood pressure checked every year. You should have your blood pressure measured twice--once when you are at a hospital or clinic, and once when you are not at a hospital or clinic. Record the average of the two measurements. To check your blood pressure when you are not at a hospital or clinic, you can use:  An automated blood pressure machine at a pharmacy.  A home blood pressure  monitor.  If you are between 79 years and 39 years old, ask your health care provider if you should take aspirin to prevent strokes.  Have regular diabetes screenings. This involves taking a blood sample to check your fasting blood sugar level.  If you are at a normal weight and have a low risk for diabetes, have this test once every three years after 53 years of age.  If you are overweight and have a high risk for diabetes, consider being tested at a younger age or more often. PREVENTING INFECTION  Hepatitis B  If you have a higher risk for hepatitis B, you should be screened for this virus. You are considered at high risk for hepatitis B if:  You were born in a country where hepatitis B is common. Ask your health care provider which countries are considered high risk.  Your parents were born in a high-risk country, and you have not been immunized against hepatitis B (hepatitis B vaccine).  You have HIV or AIDS.  You use needles to inject street drugs.  You live with someone who has hepatitis B.  You have had sex with someone who has hepatitis B.  You get hemodialysis treatment.  You take certain medicines for conditions, including cancer, organ transplantation, and autoimmune conditions. Hepatitis C  Blood testing is recommended for:  Everyone born from [birth year] through 1963.  Anyone with known risk factors for hepatitis C. Sexually transmitted infections (STIs)  You should be screened for sexually transmitted infections (STIs) including gonorrhea and chlamydia if:  You are sexually active and are younger than 53 years of age.  You are older than 53 years of age and your health care provider tells you that you are at risk for this type of infection.  Your sexual activity has changed since you were last screened and you are at an increased risk for chlamydia or gonorrhea. Ask your health care provider if you are at risk.  If you do not have HIV, but are at risk, it may be  recommended that you take a prescription medicine daily to prevent HIV infection. This is called pre-exposure prophylaxis (PrEP). You are considered at risk if:  You are sexually active and do not regularly use condoms or know the HIV status of your partner(s).  You take drugs by injection.  You are sexually active with a partner who has HIV. Talk  with your health care provider about whether you are at high risk of being infected with HIV. If you choose to begin PrEP, you should first be tested for HIV. You should then be tested every 3 months for as long as you are taking PrEP.  PREGNANCY   If you are premenopausal and you may become pregnant, ask your health care provider about preconception counseling.  If you may become pregnant, take 400 to 800 micrograms (mcg) of folic acid every day.  If you want to prevent pregnancy, talk to your health care provider about birth control (contraception). OSTEOPOROSIS AND MENOPAUSE   Osteoporosis is a disease in which the bones lose minerals and strength with aging. This can result in serious bone fractures. Your risk for osteoporosis can be identified using a bone density scan.  If you are 22 years of age or older, or if you are at risk for osteoporosis and fractures, ask your health care provider if you should be screened.  Ask your health care provider whether you should take a calcium or vitamin D supplement to lower your risk for osteoporosis.  Menopause may have certain physical symptoms and risks.  Hormone replacement therapy may reduce some of these symptoms and risks. Talk to your health care provider about whether hormone replacement therapy is right for you.  HOME CARE INSTRUCTIONS   Schedule regular health, dental, and eye exams.  Stay current with your immunizations.   Do not use any tobacco products including cigarettes, chewing tobacco, or electronic cigarettes.  If you are pregnant, do not drink alcohol.  If you are  breastfeeding, limit how much and how often you drink alcohol.  Limit alcohol intake to no more than 1 drink per day for nonpregnant women. One drink equals 12 ounces of beer, 5 ounces of wine, or 1 ounces of hard liquor.  Do not use street drugs.  Do not share needles.  Ask your health care provider for help if you need support or information about quitting drugs.  Tell your health care provider if you often feel depressed.  Tell your health care provider if you have ever been abused or do not feel safe at home.   This information is not intended to replace advice given to you by your health care provider. Make sure you discuss any questions you have with your health care provider.   Document Released: 02/17/2011 Document Revised: 08/25/2014 Document Reviewed: 07/06/2013 Elsevier Interactive Patient Education [employer].

## 2016-04-25 ENCOUNTER — Other Ambulatory Visit: Payer: Self-pay | Admitting: Internal Medicine

## 2016-04-28 NOTE — Telephone Encounter (Signed)
Pt scheduled for 04/10/17 for cpx.  Has upcoming lab appt scheduled for 05/19/16 for follow up labs per [facility].

## 2016-05-07 ENCOUNTER — Other Ambulatory Visit (HOSPITAL_BASED_OUTPATIENT_CLINIC_OR_DEPARTMENT_OTHER): Payer: 59

## 2016-05-07 ENCOUNTER — Ambulatory Visit (HOSPITAL_BASED_OUTPATIENT_CLINIC_OR_DEPARTMENT_OTHER): Payer: 59 | Admitting: Family

## 2016-05-07 ENCOUNTER — Encounter: Payer: Self-pay | Admitting: Family

## 2016-05-07 VITALS — BP 141/77 | HR 75 | Temp 97.9°F | Resp 16 | Ht 64.25 in | Wt 247.1 lb

## 2016-05-07 DIAGNOSIS — D473 Essential (hemorrhagic) thrombocythemia: Secondary | ICD-10-CM

## 2016-05-07 DIAGNOSIS — D75839 Thrombocytosis, unspecified: Secondary | ICD-10-CM

## 2016-05-07 DIAGNOSIS — D509 Iron deficiency anemia, unspecified: Secondary | ICD-10-CM

## 2016-05-07 LAB — COMPREHENSIVE METABOLIC PANEL
ALT: 25 U/L (ref 0–55)
AST: 19 U/L (ref 5–34)
Albumin: 3.9 g/dL (ref 3.5–5.0)
Alkaline Phosphatase: 73 U/L (ref 40–150)
Anion Gap: 11 mEq/L (ref 3–11)
BUN: 26 mg/dL (ref 7.0–26.0)
CHLORIDE: 99 meq/L (ref 98–109)
CO2: 30 meq/L — AB (ref 22–29)
Calcium: 9.6 mg/dL (ref 8.4–10.4)
Creatinine: 0.8 mg/dL (ref 0.6–1.1)
EGFR: 88 mL/min/{1.73_m2} — ABNORMAL LOW (ref >90)
Glucose: 92 mg/dl (ref 70–140)
POTASSIUM: 4.2 meq/L (ref 3.5–5.1)
Sodium: 140 mEq/L (ref 136–145)
Total Bilirubin: 0.68 mg/dL (ref 0.20–1.20)
Total Protein: 8.1 g/dL (ref 6.4–8.3)

## 2016-05-07 LAB — CBC WITH DIFFERENTIAL (CANCER CENTER ONLY)
BASO#: 0.1 10*3/uL (ref 0.0–0.2)
BASO%: 0.6 % (ref 0.0–2.0)
EOS%: 6.4 % (ref 0.0–7.0)
Eosinophils Absolute: 0.6 10*3/uL — ABNORMAL HIGH (ref 0.0–0.5)
HCT: 42.2 % (ref 34.8–46.6)
HGB: 14.2 g/dL (ref 11.6–15.9)
LYMPH#: 2.2 10*3/uL (ref 0.9–3.3)
LYMPH%: 22.6 % (ref 14.0–48.0)
MCH: 29.5 pg (ref 26.0–34.0)
MCHC: 33.6 g/dL (ref 32.0–36.0)
MCV: 88 fL (ref 81–101)
MONO#: 0.5 10*3/uL (ref 0.1–0.9)
MONO%: 5.3 % (ref 0.0–13.0)
NEUT#: 6.5 10*3/uL (ref 1.5–6.5)
NEUT%: 65.1 % (ref 39.6–80.0)
PLATELETS: 475 10*3/uL — AB (ref 145–400)
RBC: 4.82 10*6/uL (ref 3.70–5.32)
RDW: 13.4 % (ref 11.1–15.7)
WBC: 9.9 10*3/uL (ref 3.9–10.0)

## 2016-05-07 LAB — IRON AND TIBC
%SAT: 23 % (ref 21–57)
Iron: 69 ug/dL (ref 41–142)
TIBC: 304 ug/dL (ref 236–444)
UIBC: 234 ug/dL (ref 120–384)

## 2016-05-07 LAB — CHCC SATELLITE - SMEAR

## 2016-05-07 LAB — FERRITIN: FERRITIN: 129 ng/mL (ref 9–269)

## 2016-05-07 NOTE — Progress Notes (Signed)
Hematology and Oncology Follow Up Visit  LATASIA DESNOYERS [id number] 12/19/1960 55 y.o. 05/07/2016   Principle Diagnosis:  Thrombocytosis Iron deficiency anemia  Current Therapy:   IV iron as indicated - last received in May 2017 Aspirin 81 mg by mouth every other day    Interim History:  Ms. Thomassie is here today for follow-up. She has bronchitis with a productive cough and is on Keflex PO. Otherwise, she is doing quite well. She received Feraheme in May for an iron saturation if 15%. Hgb is stable at 14.2 with an MCV of 88.  Platelet count is 475. She continues to take her 1 baby aspirin every other day.  No fever, chills, n/v, rash, dizziness, SOB, chest pain, palpitations, abdominal pain or changes in bowel or bladder habits.  No episodes of bleeding, bruising or petechiae. No lymphadenopathy found on exam.  No swelling, tenderness, numbness or tingling in her extremities. No c/o joint aches or pain.  She has a good appetite and is staying well hydrated. Her weight is stable.   Medications:    Medication List       Accurate as of 05/07/16  9:56 AM. Always use your most recent med list.          ADVAIR DISKUS 100-50 MCG/DOSE Aepb Generic drug:  Fluticasone-Salmeterol USE ONE INHALATION INTO THE LUNGS TWO TIMES A DAY OR AS DIRECTED   albuterol 108 (90 Base) MCG/ACT inhaler Commonly known as:  PROVENTIL HFA;VENTOLIN HFA Inhale 2 puffs into the lungs every 6 (six) hours as needed for wheezing.   albuterol (2.5 MG/3ML) 0.083% nebulizer solution Commonly known as:  PROVENTIL Take 3 mLs (2.5 mg total) by nebulization every 6 (six) hours as needed for wheezing or shortness of breath.   Calcium Carbonate-Vitamin D 600-400 MG-UNIT chew tablet Chew 1 tablet by mouth daily.   CINNAMON PO Take 1,000 Units by mouth every morning.   fluticasone 50 MCG/ACT nasal spray Commonly known as:  FLONASE Place 2 sprays into both nostrils daily. 2 each nostril   losartan-hydrochlorothiazide  50-12.5 MG tablet Commonly known as:  HYZAAR TAKE 1 TABLET DAILY   metoprolol succinate 50 MG 24 hr tablet Commonly known as:  TOPROL-XL TAKE 1 TABLET DAILY. TAKE  WITH OR IMMEDIATELY        FOLLOWING A MEAL.   MULTI-VITAMIN PO Take 1 tablet by mouth daily.   vitamin E 400 UNIT capsule Take 400 Units by mouth daily.       Allergies:  Allergies  Allergen Reactions  . Codeine Anaphylaxis    Hallucinations  . Ace Inhibitors Cough    Past Medical History, Surgical history, Social history, and Family History were reviewed and updated.  Review of Systems: All other 10 point review of systems is negative.   Physical Exam:  height is 5' 4.25" (1.632 m) and weight is 247 lb 1.9 oz (112.1 kg). Her oral temperature is 97.9 F (36.6 C). Her blood pressure is 141/77 (abnormal) and her pulse is 75. Her respiration is 16.   Wt Readings from Last 3 Encounters:  05/07/16 247 lb 1.9 oz (112.1 kg)  04/08/16 253 lb 6.4 oz (114.9 kg)  01/30/16 251 lb 4.8 oz (114 kg)    Ocular: Sclerae unicteric, pupils equal, round and reactive to light Ear-nose-throat: Oropharynx clear, dentition fair Lymphatic: No cervical supraclavicular or axillary adenopathy Lungs no rales or rhonchi, good excursion bilaterally Heart regular rate and rhythm, no murmur appreciated Abd soft, nontender, positive bowel sounds, no liver or  spleen tip palpated on exam, no fluid wave MSK no focal spinal tenderness, no joint edema Neuro: non-focal, well-oriented, appropriate affect Breasts: Deferred  Lab Results  Component Value Date   WBC 9.9 05/07/2016   HGB 14.2 05/07/2016   HCT 42.2 05/07/2016   MCV 88 05/07/2016   PLT 475 (H) 05/07/2016   Lab Results  Component Value Date   FERRITIN 37 01/02/2016   IRON 48 01/02/2016   TIBC 321 01/02/2016   UIBC 273 01/02/2016   IRONPCTSAT 15 (L) 01/02/2016   Lab Results  Component Value Date   RBC 4.82 05/07/2016   No results found for: KPAFRELGTCHN, LAMBDASER,  KAPLAMBRATIO No results found for: IGGSERUM, IGA, IGMSERUM No results found for: Odetta Pink, SPEI   Chemistry      Component Value Date/Time   NA 138 04/01/2016 0818   NA 140 01/02/2016 0929   K 3.8 04/01/2016 0818   K 4.1 01/02/2016 0929   CL 97 04/01/2016 0818   CO2 33 (H) 04/01/2016 0818   CO2 32 (H) 01/02/2016 0929   BUN 17 04/01/2016 0818   BUN 20.4 01/02/2016 0929   CREATININE 0.67 04/01/2016 0818   CREATININE 0.8 01/02/2016 0929      Component Value Date/Time   CALCIUM 9.6 04/01/2016 0818   CALCIUM 9.5 01/02/2016 0929   ALKPHOS 52 04/01/2016 0818   ALKPHOS 55 01/02/2016 0929   AST 17 04/01/2016 0818   AST 16 01/02/2016 0929   ALT 18 04/01/2016 0818   ALT 20 01/02/2016 0929   BILITOT 0.6 04/01/2016 0818   BILITOT 0.64 01/02/2016 0929     Impression and Plan: Ms. Watson is 55 year old white female with iron deficiency anemia and reactive thrombocytosis. She is asymptomatic at this time.  Platelet count is 475. She will continue on her 1 baby aspirin daily.  We will see what her iron studies show and bring her in later this week for an infusion if needed. We will plan to see her back in 4 months for labs and follow-up.  She will contact us with any questions or concerns. We can certainly see her sooner if need be.    Eliezer Bottom, NP 9/20/20179:56 AM

## 2016-05-08 ENCOUNTER — Encounter: Payer: Self-pay | Admitting: *Deleted

## 2016-05-08 LAB — RETICULOCYTES: Reticulocyte Count: 1.8 % (ref 0.6–2.6)

## 2016-05-19 ENCOUNTER — Other Ambulatory Visit (INDEPENDENT_AMBULATORY_CARE_PROVIDER_SITE_OTHER): Payer: 59

## 2016-05-19 DIAGNOSIS — D539 Nutritional anemia, unspecified: Secondary | ICD-10-CM | POA: Diagnosis not present

## 2016-05-19 LAB — CBC WITH DIFFERENTIAL/PLATELET
BASOS PCT: 0.6 % (ref 0.0–3.0)
Basophils Absolute: 0.1 10*3/uL (ref 0.0–0.1)
EOS ABS: 0.4 10*3/uL (ref 0.0–0.7)
EOS PCT: 5.1 % — AB (ref 0.0–5.0)
HCT: 38.8 % (ref 36.0–46.0)
HEMOGLOBIN: 13 g/dL (ref 12.0–15.0)
LYMPHS ABS: 2.1 10*3/uL (ref 0.7–4.0)
Lymphocytes Relative: 24.1 % (ref 12.0–46.0)
MCHC: 33.5 g/dL (ref 30.0–36.0)
MCV: 86.1 fl (ref 78.0–100.0)
MONO ABS: 0.4 10*3/uL (ref 0.1–1.0)
Monocytes Relative: 4.9 % (ref 3.0–12.0)
NEUTROS ABS: 5.8 10*3/uL (ref 1.4–7.7)
Neutrophils Relative %: 65.3 % (ref 43.0–77.0)
Platelets: 482 10*3/uL — ABNORMAL HIGH (ref 150.0–400.0)
RBC: 4.51 Mil/uL (ref 3.87–5.11)
RDW: 13.8 % (ref 11.5–15.5)
WBC: 8.8 10*3/uL (ref 4.0–10.5)

## 2016-05-30 ENCOUNTER — Other Ambulatory Visit: Payer: Self-pay | Admitting: Internal Medicine

## 2016-06-03 NOTE — Telephone Encounter (Signed)
Sent to the pharmacy by e-scribe.  Pt has cpx scheduled for 04/10/16.

## 2016-09-10 ENCOUNTER — Ambulatory Visit: Payer: 59 | Admitting: Family

## 2016-09-10 ENCOUNTER — Other Ambulatory Visit: Payer: 59

## 2016-10-24 ENCOUNTER — Telehealth: Payer: Self-pay | Admitting: Internal Medicine

## 2016-10-24 NOTE — Telephone Encounter (Signed)
Pt state that she has a pinch nerve and would like to have prednisone.  Pt refuse appointment but is aware that she may need to come in to be evaluated.  Pharm:  [facility].

## 2016-10-24 NOTE — Telephone Encounter (Signed)
Spoke with pt to get more information about the pinch nerve, She states that she has a Degenerative Disc, her left arm she cant move and her fingers on her left hand are numb. Dr. Regis Bill states that she would have to see patient first before prescribing anything so recommend her go to the Saturday clinic. Pt agreed

## 2016-10-25 ENCOUNTER — Ambulatory Visit (INDEPENDENT_AMBULATORY_CARE_PROVIDER_SITE_OTHER): Payer: 59 | Admitting: Family Medicine

## 2016-10-25 ENCOUNTER — Encounter: Payer: Self-pay | Admitting: Family Medicine

## 2016-10-25 DIAGNOSIS — M5412 Radiculopathy, cervical region: Secondary | ICD-10-CM | POA: Diagnosis not present

## 2016-10-25 MED ORDER — PREDNISONE 10 MG PO TABS
ORAL_TABLET | ORAL | 0 refills | Status: DC
Start: 2016-10-25 — End: 2017-04-10

## 2016-10-25 MED ORDER — PREDNISONE 10 MG PO TABS: ORAL_TABLET | ORAL | 0 refills | Status: DC

## 2016-10-25 NOTE — Progress Notes (Signed)
Subjective:  Patient ID: Danielle Martin, female    DOB: [birth year]  Age: 56 y.o. MRN: [id number]  CC: Pinched nerve  HPI:  56 year old female with a history of degenerative disc disease status post cervical fusion presents with the above complaint.  Patient reports that she has recently been experiencing left arm pain and numbness/tingling in her ring and middle fingers. She reports pain is moderate to severe. No reported weakness. No recent fall, trauma, injury. No other associated symptoms. No other complaints or concerns at this time.  Social Hx   Social History   Social History  . Marital status: Married    Spouse name: N/A  . Number of children: N/A  . Years of education: N/A   Social History Main Topics  . Smoking status: Former Smoker    Packs/day: 0.12    Years: 3.00    Types: Cigarettes  . Smokeless tobacco: Never Used     Comment: "stopped smoking in the 1980s"  . Alcohol use 4.8 oz/week    4 Glasses of wine, 4 Shots of liquor per week  . Drug use: No  . Sexual activity: Yes   Other Topics Concern  . 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  Neurological: Positive for numbness.   Objective:  BP 128/84   Pulse 85   Temp 98.3 F (36.8 C) (Oral)   Wt 256 lb (116.1 kg)   SpO2 97%   BMI 43.60 kg/m   BP/Weight 10/25/2016 05/07/2016 3/78/5885  Systolic BP 027 741 287  Diastolic BP 84 77 86  Wt. (Lbs) 256 247.12 253.4  BMI 43.6 42.09 43.16   Physical Exam  Constitutional: She is oriented to person, place, and time. She appears well-developed. No distress.  Pulmonary/Chest: Effort normal.  Musculoskeletal:  Slightly decreased range of motion of cervical spine. Normal hand grip strength. Patient  endorses pain with range of motion of her left arm.  Neurological: She is alert and oriented to person, place, and time.  Psychiatric: She has a normal mood and affect.  Vitals reviewed.   Lab Results  Component Value Date   WBC 8.8 05/19/2016   HGB 13.0 05/19/2016   HCT 38.8 05/19/2016   PLT 482.0 (H) 05/19/2016   GLUCOSE 92 05/07/2016   CHOL 187 04/01/2016   TRIG 255.0 (H) 04/01/2016   HDL 45.00 04/01/2016   LDLDIRECT 109.0 04/01/2016   LDLCALC 95 11/13/2014   ALT 25 05/07/2016   AST 19 05/07/2016   NA 140 05/07/2016   K 4.2 05/07/2016   CL 97 04/01/2016   CREATININE 0.8 05/07/2016   BUN 26.0 05/07/2016   CO2 30 (H) 05/07/2016   TSH 2.18 04/01/2016   INR 1.0 01/24/2009   HGBA1C 5.9 10/01/2015    Assessment & Plan:   Problem List Items Addressed This Visit    Cervical radiculopathy    New problem. Treating with Prednisone taper.         Meds ordered this encounter  Medications  . predniSONE (DELTASONE) 10 MG tablet    Sig: 6 tablets daily 2 days, 5 tablets daily  2 days, 4 tablets daily 2 days, 3 tablets daily 2 days, 2 tablets daily 2 days, 1 tablet daily 2 days    Dispense:  42 tablet    Refill:  0    Follow-up: PRN  [place]

## 2016-10-25 NOTE — Assessment & Plan Note (Signed)
New problem. Treating with Prednisone taper.

## 2016-10-25 NOTE — Progress Notes (Signed)
Pre visit review using our clinic review tool, if applicable. No additional management support is needed unless otherwise documented below in the visit note. 

## 2016-10-25 NOTE — Patient Instructions (Signed)
Medication as prescribed.  Take care  Dr. Ercell Razon  

## 2017-02-20 ENCOUNTER — Other Ambulatory Visit: Payer: Self-pay | Admitting: Internal Medicine

## 2017-03-06 DIAGNOSIS — Z0100 Encounter for examination of eyes and vision without abnormal findings: Secondary | ICD-10-CM | POA: Diagnosis not present

## 2017-04-06 ENCOUNTER — Other Ambulatory Visit: Payer: 59

## 2017-04-09 NOTE — Progress Notes (Signed)
Chief Complaint  Patient presents with  . Annual Exam    HPI: Patient  Danielle Martin  56 y.o. comes in today for Preventive Health Care visit  And Chronic disease management  Asthma doing ok advair   Seasonal .  No flare Bp is good  No se of med  Does get am dry  Cough at times . 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 MAMMOGRAM  12/24/2016  . INFLUENZA VACCINE  03/18/2017  . HIV Screening  04/10/2018 (Originally 02/22/1976)  . TETANUS/TDAP  12/24/2020  . COLONOSCOPY  02/19/2021  . Hepatitis C Screening  Completed   Health Maintenance Review LIFESTYLE:  Exercise:  no Tobacco/ETS: no Alcohol:  ocass  Sugar beverages:no Sleep: 6 +     Drug use: no HH of  2 no pets  Work:  50  hours Sitting.   Tries to get up  [place].     ROS:  GEN/ HEENT: No fever, significant weight changes sweats headaches vision problems hearing changes, CV/ PULM; No chest pain shortness of breath cough, syncope,edema  change in exercise tolerance. GI /GU: No adominal pain, vomiting, change in bowel habits. No blood in the stool. No significant GU symptoms. SKIN/HEME: ,no acute skin rashes suspicious lesions or bleeding. No lymphadenopathy, nodules, masses.  NEURO/ PSYCH:  No neurologic signs such as weakness numbness. No depression anxiety. IMM/ Allergy: No unusual infections.  Allergy .   REST of 12 system review negative except as per HPI   Past Medical History:  Diagnosis Date  . Allergic rhinitis    Worse in winter , grasses  . Asthma    Worse in winter  . Gall stone pancreatitis    lap choley 1 2017  . GERD (gastroesophageal reflux disease)   . FIEPPIRJ(188.4)    "bi-weekly" (09/03/2015)  . HEMORRHAGIC CYSTITIS 05/25/2009   Qualifier: Diagnosis of  By: Regis Bill MD, Standley Brooking   . Hypertension   . Iron deficiency anemia 05/01/2015  .  Migraine    "q couple months" (09/03/2015)  . Pneumonia    "a few times" (09/03/2015)  . SVT (supraventricular tachycardia) (HCC)    No longer a problem    Past Surgical History:  Procedure Laterality Date  . ANTERIOR CERVICAL DECOMP/DISCECTOMY FUSION  2008  . BACK SURGERY    . CESAREAN SECTION  1990; 1991  . CHOLECYSTECTOMY N/A 09/04/2015   Procedure: LAPAROSCOPIC CHOLECYSTECTOMY WITH INTRAOPERATIVE CHOLANGIOGRAM;  Surgeon: Greer Pickerel, MD;  Location: [facility];  Service: General;  Laterality: N/A;  . OOPHORECTOMY  1980s   "all of the right; part of the left"  . VAGINAL HYSTERECTOMY  2010    Family History  Problem Relation Age of Onset  . Other Mother        Died of asphyxiation  . Prostate cancer Father     Social History   Social History  . Marital status: Married    Spouse name: N/A  . Number of children: N/A  . Years of education: N/A   Social History Main Topics  . Smoking status: Former Smoker    Packs/day: 0.12    Years: 3.00    Types: Cigarettes  . Smokeless tobacco: Never Used     Comment: "stopped smoking in the 1980s"  . Alcohol use  4.8 oz/week    4 Glasses of wine, 4 Shots of liquor per week  . Drug use: No  . Sexual activity: Yes   Other Topics Concern  . 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 ADVAIR DISKUS 100-50 MCG/DOSE AEPB USE ONE INHALATION INTO THE LUNGS TWO TIMES A DAY OR AS DIRECTED (Patient taking differently: USE ONE INHALATION INTO THE LUNGS TWO TIMES A DAY as needed) 3 each 1  . albuterol (PROVENTIL HFA;VENTOLIN HFA) 108 (90 BASE) MCG/ACT inhaler Inhale 2 puffs into the lungs every 6 (six) hours as needed for wheezing.    Marland Kitchen albuterol (PROVENTIL) (2.5 MG/3ML)  0.083% nebulizer solution Take 3 mLs (2.5 mg total) by nebulization every 6 (six) hours as needed for wheezing or shortness of breath. 150 mL 1  . Calcium Carbonate-Vitamin D 600-400 MG-UNIT per chew tablet Chew 1 tablet by mouth daily.    Marland Kitchen CINNAMON PO Take 1,000 Units by mouth every morning.    Marland Kitchen losartan-hydrochlorothiazide (HYZAAR) 50-12.5 MG tablet TAKE 1 TABLET DAILY 90 tablet 1  . metoprolol succinate (TOPROL-XL) 50 MG 24 hr tablet TAKE 1 TABLET DAILY. TAKE  WITH OR IMMEDIATELY        FOLLOWING A MEAL. 90 tablet 3  . Multiple Vitamin (MULTI-VITAMIN PO) Take 1 tablet by mouth daily.     . vitamin E 400 UNIT capsule Take 400 Units by mouth daily.    . predniSONE (DELTASONE) 10 MG tablet 6 tablets daily 2 days, 5 tablets daily 2 days, 4 tablets daily 2 days, 3 tablets daily 2 days, 2 tablets daily 2 days, 1 tablet daily 2 days 42 tablet 0  . fluticasone (FLONASE) 50 MCG/ACT nasal spray Place 2 sprays into both nostrils daily. 2 each nostril 48 g 3   No facility-administered medications prior to visit.      EXAM:  BP 124/80 (BP Location: Right Arm, Patient Position: Sitting, Cuff Size: Large)   Pulse 76   Temp 98.3 F (36.8 C) (Oral)   Ht 5' 3.75" (1.619 m)   Wt 255 lb 12.8 oz (116 kg)   BMI 44.25 kg/m   Body mass index is 44.25 kg/m. Wt Readings from Last 3 Encounters:  04/10/17 255 lb 12.8 oz (116 kg)  10/25/16 256 lb (116.1 kg)  05/07/16 247 lb 1.9 oz (112.1 kg)    Physical Exam: Vital signs reviewed [id number] is a well-developed well-nourished alert cooperative    who appearsr stated age in no acute distress.  HEENT: normocephalic atraumatic , Eyes: PERRL EOM's full, conjunctiva clear, Nares: paten,t no deformity discharge or tenderness., Ears: no deformity EAC's clear TMs with normal landmarks. Mouth: clear OP, no lesions, edema.  Moist mucous membranes. Dentition in adequate repair. NECK: supple without masses, thyromegaly or bruits. CHEST/PULM:  Clear to  auscultation and percussion breath sounds equal no wheeze , rales or rhonchi. No chest wall deformities or tenderness. Breast: normal by inspection . No dimpling, discharge, masses, tenderness or discharge . CV: PMI is nondisplaced, S1 S2 no gallops, murmurs, rubs. Peripheral pulses are full without delay.No JVD .  ABDOMEN: Bowel sounds normal nontender  No guard or rebound, no hepato splenomegal no CVA tenderness.  No hernia. Extremtities:  No clubbing cyanosis or edema, no acute joint swelling or redness no focal atrophy NEURO:  Oriented x3, cranial nerves 3-12 appear to be intact, no obvious focal weakness,gait within normal limits no abnormal reflexes or asymmetrical SKIN: No acute rashes normal turgor, color, no bruising or petechiae. PSYCH: Oriented, good eye contact, no obvious depression anxiety, cognition and judgment appear normal. LN: no cervical axillary inguinal adenopathy  Lab Results  Component Value Date   WBC 8.8 05/19/2016   HGB 13.0 05/19/2016   HCT 38.8 05/19/2016   PLT 482.0 (H) 05/19/2016   GLUCOSE 92 05/07/2016   CHOL 187 04/01/2016   TRIG 255.0 (H) 04/01/2016   HDL 45.00 04/01/2016   LDLDIRECT 109.0 04/01/2016   LDLCALC 95 11/13/2014   ALT 25 05/07/2016   AST 19 05/07/2016   NA 140 05/07/2016   K 4.2 05/07/2016   CL 97 04/01/2016   CREATININE 0.8 05/07/2016   BUN 26.0 05/07/2016   CO2 30 (H) 05/07/2016   TSH 2.18 04/01/2016   INR 1.0 01/24/2009   HGBA1C 5.9 10/01/2015    BP Readings from Last 3 Encounters:  04/10/17 124/80  10/25/16 128/84  05/07/16 (!) 141/77    Lab results reviewed with patient   ASSESSMENT AND PLAN:  Discussed the following assessment and plan:  Visit for preventive health examination - Plan: Basic metabolic panel, CBC with Differential/Platelet, Hepatic function panel, Lipid panel, TSH  Essential hypertension - controlled - Plan: Basic metabolic panel, CBC with Differential/Platelet, Hepatic function panel, Lipid panel,  TSH  Medication management - Plan: Basic metabolic panel, CBC with Differential/Platelet, Hepatic function panel, Lipid panel, TSH  Hyperlipidemia, unspecified hyperlipidemia type - Plan: Basic metabolic panel, CBC with Differential/Platelet, Hepatic function panel, Lipid panel, TSH  Thrombocytosis (HCC) - minotoring felt from iron defic per hem - Plan: Basic metabolic panel, CBC with Differential/Platelet, Hepatic function panel, Lipid panel, TSH  Hx of extrinsic asthma - controlled at this time - Plan: Basic metabolic panel, CBC with Differential/Platelet, Hepatic function panel, Lipid panel, TSH  BMI 40.0-44.9, adult (HCC) Lab needed  Today and plan fu depending   Advice given  Patient Care Team: Burnis Medin, MD as PCP - General Arvella Nigh, MD (Obstetrics and Gynecology) Marybelle Killings, MD as Attending Physician (Orthopedic Surgery) Patient Instructions   Glad your asthma is stableand blood pressure is controlled. 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 Avoiding simple carbohydrates processed foods are helpful not eating late at night and trying to get enough sleep. If you think you are having symptoms of sleep apnea contact us for consult referral.  We'll let you know lab results when they're available follow-up yearly if all is well.   Check into shingles vaccine. Shingrix  Can receive either in the office or pharmacy depending on insurance rules.       Preventive Care 40-64 Years, Female Preventive care refers to lifestyle choices and visits with your health care provider that can promote health and wellness. What does preventive care include?  A yearly physical exam. This is also called an annual well check.  Dental exams once or twice a year.  Routine eye exams. Ask your health care provider how often you should have your eyes checked.  Personal lifestyle choices, including: ? Daily care of your teeth and gums. ? Regular  physical activity. ? Eating a  healthy diet. ? Avoiding tobacco and drug use. ? Limiting alcohol use. ? Practicing safe sex. ? Taking low-dose aspirin daily starting at age 49. ? Taking vitamin and mineral supplements as recommended by your health care provider. What happens during an annual well check? The services and screenings done by your health care provider during your annual well check will depend on your age, overall health, lifestyle risk factors, and family history of disease. Counseling Your health care provider may ask you questions about your:  Alcohol use.  Tobacco use.  Drug use.  Emotional well-being.  Home and relationship well-being.  Sexual activity.  Eating habits.  Work and work Statistician.  Method of birth control.  Menstrual cycle.  Pregnancy history.  Screening You may have the following tests or measurements:  Height, weight, and BMI.  Blood pressure.  Lipid and cholesterol levels. These may be checked every 5 years, or more frequently if you are over 39 years old.  Skin check.  Lung cancer screening. You may have this screening every year starting at age 3 if you have a 30-pack-year history of smoking and currently smoke or have quit within the past 15 years.  Fecal occult blood test (FOBT) of the stool. You may have this test every year starting at age 58.  Flexible sigmoidoscopy or colonoscopy. You may have a sigmoidoscopy every 5 years or a colonoscopy every 10 years starting at age 87.  Hepatitis C blood test.  Hepatitis B blood test.  Sexually transmitted disease (STD) testing.  Diabetes screening. This is done by checking your blood sugar (glucose) after you have not eaten for a while (fasting). You may have this done every 1-3 years.  Mammogram. This may be done every 1-2 years. Talk to your health care provider about when you should start having regular mammograms. This may depend on whether you have a family history of  breast cancer.  BRCA-related cancer screening. This may be done if you have a family history of breast, ovarian, tubal, or peritoneal cancers.  Pelvic exam and Pap test. This may be done every 3 years starting at age 64. Starting at age 63, this may be done every 5 years if you have a Pap test in combination with an HPV test.  Bone density scan. This is done to screen for osteoporosis. You may have this scan if you are at high risk for osteoporosis.  Discuss your test results, treatment options, and if necessary, the need for more tests with your health care provider. Vaccines Your health care provider may recommend certain vaccines, such as:  Influenza vaccine. This is recommended every year.  Tetanus, diphtheria, and acellular pertussis (Tdap, Td) vaccine. You may need a Td booster every 10 years.  Varicella vaccine. You may need this if you have not been vaccinated.  Zoster vaccine. You may need this after age 78.  Measles, mumps, and rubella (MMR) vaccine. You may need at least one dose of MMR if you were born in 1957 or later. You may also need a second dose.  Pneumococcal 13-valent conjugate (PCV13) vaccine. You may need this if you have certain conditions and were not previously vaccinated.  Pneumococcal polysaccharide (PPSV23) vaccine. You may need one or two doses if you smoke cigarettes or if you have certain conditions.  Meningococcal vaccine. You may need this if you have certain conditions.  Hepatitis A vaccine. You may need this if you have certain conditions or if you travel or work in places where you  may be exposed to hepatitis A.  Hepatitis B vaccine. You may need this if you have certain conditions or if you travel or work in places where you may be exposed to hepatitis B.  Haemophilus influenzae type b (Hib) vaccine. You may need this if you have certain conditions.  Talk to your health care provider about which screenings and vaccines you need and how often you  need them. This information is not intended to replace advice given to you by your health care provider. Make sure you discuss any questions you have with your health care provider. Document Released: 08/31/2015 Document Revised: 04/23/2016 Document Reviewed: 06/05/2015     Why follow it? Research shows. . Those who follow the Mediterranean diet have a reduced risk of heart disease  . The diet is associated with a reduced incidence of Parkinson's and Alzheimer's diseases . People following the diet may have longer life expectancies and lower rates of chronic diseases  . The Dietary Guidelines for Americans recommends the Mediterranean diet as an eating plan to promote health and prevent disease  What Is the Mediterranean Diet?  . Healthy eating plan based on typical foods and recipes of Mediterranean-style cooking . The diet is primarily a plant based diet; these foods should make up a majority of meals   Starches - Plant based foods should make up a majority of meals - They are an important sources of vitamins, minerals, energy, antioxidants, and fiber - Choose whole grains, foods high in fiber and minimally processed items  - Typical grain sources include wheat, oats, barley, corn, brown rice, bulgar, farro, millet, polenta, couscous  - Various types of beans include chickpeas, lentils, fava beans, black beans, white beans   Fruits  Veggies - Large quantities of antioxidant rich fruits & veggies; 6 or more servings  - Vegetables can be eaten raw or lightly drizzled with oil and cooked  - Vegetables common to the traditional Mediterranean Diet include: artichokes, arugula, beets, broccoli, brussel sprouts, cabbage, carrots, celery, collard greens, cucumbers, eggplant, kale, leeks, lemons, lettuce, mushrooms, okra, onions, peas, peppers, potatoes, pumpkin, radishes, rutabaga, shallots, spinach, sweet potatoes, turnips, zucchini - Fruits common to the Mediterranean Diet include: apples,  apricots, avocados, cherries, clementines, dates, figs, grapefruits, grapes, melons, nectarines, oranges, peaches, pears, pomegranates, strawberries, tangerines  Fats - Replace butter and margarine with healthy oils, such as olive oil, canola oil, and tahini  - Limit nuts to no more than a handful a day  - Nuts include walnuts, almonds, pecans, pistachios, pine nuts  - Limit or avoid candied, honey roasted or heavily salted nuts - Olives are central to the [employer] - can be eaten whole or used in a variety of dishes   Meats Protein - Limiting red meat: no more than a few times a month - When eating red meat: choose lean cuts and keep the portion to the size of deck of cards - Eggs: approx. 0 to 4 times a week  - Fish and lean poultry: at least 2 a week  - Healthy protein sources include, chicken, [place], lean beef, lamb - Increase intake of seafood such as tuna, salmon, trout, mackerel, shrimp, scallops - Avoid or limit high fat processed meats such as sausage and bacon  Dairy - Include moderate amounts of low fat dairy products  - Focus on healthy dairy such as fat free yogurt, skim milk, low or reduced fat cheese - Limit dairy products higher in fat such as whole or 2% milk,  cheese, ice cream  Alcohol - Moderate amounts of red wine is ok  - No more than 5 oz daily for women (all ages) and men older than age 70  - No more than 10 oz of wine daily for men younger than 76  Other - Limit sweets and other desserts  - Use herbs and spices instead of salt to flavor foods  - Herbs and spices common to the traditional Mediterranean Diet include: basil, bay leaves, chives, cloves, cumin, fennel, garlic, lavender, marjoram, mint, oregano, parsley, pepper, rosemary, sage, savory, sumac, tarragon, thyme   It's not just a diet, it's a lifestyle:  . The Mediterranean diet includes lifestyle factors typical of those in the region  . Foods, drinks and meals are best eaten with others and  savored . Daily physical activity is important for overall good health . This could be strenuous exercise like running and aerobics . This could also be more leisurely activities such as walking, housework, yard-work, or taking the stairs . Moderation is the key; a balanced and healthy diet accommodates most foods and drinks . Consider portion sizes and frequency of consumption of certain foods   Meal Ideas & Options:  . Breakfast:  o Whole wheat toast or whole wheat English muffins with peanut butter & hard boiled egg o Steel cut oats topped with apples & cinnamon and skim milk  o Fresh fruit: banana, strawberries, melon, berries, peaches  o Smoothies: strawberries, bananas, greek yogurt, peanut butter o Low fat greek yogurt with blueberries and granola  o Egg white omelet with spinach and mushrooms o Breakfast couscous: whole wheat couscous, apricots, skim milk, cranberries  . Sandwiches:  o Hummus and grilled vegetables (peppers, zucchini, squash) on whole wheat bread   o Grilled chicken on whole wheat pita with lettuce, tomatoes, cucumbers or tzatziki  o Tuna salad on whole wheat bread: tuna salad made with greek yogurt, olives, red peppers, capers, green onions o Garlic rosemary lamb pita: lamb sauted with garlic, rosemary, salt & pepper; add lettuce, cucumber, greek yogurt to pita - flavor with lemon juice and black pepper  . Seafood:  o Mediterranean grilled salmon, seasoned with garlic, basil, parsley, lemon juice and black pepper o Shrimp, lemon, and spinach whole-grain pasta salad made with low fat greek yogurt  o Seared scallops with lemon orzo  o Seared tuna steaks seasoned salt, pepper, coriander topped with tomato mixture of olives, tomatoes, olive oil, minced garlic, parsley, green onions and cappers  . Meats:  o Herbed greek chicken salad with kalamata olives, cucumber, feta  o Red bell peppers stuffed with spinach, bulgur, lean ground beef (or lentils) & topped with feta    o Kebabs: skewers of chicken, tomatoes, onions, zucchini, squash  o [place] burgers: made with red onions, mint, dill, lemon juice, feta cheese topped with roasted red peppers . Vegetarian o Cucumber salad: cucumbers, artichoke hearts, celery, red onion, feta cheese, tossed in olive oil & lemon juice  o Hummus and whole grain pita points with a greek salad (lettuce, tomato, feta, olives, cucumbers, red onion) o Lentil soup with celery, carrots made with vegetable broth, garlic, salt and pepper  o Tabouli salad: parsley, bulgur, mint, scallions, cucumbers, tomato, radishes, lemon juice, olive oil, salt and pepper.      Chartered certified accountant Patient Education  2017 Muscoda Panosh M.D.

## 2017-04-10 ENCOUNTER — Ambulatory Visit (INDEPENDENT_AMBULATORY_CARE_PROVIDER_SITE_OTHER): Payer: 59 | Admitting: Internal Medicine

## 2017-04-10 ENCOUNTER — Encounter: Payer: Self-pay | Admitting: Internal Medicine

## 2017-04-10 VITALS — BP 124/80 | HR 76 | Temp 98.3°F | Ht 63.75 in | Wt 255.8 lb

## 2017-04-10 DIAGNOSIS — E785 Hyperlipidemia, unspecified: Secondary | ICD-10-CM

## 2017-04-10 DIAGNOSIS — Z8709 Personal history of other diseases of the respiratory system: Secondary | ICD-10-CM | POA: Diagnosis not present

## 2017-04-10 DIAGNOSIS — D473 Essential (hemorrhagic) thrombocythemia: Secondary | ICD-10-CM

## 2017-04-10 DIAGNOSIS — D75839 Thrombocytosis, unspecified: Secondary | ICD-10-CM

## 2017-04-10 DIAGNOSIS — Z79899 Other long term (current) drug therapy: Secondary | ICD-10-CM | POA: Diagnosis not present

## 2017-04-10 DIAGNOSIS — I10 Essential (primary) hypertension: Secondary | ICD-10-CM | POA: Diagnosis not present

## 2017-04-10 DIAGNOSIS — Z6841 Body Mass Index (BMI) 40.0 and over, adult: Secondary | ICD-10-CM | POA: Diagnosis not present

## 2017-04-10 DIAGNOSIS — Z0000 Encounter for general adult medical examination without abnormal findings: Secondary | ICD-10-CM | POA: Diagnosis not present

## 2017-04-10 LAB — BASIC METABOLIC PANEL
BUN: 21 mg/dL (ref 6–23)
CALCIUM: 9.3 mg/dL (ref 8.4–10.5)
CO2: 35 mEq/L — ABNORMAL HIGH (ref 19–32)
Chloride: 99 mEq/L (ref 96–112)
Creatinine, Ser: 0.67 mg/dL (ref 0.40–1.20)
GFR: 97.44 mL/min (ref >60.00)
GLUCOSE: 96 mg/dL (ref 70–99)
POTASSIUM: 4.3 meq/L (ref 3.5–5.1)
SODIUM: 140 meq/L (ref 135–145)

## 2017-04-10 LAB — CBC WITH DIFFERENTIAL/PLATELET
BASOS ABS: 0.1 10*3/uL (ref 0.0–0.1)
Basophils Relative: 0.7 % (ref 0.0–3.0)
Eosinophils Absolute: 0.4 10*3/uL (ref 0.0–0.7)
Eosinophils Relative: 5 % (ref 0.0–5.0)
HCT: 41.6 % (ref 36.0–46.0)
HEMOGLOBIN: 13.6 g/dL (ref 12.0–15.0)
LYMPHS ABS: 1.7 10*3/uL (ref 0.7–4.0)
Lymphocytes Relative: 23.6 % (ref 12.0–46.0)
MCHC: 32.7 g/dL (ref 30.0–36.0)
MCV: 86 fl (ref 78.0–100.0)
MONO ABS: 0.5 10*3/uL (ref 0.1–1.0)
Monocytes Relative: 6.5 % (ref 3.0–12.0)
NEUTROS PCT: 64.2 % (ref 43.0–77.0)
Neutro Abs: 4.7 10*3/uL (ref 1.4–7.7)
Platelets: 464 10*3/uL — ABNORMAL HIGH (ref 150.0–400.0)
RBC: 4.84 Mil/uL (ref 3.87–5.11)
RDW: 14 % (ref 11.5–15.5)
WBC: 7.3 10*3/uL (ref 4.0–10.5)

## 2017-04-10 LAB — LIPID PANEL
CHOL/HDL RATIO: 5
CHOLESTEROL: 203 mg/dL — AB (ref 0–200)
HDL: 39.3 mg/dL (ref >39.00)
NONHDL: 163.99
Triglycerides: 207 mg/dL — ABNORMAL HIGH (ref 0.0–149.0)
VLDL: 41.4 mg/dL — AB (ref 0.0–40.0)

## 2017-04-10 LAB — HEPATIC FUNCTION PANEL
ALK PHOS: 51 U/L (ref 39–117)
ALT: 17 U/L (ref 0–35)
AST: 17 U/L (ref 0–37)
Albumin: 4.3 g/dL (ref 3.5–5.2)
BILIRUBIN DIRECT: 0.1 mg/dL (ref 0.0–0.3)
BILIRUBIN TOTAL: 0.8 mg/dL (ref 0.2–1.2)
Total Protein: 7.1 g/dL (ref 6.0–8.3)

## 2017-04-10 LAB — TSH: TSH: 1.11 u[IU]/mL (ref 0.35–4.50)

## 2017-04-10 LAB — LDL CHOLESTEROL, DIRECT: LDL DIRECT: 123 mg/dL

## 2017-04-10 NOTE — Patient Instructions (Addendum)
Glad your asthma is stableand blood pressure is controlled. 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 Avoiding simple carbohydrates processed foods are helpful not eating late at night and trying to get enough sleep. If you think you are having symptoms of sleep apnea contact us for consult referral.  We'll let you know lab results when they're available follow-up yearly if all is well.   Check into shingles vaccine. Shingrix  Can receive either in the office or pharmacy depending on insurance rules.       Preventive Care 40-64 Years, Female Preventive care refers to lifestyle choices and visits with your health care provider that can promote health and wellness. What does preventive care include?  A yearly physical exam. This is also called an annual well check.  Dental exams once or twice a year.  Routine eye exams. Ask your health care provider how often you should have your eyes checked.  Personal lifestyle choices, including: ? Daily care of your teeth and gums. ? Regular physical activity. ? Eating a healthy diet. ? Avoiding tobacco and drug use. ? Limiting alcohol use. ? Practicing safe sex. ? Taking low-dose aspirin daily starting at age 66. ? Taking vitamin and mineral supplements as recommended by your health care provider. What happens during an annual well check? The services and screenings done by your health care provider during your annual well check will depend on your age, overall health, lifestyle risk factors, and family history of disease. Counseling Your health care provider may ask you questions about your:  Alcohol use.  Tobacco use.  Drug use.  Emotional well-being.  Home and relationship well-being.  Sexual activity.  Eating habits.  Work and work Statistician.  Method of birth control.  Menstrual cycle.  Pregnancy history.  Screening You may have the following tests or measurements:  Height,  weight, and BMI.  Blood pressure.  Lipid and cholesterol levels. These may be checked every 5 years, or more frequently if you are over 13 years old.  Skin check.  Lung cancer screening. You may have this screening every year starting at age 59 if you have a 30-pack-year history of smoking and currently smoke or have quit within the past 15 years.  Fecal occult blood test (FOBT) of the stool. You may have this test every year starting at age 49.  Flexible sigmoidoscopy or colonoscopy. You may have a sigmoidoscopy every 5 years or a colonoscopy every 10 years starting at age 46.  Hepatitis C blood test.  Hepatitis B blood test.  Sexually transmitted disease (STD) testing.  Diabetes screening. This is done by checking your blood sugar (glucose) after you have not eaten for a while (fasting). You may have this done every 1-3 years.  Mammogram. This may be done every 1-2 years. Talk to your health care provider about when you should start having regular mammograms. This may depend on whether you have a family history of breast cancer.  BRCA-related cancer screening. This may be done if you have a family history of breast, ovarian, tubal, or peritoneal cancers.  Pelvic exam and Pap test. This may be done every 3 years starting at age 18. Starting at age 72, this may be done every 5 years if you have a Pap test in combination with an HPV test.  Bone density scan. This is done to screen for osteoporosis. You may have this scan if you are at high risk for osteoporosis.  Discuss your test results,  treatment options, and if necessary, the need for more tests with your health care provider. Vaccines Your health care provider may recommend certain vaccines, such as:  Influenza vaccine. This is recommended every year.  Tetanus, diphtheria, and acellular pertussis (Tdap, Td) vaccine. You may need a Td booster every 10 years.  Varicella vaccine. You may need this if you have not been  vaccinated.  Zoster vaccine. You may need this after age 66.  Measles, mumps, and rubella (MMR) vaccine. You may need at least one dose of MMR if you were born in 1957 or later. You may also need a second dose.  Pneumococcal 13-valent conjugate (PCV13) vaccine. You may need this if you have certain conditions and were not previously vaccinated.  Pneumococcal polysaccharide (PPSV23) vaccine. You may need one or two doses if you smoke cigarettes or if you have certain conditions.  Meningococcal vaccine. You may need this if you have certain conditions.  Hepatitis A vaccine. You may need this if you have certain conditions or if you travel or work in places where you may be exposed to hepatitis A.  Hepatitis B vaccine. You may need this if you have certain conditions or if you travel or work in places where you may be exposed to hepatitis B.  Haemophilus influenzae type b (Hib) vaccine. You may need this if you have certain conditions.  Talk to your health care provider about which screenings and vaccines you need and how often you need them. This information is not intended to replace advice given to you by your health care provider. Make sure you discuss any questions you have with your health care provider. Document Released: 08/31/2015 Document Revised: 04/23/2016 Document Reviewed: 06/05/2015     Why follow it? Research shows. . Those who follow the Mediterranean diet have a reduced risk of heart disease  . The diet is associated with a reduced incidence of Parkinson's and Alzheimer's diseases . People following the diet may have longer life expectancies and lower rates of chronic diseases  . The Dietary Guidelines for Americans recommends the Mediterranean diet as an eating plan to promote health and prevent disease  What Is the Mediterranean Diet?  . Healthy eating plan based on typical foods and recipes of Mediterranean-style cooking . The diet is primarily a plant based diet;  these foods should make up a majority of meals   Starches - Plant based foods should make up a majority of meals - They are an important sources of vitamins, minerals, energy, antioxidants, and fiber - Choose whole grains, foods high in fiber and minimally processed items  - Typical grain sources include wheat, oats, barley, corn, brown rice, bulgar, farro, millet, polenta, couscous  - Various types of beans include chickpeas, lentils, fava beans, black beans, white beans   Fruits  Veggies - Large quantities of antioxidant rich fruits & veggies; 6 or more servings  - Vegetables can be eaten raw or lightly drizzled with oil and cooked  - Vegetables common to the traditional Mediterranean Diet include: artichokes, arugula, beets, broccoli, brussel sprouts, cabbage, carrots, celery, collard greens, cucumbers, eggplant, kale, leeks, lemons, lettuce, mushrooms, okra, onions, peas, peppers, potatoes, pumpkin, radishes, rutabaga, shallots, spinach, sweet potatoes, turnips, zucchini - Fruits common to the Mediterranean Diet include: apples, apricots, avocados, cherries, clementines, dates, figs, grapefruits, grapes, melons, nectarines, oranges, peaches, pears, pomegranates, strawberries, tangerines  Fats - Replace butter and margarine with healthy oils, such as olive oil, canola oil, and tahini  - Limit nuts to  no more than a handful a day  - Nuts include walnuts, almonds, pecans, pistachios, pine nuts  - Limit or avoid candied, honey roasted or heavily salted nuts - Olives are central to the Mediterranean diet - can be eaten whole or used in a variety of dishes   Meats Protein - Limiting red meat: no more than a few times a month - When eating red meat: choose lean cuts and keep the portion to the size of deck of cards - Eggs: approx. 0 to 4 times a week  - Fish and lean poultry: at least 2 a week  - Healthy protein sources include, chicken, [place], lean beef, lamb - Increase intake of seafood such as  tuna, salmon, trout, mackerel, shrimp, scallops - Avoid or limit high fat processed meats such as sausage and bacon  Dairy - Include moderate amounts of low fat dairy products  - Focus on healthy dairy such as fat free yogurt, skim milk, low or reduced fat cheese - Limit dairy products higher in fat such as whole or 2% milk, cheese, ice cream  Alcohol - Moderate amounts of red wine is ok  - No more than 5 oz daily for women (all ages) and men older than age 86  - No more than 10 oz of wine daily for men younger than 43  Other - Limit sweets and other desserts  - Use herbs and spices instead of salt to flavor foods  - Herbs and spices common to the traditional Mediterranean Diet include: basil, bay leaves, chives, cloves, cumin, fennel, garlic, lavender, marjoram, mint, oregano, parsley, pepper, rosemary, sage, savory, sumac, tarragon, thyme   It's not just a diet, it's a lifestyle:  . The Mediterranean diet includes lifestyle factors typical of those in the region  . Foods, drinks and meals are best eaten with others and savored . Daily physical activity is important for overall good health . This could be strenuous exercise like running and aerobics . This could also be more leisurely activities such as walking, housework, yard-work, or taking the stairs . Moderation is the key; a balanced and healthy diet accommodates most foods and drinks . Consider portion sizes and frequency of consumption of certain foods   Meal Ideas & Options:  . Breakfast:  o Whole wheat toast or whole wheat English muffins with peanut butter & hard boiled egg o Steel cut oats topped with apples & cinnamon and skim milk  o Fresh fruit: banana, strawberries, melon, berries, peaches  o Smoothies: strawberries, bananas, greek yogurt, peanut butter o Low fat greek yogurt with blueberries and granola  o Egg white omelet with spinach and mushrooms o Breakfast couscous: whole wheat couscous, apricots, skim milk,  cranberries  . Sandwiches:  o Hummus and grilled vegetables (peppers, zucchini, squash) on whole wheat bread   o Grilled chicken on whole wheat pita with lettuce, tomatoes, cucumbers or tzatziki  o Tuna salad on whole wheat bread: tuna salad made with greek yogurt, olives, red peppers, capers, green onions o Garlic rosemary lamb pita: lamb sauted with garlic, rosemary, salt & pepper; add lettuce, cucumber, greek yogurt to pita - flavor with lemon juice and black pepper  . Seafood:  o Mediterranean grilled salmon, seasoned with garlic, basil, parsley, lemon juice and black pepper o Shrimp, lemon, and spinach whole-grain pasta salad made with low fat greek yogurt  o Seared scallops with lemon orzo  o Seared tuna steaks seasoned salt, pepper, coriander topped with tomato mixture of olives,  tomatoes, olive oil, minced garlic, parsley, green onions and cappers  . Meats:  o Herbed greek chicken salad with kalamata olives, cucumber, feta  o Red bell peppers stuffed with spinach, bulgur, lean ground beef (or lentils) & topped with feta   o Kebabs: skewers of chicken, tomatoes, onions, zucchini, squash  o [place] burgers: made with red onions, mint, dill, lemon juice, feta cheese topped with roasted red peppers . Vegetarian o Cucumber salad: cucumbers, artichoke hearts, celery, red onion, feta cheese, tossed in olive oil & lemon juice  o Hummus and whole grain pita points with a greek salad (lettuce, tomato, feta, olives, cucumbers, red onion) o Lentil soup with celery, carrots made with vegetable broth, garlic, salt and pepper  o Tabouli salad: parsley, bulgur, mint, scallions, cucumbers, tomato, radishes, lemon juice, olive oil, salt and pepper.      Chartered certified accountant Patient Education  [employer].

## 2017-04-21 ENCOUNTER — Other Ambulatory Visit: Payer: Self-pay | Admitting: Internal Medicine

## 2017-04-23 DIAGNOSIS — J029 Acute pharyngitis, unspecified: Secondary | ICD-10-CM | POA: Diagnosis not present

## 2017-05-08 ENCOUNTER — Encounter: Payer: Self-pay | Admitting: Internal Medicine

## 2017-05-09 DIAGNOSIS — Z23 Encounter for immunization: Secondary | ICD-10-CM | POA: Diagnosis not present

## 2017-05-21 DIAGNOSIS — Z719 Counseling, unspecified: Secondary | ICD-10-CM | POA: Diagnosis not present

## 2017-06-02 DIAGNOSIS — Z719 Counseling, unspecified: Secondary | ICD-10-CM | POA: Diagnosis not present

## 2017-06-09 DIAGNOSIS — Z719 Counseling, unspecified: Secondary | ICD-10-CM | POA: Diagnosis not present

## 2017-06-16 DIAGNOSIS — Z719 Counseling, unspecified: Secondary | ICD-10-CM | POA: Diagnosis not present

## 2017-06-23 DIAGNOSIS — Z719 Counseling, unspecified: Secondary | ICD-10-CM | POA: Diagnosis not present

## 2017-06-30 DIAGNOSIS — Z719 Counseling, unspecified: Secondary | ICD-10-CM | POA: Diagnosis not present

## 2017-07-07 DIAGNOSIS — Z719 Counseling, unspecified: Secondary | ICD-10-CM | POA: Diagnosis not present

## 2017-07-21 DIAGNOSIS — Z719 Counseling, unspecified: Secondary | ICD-10-CM | POA: Diagnosis not present

## 2017-08-16 ENCOUNTER — Other Ambulatory Visit: Payer: Self-pay | Admitting: Internal Medicine

## 2017-10-14 ENCOUNTER — Emergency Department (HOSPITAL_COMMUNITY)
Admission: EM | Admit: 2017-10-14 | Discharge: 2017-10-14 | Disposition: A | Discharge status: Home / Self Care | Payer: 59 | Attending: Emergency Medicine | Admitting: Emergency Medicine

## 2017-10-14 ENCOUNTER — Ambulatory Visit: Payer: Self-pay | Admitting: *Deleted

## 2017-10-14 ENCOUNTER — Emergency Department (HOSPITAL_COMMUNITY): Payer: 59

## 2017-10-14 ENCOUNTER — Other Ambulatory Visit: Payer: Self-pay

## 2017-10-14 ENCOUNTER — Encounter (HOSPITAL_COMMUNITY): Payer: Self-pay | Admitting: Emergency Medicine

## 2017-10-14 DIAGNOSIS — J45909 Unspecified asthma, uncomplicated: Secondary | ICD-10-CM | POA: Diagnosis not present

## 2017-10-14 DIAGNOSIS — I10 Essential (primary) hypertension: Secondary | ICD-10-CM | POA: Diagnosis not present

## 2017-10-14 DIAGNOSIS — R0789 Other chest pain: Secondary | ICD-10-CM | POA: Insufficient documentation

## 2017-10-14 DIAGNOSIS — R05 Cough: Secondary | ICD-10-CM | POA: Diagnosis not present

## 2017-10-14 DIAGNOSIS — Z79899 Other long term (current) drug therapy: Secondary | ICD-10-CM | POA: Insufficient documentation

## 2017-10-14 DIAGNOSIS — R0602 Shortness of breath: Secondary | ICD-10-CM | POA: Diagnosis not present

## 2017-10-14 DIAGNOSIS — R7989 Other specified abnormal findings of blood chemistry: Secondary | ICD-10-CM | POA: Diagnosis not present

## 2017-10-14 DIAGNOSIS — R079 Chest pain, unspecified: Secondary | ICD-10-CM | POA: Diagnosis not present

## 2017-10-14 LAB — CBC
HCT: 40.1 % (ref 36.0–46.0)
Hemoglobin: 12.7 g/dL (ref 12.0–15.0)
MCH: 27.9 pg (ref 26.0–34.0)
MCHC: 31.7 g/dL (ref 30.0–36.0)
MCV: 88.1 fL (ref 78.0–100.0)
PLATELETS: 415 10*3/uL — AB (ref 150–400)
RBC: 4.55 MIL/uL (ref 3.87–5.11)
RDW: 14.1 % (ref 11.5–15.5)
WBC: 9 10*3/uL (ref 4.0–10.5)

## 2017-10-14 LAB — BASIC METABOLIC PANEL
Anion gap: 12 (ref 5–15)
BUN: 19 mg/dL (ref 6–20)
CHLORIDE: 99 mmol/L — AB (ref 101–111)
CO2: 28 mmol/L (ref 22–32)
CREATININE: 0.71 mg/dL (ref 0.44–1.00)
Calcium: 8.8 mg/dL — ABNORMAL LOW (ref 8.9–10.3)
GFR calc Af Amer: >60 mL/min (ref >60)
GFR calc non Af Amer: >60 mL/min (ref >60)
GLUCOSE: 114 mg/dL — AB (ref 65–99)
Potassium: 3.5 mmol/L (ref 3.5–5.1)
SODIUM: 139 mmol/L (ref 135–145)

## 2017-10-14 LAB — BRAIN NATRIURETIC PEPTIDE: B Natriuretic Peptide: 19.1 pg/mL (ref 0.0–100.0)

## 2017-10-14 LAB — D-DIMER, QUANTITATIVE: D-Dimer, Quant: 1 ug/mL-FEU — ABNORMAL HIGH (ref 0.00–0.50)

## 2017-10-14 LAB — I-STAT TROPONIN, ED
Troponin i, poc: 0 ng/mL (ref 0.00–0.08)
Troponin i, poc: 0 ng/mL (ref 0.00–0.08)

## 2017-10-14 MED ORDER — IOPAMIDOL (ISOVUE-370) INJECTION 76%
INTRAVENOUS | Status: AC
  Administered 2017-10-14: 100 mL
  Filled 2017-10-14: qty 100

## 2017-10-14 MED ORDER — HYDROXYZINE HCL 25 MG PO TABS
25.0000 mg | ORAL_TABLET | Freq: Once | ORAL | Status: AC
  Administered 2017-10-14: 25 mg via ORAL
  Filled 2017-10-14: qty 1

## 2017-10-14 MED ORDER — LORAZEPAM 2 MG/ML IJ SOLN
0.5000 mg | Freq: Once | INTRAMUSCULAR | Status: AC
  Administered 2017-10-14: 0.5 mg via INTRAVENOUS
  Filled 2017-10-14: qty 1

## 2017-10-14 NOTE — ED Notes (Signed)
Lab results reviewed.  No change in acuity at this time

## 2017-10-14 NOTE — Telephone Encounter (Signed)
Will monitor for ED arrival.  

## 2017-10-14 NOTE — Telephone Encounter (Signed)
Confirmed patient has arrived in the ER now. 

## 2017-10-14 NOTE — Telephone Encounter (Signed)
Pt reports "fluttering in chest." Onset yesterday. States 40-50 times since yesterday afternoon. Lasts 30 seconds. Reports feeling lightheaded "room spins at times." States occurs at rest and with positional changes; occurs during palpitations, "feels like I'm going to pass out."  States is presently feeling lightheaded. Pt unable to palpate pulse to check HR during call. Reports mild SOB, H/O asthma but "feels different than that." Denies any nausea, diaphoresis, CP.  Pt states she took an aspirin today. Directed to ED. Husband will drive.  Reason for Disposition . Dizziness, lightheadedness, or weakness  Answer Assessment - Initial Assessment Questions 1. DESCRIPTION: "Please describe your heart rate or heart beat that you are having" (e.g., fast/slow, regular/irregular, skipped or extra beats, "palpitations")     "Fluttering" 2. ONSET: "When did it start?" (Minutes, hours or days)     Onset 3. DURATION: "How long does it last" (e.g., seconds, minutes, hours)     30 seconds 4. PATTERN "Does it come and go, or has it been constant since it started?"  "Does it get worse with exertion?"   "Are you feeling it now?"     Comes and goes; Not sure about exertion, not feeling now 5. TAP: "Using your hand, can you tap out what you are feeling on a chair or table in front of you, so that I can hear?" (Note: not all patients can do this)        6. HEART RATE: "Can you tell me your heart rate?" "How many beats in 15 seconds?"  (Note: not all patients can do this)       Unable to do 7. RECURRENT SYMPTOM: "Have you ever had this before?" If so, ask: "When was the last time?" and "What happened that time?"      No 8. CAUSE: "What do you think is causing the palpitations?"     Unsure 9. CARDIAC HISTORY: "Do you have any history of heart disease?" (e.g., heart attack, angina, bypass surgery, angioplasty, arrhythmia)      Yes 10. OTHER SYMPTOMS: "Do you have any other symptoms?" (e.g., dizziness, chest pain,  sweating, difficulty breathing)       Lightheaded, positional, mild SOB, intermittent 11. PREGNANCY: "Is there any chance you are pregnant?" "When was your last menstrual period?"      no  Protocols used: HEART RATE AND HEARTBEAT QUESTIONS-A-AH

## 2017-10-14 NOTE — ED Provider Notes (Signed)
[facility] EMERGENCY DEPARTMENT Provider Note   CSN: [id number] Arrival date & time: 10/14/17  1430     History   Chief Complaint Chief Complaint  Patient presents with  . Chest Pain  . Shortness of Breath    HPI Danielle Martin is a 57 y.o. female with history of hypertension, asthma, migraine, distant history of SVT, who presents with a 1 day history of intermittent chest heaviness with palpitations, chest tightness, and shortness of breath.  Patient also reports feeling lightheaded and dizzy.  Patient reports she was waiting for her to go water yesterday, sitting down, when her symptoms first came on.  She reports that have been intermittent since then.  She reports feeling like she had tunnel vision and numbness throughout her whole body, and then her symptoms resolved. The chest pain and palpitations, as well as shortness of breath has returned intermittently since then.  She reports taking aspirin earlier today and yesterday.  She denies any abdominal pain, nausea, vomiting, back pain, urinary symptoms.  She reports she has been under more stress than usual lately.  She has no history of anxiety or panic attacks.  She denies any recent long trips, surgeries, new leg pain or swelling, known cancer, history of blood clots, exogenous estrogen use.  Patient reports this does not feel anything like her asthma.  HPI  Past Medical History:  Diagnosis Date  . Allergic rhinitis    Worse in winter , grasses  . Asthma    Worse in winter  . Gall stone pancreatitis    lap choley 1 2017  . GERD (gastroesophageal reflux disease)   . [id number](478.2)    "bi-weekly" (09/03/2015)  . HEMORRHAGIC CYSTITIS 05/25/2009   Qualifier: Diagnosis of  By: Regis Bill MD, Standley Brooking   . Hypertension   . Iron deficiency anemia 05/01/2015  . Migraine    "q couple months" (09/03/2015)  . Pneumonia    "a few times" (09/03/2015)  . SVT (supraventricular tachycardia) (HCC)    No longer a problem     Patient Active Problem List   Diagnosis Date Noted  . Cervical radiculopathy 10/25/2016  . HLD (hyperlipidemia) 04/08/2016  . Hypertension 11/25/2013  . Neoplasm of uncertain behavior of skin 07/20/2012  . Visit for preventive health examination 07/20/2012  . SVT (supraventricular tachycardia) ([place])   . Allergic rhinitis 05/13/2008  . OBESITY, UNSPECIFIED 05/08/2008  . Thrombocytosis ([place]) 05/08/2008  . ASTHMA 05/08/2008  . OTHER SLEEP DISTURBANCES 05/08/2008    Past Surgical History:  Procedure Laterality Date  . ANTERIOR CERVICAL DECOMP/DISCECTOMY FUSION  2008  . BACK SURGERY    . CESAREAN SECTION  1990; 1991  . CHOLECYSTECTOMY N/A 09/04/2015   Procedure: LAPAROSCOPIC CHOLECYSTECTOMY WITH INTRAOPERATIVE CHOLANGIOGRAM;  Surgeon: Greer Pickerel, MD;  Location: [facility];  Service: General;  Laterality: N/A;  . OOPHORECTOMY  1980s   "all of the right; part of the left"  . VAGINAL HYSTERECTOMY  2010    OB History    No data available       Home Medications    Prior to Admission medications   Medication Sig Start Date End Date Taking? Authorizing Provider  ADVAIR DISKUS 100-50 MCG/DOSE AEPB USE ONE INHALATION INTO THE LUNGS TWO TIMES A DAY OR AS DIRECTED Patient taking differently: USE ONE INHALATION INTO THE LUNGS TWO TIMES A DAY as needed    Panosh, Standley Brooking, MD  albuterol (PROVENTIL HFA;VENTOLIN HFA) 108 (90 BASE) MCG/ACT inhaler Inhale 2 puffs into the lungs  every 6 (six) hours as needed for wheezing. 10/26/13   Panosh, Standley Brooking, MD  albuterol (PROVENTIL) (2.5 MG/3ML) 0.083% nebulizer solution Take 3 mLs (2.5 mg total) by nebulization every 6 (six) hours as needed for wheezing or shortness of breath. 11/25/13   Shawna Orleans, Doe-Hyun R, DO  Calcium Carbonate-Vitamin D 600-400 MG-UNIT per chew tablet Chew 1 tablet by mouth daily.    [provider]  CINNAMON PO Take 1,000 Units by mouth every morning.    [provider]  fluticasone (FLONASE) 50 MCG/ACT nasal spray Place  2 sprays into both nostrils daily. 2 each nostril 10/26/13 10/26/14  Panosh, Standley Brooking, MD  losartan-hydrochlorothiazide [facility]) 50-12.5 MG tablet TAKE 1 TABLET DAILY 08/17/17   Panosh, Standley Brooking, MD  metoprolol succinate (TOPROL-XL) 50 MG 24 hr tablet TAKE 1 TABLET DAILY. TAKE  WITH OR IMMEDIATELY        FOLLOWING A MEAL. 04/21/17   Panosh, Standley Brooking, MD  Multiple Vitamin (MULTI-VITAMIN PO) Take 1 tablet by mouth daily.     [provider]  vitamin E 400 UNIT capsule Take 400 Units by mouth daily.    [provider]    Family History Family History  Problem Relation Age of Onset  . Other Mother        Died of asphyxiation  . Prostate cancer Father     Social History Social History   Tobacco Use  . Smoking status: Never Smoker  . Smokeless tobacco: Never Used  . Tobacco comment: "stopped smoking in the 1980s"  Substance Use Topics  . Alcohol use: Yes    Alcohol/week: 4.8 oz    Types: 4 Glasses of wine, 4 Shots of liquor per week  . Drug use: No     Allergies   Codeine and Ace inhibitors   Review of Systems Review of Systems  Constitutional: Positive for fatigue. Negative for chills and fever.  HENT: Negative for facial swelling and sore throat.   Respiratory: Positive for chest tightness and shortness of breath.   Cardiovascular: Positive for chest pain and palpitations.  Gastrointestinal: Negative for abdominal pain, nausea and vomiting.  Genitourinary: Negative for dysuria.  Musculoskeletal: Negative for back pain.  Skin: Negative for rash and wound.  Neurological: Positive for dizziness, light-headedness and headaches.  Psychiatric/Behavioral: The patient is not nervous/anxious.      Physical Exam Updated Vital Signs BP 131/80   Pulse 90   Temp 98.2 F (36.8 C) (Oral)   Resp 19   Ht 5\' 4"  (1.626 m)   Wt 111.6 kg (246 lb)   SpO2 93%   BMI 42.23 kg/m   Physical Exam  Constitutional: She appears well-developed and well-nourished. No distress.   HENT:  Head: Normocephalic and atraumatic.  Mouth/Throat: Oropharynx is clear and moist. No oropharyngeal exudate.  Eyes: Conjunctivae are normal. Pupils are equal, round, and reactive to light. Right eye exhibits no discharge. Left eye exhibits no discharge. No scleral icterus.  Neck: Normal range of motion. Neck supple. No thyromegaly present.  Cardiovascular: Normal rate, regular rhythm, normal heart sounds and intact distal pulses. Exam reveals no gallop and no friction rub.  No murmur heard. Pulmonary/Chest: Effort normal and breath sounds normal. No stridor. No respiratory distress. She has no wheezes. She has no rales. She exhibits no tenderness.  Abdominal: Soft. Bowel sounds are normal. She exhibits no distension. There is no tenderness. There is no rebound and no guarding.  Musculoskeletal: She exhibits no edema.  Right lower leg: She exhibits no tenderness and no edema.       Left lower leg: She exhibits no tenderness and no edema.  Lymphadenopathy:    She has no cervical adenopathy.  Neurological: She is alert. Coordination normal.  Skin: Skin is warm and dry. No rash noted. She is not diaphoretic. No pallor.  Psychiatric: She has a normal mood and affect.  Nursing note and vitals reviewed.    ED Treatments / Results  Labs (all labs ordered are listed, but only abnormal results are displayed) Labs Reviewed  BASIC METABOLIC PANEL - Abnormal; Notable for the following components:      Result Value   Chloride 99 (*)    Glucose, Bld 114 (*)    Calcium 8.8 (*)    All other components within normal limits  CBC - Abnormal; Notable for the following components:   Platelets 415 (*)    All other components within normal limits  D-DIMER, QUANTITATIVE (NOT AT [facility]) - Abnormal; Notable for the following components:   D-Dimer, Quant 1.00 (*)    All other components within normal limits  BRAIN NATRIURETIC PEPTIDE  I-STAT TROPONIN, ED  I-STAT TROPONIN, ED    EKG  EKG  Interpretation  Date/Time:  Wednesday October 14 2017 14:33:20 EST Ventricular Rate:  71 PR Interval:  172 QRS Duration: 92 QT Interval:  420 QTC Calculation: 456 R Axis:   1 Text Interpretation:  Normal sinus rhythm Septal infarct , age undetermined Abnormal ECG No significant change since last tracing Confirmed by Deno Etienne [phone]) on 10/14/2017 6:04:41 PM       Radiology Dg Chest 2 View  Result Date: 10/14/2017 CLINICAL DATA:  Chest pain and cough EXAM: CHEST  2 VIEW COMPARISON:  September 01, 2013 FINDINGS: There is no edema or consolidation. The heart size and pulmonary vascularity are normal. No adenopathy. There is postoperative change in the lower cervical spine. No pneumothorax. IMPRESSION: No edema or consolidation. Electronically Signed   By: Lowella Grip III M.D.   On: 10/14/2017 15:24   Ct Angio Chest Pe W And/or Wo Contrast  Result Date: 10/14/2017 CLINICAL DATA:  Chest tightness, elevated D-dimer EXAM: CT ANGIOGRAPHY CHEST WITH CONTRAST TECHNIQUE: Multidetector CT imaging of the chest was performed using the standard protocol during bolus administration of intravenous contrast. Multiplanar CT image reconstructions and MIPs were obtained to evaluate the vascular anatomy. CONTRAST:  56 mL Isovue 370 IV COMPARISON:  Chest x-ray 10/14/2017 FINDINGS: Cardiovascular: Heart is normal size. Aorta is normal caliber. No filling defects in the pulmonary arteries to suggest pulmonary emboli. Mediastinum/Nodes: No mediastinal, hilar, or axillary adenopathy. Moderate hiatal hernia. Lungs/Pleura: Lingular atelectasis. No confluent opacities otherwise. No effusions. Upper Abdomen: Imaging into the upper abdomen shows no acute findings. Prior cholecystectomy. Musculoskeletal: Chest wall soft tissues are unremarkable. No acute bony abnormality. Review of the MIP images confirms the above findings. IMPRESSION: No evidence of pulmonary embolus. No acute cardiopulmonary disease. Electronically  Signed   By: Rolm Baptise M.D.   On: 10/14/2017 21:06    Procedures Procedures (including critical care time)  Medications Ordered in ED Medications  hydrOXYzine (ATARAX/VISTARIL) tablet 25 mg (25 mg Oral Given 10/14/17 2025)  iopamidol (ISOVUE-370) 76 % injection (100 mLs  Contrast Given 10/14/17 2050)  LORazepam (ATIVAN) injection 0.5 mg (0.5 mg Intravenous Given 10/14/17 2158)     Initial Impression / Assessment and Plan / ED Course  I have reviewed the triage vital signs and the nursing notes.  Pertinent  labs & imaging results that were available during my care of the patient were reviewed by me and considered in my medical decision making (see chart for details).  Clinical Course as of Oct 15 2227  Wed Oct 14, 2017  2226 Patient pain improved after Ativan 0.5 mg.  Considering delta troponin negative, negative CT angio, suspect pain could be related to anxiety.  [AL]    Clinical Course User Index [AL] Frederica Kuster, PA-C    Patient with atypical chest pain and shortness of breath.  Improved in the ED with Ativan.  Delta troponin is negative.  CT angio PE study is negative for PE.  BNP 19.1.  BMP shows chloride 99, glucose 114, calcium 8.8.  EKG shows NSR, unchanged from past.  Chest x-ray is negative.  Patient to follow-up with PCP and cardiology for further evaluation, however pain could be related to anxiety, as patient has been under more stress recently.  Strict return precautions given.  Patient understands and agrees with plan.  Patient vitals stable throughout ED course and discharged in satisfactory condition. I discussed patient case with Dr. Tyrone Nine who guided the patient's management and agrees with plan.   Final Clinical Impressions(s) / ED Diagnoses   Final diagnoses:  Atypical chest pain    ED Discharge Orders    None       Frederica Kuster, PA-C 10/14/17 [place], DO 10/14/17 2258

## 2017-10-14 NOTE — ED Triage Notes (Signed)
Onset one day ago continued today chest pain with  Palpitations, tightness andshortness of breath. Currently 5/10 tightness with intermittent palpitations.

## 2017-10-14 NOTE — ED Notes (Signed)
Pt reports a heaviness and fluttering in her chest that started this evening while waiting for her supper. Pt also reports a headache, dizziness, and numbness in her limbs. Pt states this all lasted for 45 secs.

## 2017-10-14 NOTE — Discharge Instructions (Signed)
Please follow-up with your doctor as well as cardiology for further evaluation and treatment of your chest pain.  Please return to the emergency department if you develop any new or worsening symptoms.

## 2017-10-19 ENCOUNTER — Encounter: Payer: Self-pay | Admitting: Internal Medicine

## 2017-10-19 ENCOUNTER — Ambulatory Visit (INDEPENDENT_AMBULATORY_CARE_PROVIDER_SITE_OTHER): Payer: 59 | Admitting: Internal Medicine

## 2017-10-19 VITALS — BP 122/82 | HR 63 | Temp 97.7°F | Wt 255.0 lb

## 2017-10-19 DIAGNOSIS — F439 Reaction to severe stress, unspecified: Secondary | ICD-10-CM

## 2017-10-19 DIAGNOSIS — R0789 Other chest pain: Secondary | ICD-10-CM

## 2017-10-19 DIAGNOSIS — Z79899 Other long term (current) drug therapy: Secondary | ICD-10-CM | POA: Diagnosis not present

## 2017-10-19 DIAGNOSIS — F419 Anxiety disorder, unspecified: Secondary | ICD-10-CM

## 2017-10-19 MED ORDER — LORAZEPAM 0.5 MG PO TABS: ORAL_TABLET | ORAL | 0 refills | Status: AC

## 2017-10-19 MED ORDER — ESCITALOPRAM OXALATE 10 MG PO TABS: 5.0000 mg | ORAL_TABLET | Freq: Every day | ORAL | 1 refills | Status: DC

## 2017-10-19 NOTE — Patient Instructions (Addendum)
I agree this is probably anxiety and we will address this .    Best with counseling  Because this is a situational   Problem .  Taking care of your health   Take 15 min break  In day .   Begin  controller med and  Can use ativan as rescue     Stress and Stress Management Stress is a normal reaction to life events. It is what you feel when life demands more than you are used to or more than you can handle. Some stress can be useful. For example, the stress reaction can help you catch the last bus of the day, study for a test, or meet a deadline at work. But stress that occurs too often or for too long can cause problems. It can affect your emotional health and interfere with relationships and normal daily activities. Too much stress can weaken your immune system and increase your risk for physical illness. If you already have a medical problem, stress can make it worse. What are the causes? All sorts of life events may cause stress. An event that causes stress for one person may not be stressful for another person. Major life events commonly cause stress. These may be positive or negative. Examples include losing your job, moving into a new home, getting married, having a baby, or losing a loved one. Less obvious life events may also cause stress, especially if they occur day after day or in combination. Examples include working long hours, driving in traffic, caring for children, being in debt, or being in a difficult relationship. What are the signs or symptoms? Stress may cause emotional symptoms including, the following:  Anxiety. This is feeling worried, afraid, on edge, overwhelmed, or out of control.  Anger. This is feeling irritated or impatient.  Depression. This is feeling sad, down, helpless, or guilty.  Difficulty focusing, remembering, or making decisions.  Stress may cause physical symptoms, including the following:  Aches and pains. These may affect your head, neck, back,  stomach, or other areas of your body.  Tight muscles or clenched jaw.  Low energy or trouble sleeping.  Stress may cause unhealthy behaviors, including the following:  Eating to feel better (overeating) or skipping meals.  Sleeping too little, too much, or both.  Working too much or putting off tasks (procrastination).  Smoking, drinking alcohol, or using drugs to feel better.  How is this diagnosed? Stress is diagnosed through an assessment by your health care provider. Your health care provider will ask questions about your symptoms and any stressful life events.Your health care provider will also ask about your medical history and may order blood tests or other tests. Certain medical conditions and medicine can cause physical symptoms similar to stress. Mental illness can cause emotional symptoms and unhealthy behaviors similar to stress. Your health care provider may refer you to a mental health professional for further evaluation. How is this treated? Stress management is the recommended treatment for stress.The goals of stress management are reducing stressful life events and coping with stress in healthy ways. Techniques for reducing stressful life events include the following:  Stress identification. Self-monitor for stress and identify what causes stress for you. These skills may help you to avoid some stressful events.  Time management. Set your priorities, keep a calendar of events, and learn to say "no." These tools can help you avoid making too many commitments.  Techniques for coping with stress include the following:  Rethinking the problem. Try  to think realistically about stressful events rather than ignoring them or overreacting. Try to find the positives in a stressful situation rather than focusing on the negatives.  Exercise. Physical exercise can release both physical and emotional tension. The key is to find a form of exercise you enjoy and do it  regularly.  Relaxation techniques. These relax the body and mind. Examples include yoga, meditation, tai chi, biofeedback, deep breathing, progressive muscle relaxation, listening to music, being out in nature, journaling, and other hobbies. Again, the key is to find one or more that you enjoy and can do regularly.  Healthy lifestyle. Eat a balanced diet, get plenty of sleep, and do not smoke. Avoid using alcohol or drugs to relax.  Strong support network. Spend time with family, friends, or other people you enjoy being around.Express your feelings and talk things over with someone you trust.  Counseling or talktherapy with a mental health professional may be helpful if you are having difficulty managing stress on your own. Medicine is typically not recommended for the treatment of stress.Talk to your health care provider if you think you need medicine for symptoms of stress. Follow these instructions at home:  Keep all follow-up visits as directed by your health care provider.  Take all medicines as directed by your health care provider. Contact a health care provider if:  Your symptoms get worse or you start having new symptoms.  You feel overwhelmed by your problems and can no longer manage them on your own. Get help right away if:  You feel like hurting yourself or someone else. This information is not intended to replace advice given to you by your health care provider. Make sure you discuss any questions you have with your health care provider. Document Released: 01/28/2001 Document Revised: 01/10/2016 Document Reviewed: 03/29/2013 Elsevier Interactive Patient Education  2017 [employer].

## 2017-10-19 NOTE — Progress Notes (Signed)
Chief Complaint  Patient presents with  . Hospitalization Follow-up    Pt was seen in [facility] ED on 2/27 for chest pains. Heart Attacks ruled out, dx anxiety/panic attack. Pt states that she still feels tense, shallow breathing, dizzy and chest tightness    HPI: Danielle Martin 57 y.o. come in for  Fu ed visit and observation for atypical cp   See below  eval . Had come back from vacation and then work and had chest tightness and anxiety  She thinks it may be  Anxiety .  Lots of job stress since November  .  No hxof panic  But feels like a panic attack .   Went back to work Friday .   ahd some chest tight but no other sx  Sleep  Not as good with this stress  Neg  No etoh  D  No reg exercise now  Job   About 55 - 60 .  No hx such neg fam hx  Husband supportive  No asthma flare   Clinical Course as of Oct 15 2227  Wed Oct 14, 2017  2226 Patient pain improved after Ativan 0.5 mg.  Considering delta troponin negative, negative CT angio, suspect pain could be related to anxiety.  [AL]    Clinical Course User Index [AL] Frederica Kuster, PA-C    Patient with atypical chest pain and shortness of breath.  Improved in the ED with Ativan.  Delta troponin is negative.  CT angio PE study is negative for PE.  BNP 19.1.  BMP shows chloride 99, glucose 114, calcium 8.8.  EKG shows NSR, unchanged from past.  Chest x-ray is negative.  Patient to follow-up with PCP and cardiology for further evaluation, however pain could be related to anxiety, as patient has been under more stress recently.  Strict return precautions given.  Patient understands and agrees with plan.  Patient vitals stable throughout ED course and discharged in satisfactory condition. I discussed patient case with Dr. Tyrone Nine who guided the patient's management and agrees with plan.   Final Clinical Impressions(s) / ED Diagnoses   Final diagnoses:  Atypical chest pain    ROS: See pertinent positives and negatives per HPI.  Past  Medical History:  Diagnosis Date  . Allergic rhinitis    Worse in winter , grasses  . Asthma    Worse in winter  . Gall stone pancreatitis    lap choley 1 2017  . GERD (gastroesophageal reflux disease)   . OXBDZHGD(924.2)    "bi-weekly" (09/03/2015)  . HEMORRHAGIC CYSTITIS 05/25/2009   Qualifier: Diagnosis of  By: Regis Bill MD, Standley Brooking   . Hypertension   . Iron deficiency anemia 05/01/2015  . Migraine    "q couple months" (09/03/2015)  . Pneumonia    "a few times" (09/03/2015)  . SVT (supraventricular tachycardia) (HCC)    No longer a problem    Family History  Problem Relation Age of Onset  . Other Mother        Died of asphyxiation  . Prostate cancer Father     Social History   Socioeconomic History  . Marital status: Married    Spouse name: None  . Number of children: None  . Years of education: None  . Highest education level: None  Social Needs  . Financial resource strain: None  . Food insecurity - worry: None  . Food insecurity - inability: None  . Transportation needs - medical: None  . Transportation needs -  non-medical: None  Occupational History  . None  Tobacco Use  . Smoking status: Never Smoker  . Smokeless tobacco: Never Used  . Tobacco comment: "stopped smoking in the 1980s"  Substance and Sexual Activity  . Alcohol use: Yes    Alcohol/week: 4.8 oz    Types: 4 Glasses of wine, 4 Shots of liquor per week  . Drug use: No  . Sexual activity: Yes  Other Topics Concern  . 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 ADVAIR DISKUS 100-50 MCG/DOSE AEPB USE ONE INHALATION INTO THE LUNGS TWO TIMES A DAY OR AS DIRECTED (Patient taking differently: USE ONE INHALATION INTO THE LUNGS TWO  TIMES A DAY as needed) 3 each 1  . albuterol (PROVENTIL HFA;VENTOLIN HFA) 108 (90 BASE) MCG/ACT inhaler Inhale 2 puffs into the lungs every 6 (six) hours as needed for wheezing.    Marland Kitchen albuterol (PROVENTIL) (2.5 MG/3ML) 0.083% nebulizer solution Take 3 mLs (2.5 mg total) by nebulization every 6 (six) hours as needed for wheezing or shortness of breath. 150 mL 1  . Calcium Carbonate-Vitamin D 600-400 MG-UNIT per chew tablet Chew 1 tablet by mouth daily.    Marland Kitchen CINNAMON PO Take 1,000 Units by mouth every morning.    Marland Kitchen losartan-hydrochlorothiazide (HYZAAR) 50-12.5 MG tablet TAKE 1 TABLET DAILY 90 tablet 1  . metoprolol succinate (TOPROL-XL) 50 MG 24 hr tablet TAKE 1 TABLET DAILY. TAKE  WITH OR IMMEDIATELY        FOLLOWING A MEAL. 90 tablet 3  . Multiple Vitamin (MULTI-VITAMIN PO) Take 1 tablet by mouth daily.     . vitamin E 400 UNIT capsule Take 400 Units by mouth daily.    . fluticasone (FLONASE) 50 MCG/ACT nasal spray Place 2 sprays into both nostrils daily. 2 each nostril 48 g 3   No facility-administered medications prior to visit.      EXAM:  BP 122/82 (BP Location: Right Arm, Patient Position: Sitting, Cuff Size: Normal)   Pulse 63   Temp 97.7 F (36.5 C) (Oral)   Wt 255 lb (115.7 kg)   BMI 43.77 kg/m   Body mass index is 43.77 kg/m.  GENERAL: vitals reviewed and listed above, alert, oriented, appears well hydrated and in no acute distress HEENT: atraumatic, conjunctiva  clear, no obvious abnormalities on inspection of external nose and ears OP : no lesion edema or exudate  NECK: no obvious masses on inspection palpation  LUNGS: clear to auscultation bilaterally, no wheezes, rales or rhonchi, good air movement CV: HRRR, no clubbing cyanosis or  peripheral edema nl cap refill  Abdomen:  Sof,t normal bowel sounds without hepatosplenomegaly, no guarding rebound or masses no CVA tenderness MS: moves all extremities without noticeable focal  abnormality PSYCH: pleasant and cooperative,  emotional when talkinga bout the stress at work   But nl  Speech and affect .  Lab Results  Component Value Date   WBC 9.0 10/14/2017   HGB 12.7 10/14/2017   HCT 40.1 10/14/2017   PLT 415 (H) 10/14/2017   GLUCOSE 114 (H) 10/14/2017   CHOL 203 (H) 04/10/2017  TRIG 207.0 (H) 04/10/2017   HDL 39.30 04/10/2017   LDLDIRECT 123.0 04/10/2017   LDLCALC 95 11/13/2014   ALT 17 04/10/2017   AST 17 04/10/2017   NA 139 10/14/2017   K 3.5 10/14/2017   CL 99 (L) 10/14/2017   CREATININE 0.71 10/14/2017   BUN 19 10/14/2017   CO2 28 10/14/2017   TSH 1.11 04/10/2017   INR 1.0 01/24/2009   HGBA1C 5.9 10/01/2015   BP Readings from Last 3 Encounters:  10/19/17 122/82  10/14/17 131/80  04/10/17 124/80   Ed notes eval reviewed  ASSESSMENT AND PLAN:  Discussed the following assessment and plan:  Atypical chest pain  Medication management  Stress  Anxiety - attacks   assoc with work situation. prob anxiety related although has some risk   Will  Work on the stress anxiety rx first    Consideration of stress test echo etc if appropriate but at this time  Seems  Stress related   Risk benefit of medication discussed. Add low dose ssri and  As needed rescue med for now  And rov in 3-4 weeks  Strongly advised counseling to help with  Situational anxiety and life changes    consider  Echoand or  stress test    moderate HH in chest   Elevated d dimer 1- EKG dec anterior forces v1-3  Patient advised to return or notify health care team  if  new concerns arise.  Patient Instructions   I agree this is probably anxiety and we will address this .    Best with counseling  Because this is a situational   Problem .  Taking care of your health   Take 15 min break  In day .   Begin  controller med and  Can use ativan as rescue     Stress and Stress Management Stress is a normal reaction to life events. It is what you feel when life demands more than you are used to or more than you can handle.  Some stress can be useful. For example, the stress reaction can help you catch the last bus of the day, study for a test, or meet a deadline at work. But stress that occurs too often or for too long can cause problems. It can affect your emotional health and interfere with relationships and normal daily activities. Too much stress can weaken your immune system and increase your risk for physical illness. If you already have a medical problem, stress can make it worse. What are the causes? All sorts of life events may cause stress. An event that causes stress for one person may not be stressful for another person. Major life events commonly cause stress. These may be positive or negative. Examples include losing your job, moving into a new home, getting married, having a baby, or losing a loved one. Less obvious life events may also cause stress, especially if they occur day after day or in combination. Examples include working long hours, driving in traffic, caring for children, being in debt, or being in a difficult relationship. What are the signs or symptoms? Stress may cause emotional symptoms including, the following:  Anxiety. This is feeling worried, afraid, on edge, overwhelmed, or out of control.  Anger. This is feeling irritated or impatient.  Depression. This is feeling sad, down, helpless, or guilty.  Difficulty focusing, remembering, or making decisions.  Stress may cause physical symptoms, including the following:  Aches and pains. These may affect your head, neck,  back, stomach, or other areas of your body.  Tight muscles or clenched jaw.  Low energy or trouble sleeping.  Stress may cause unhealthy behaviors, including the following:  Eating to feel better (overeating) or skipping meals.  Sleeping too little, too much, or both.  Working too much or putting off tasks (procrastination).  Smoking, drinking alcohol, or using drugs to feel better.  How is this  diagnosed? Stress is diagnosed through an assessment by your health care provider. Your health care provider will ask questions about your symptoms and any stressful life events.Your health care provider will also ask about your medical history and may order blood tests or other tests. Certain medical conditions and medicine can cause physical symptoms similar to stress. Mental illness can cause emotional symptoms and unhealthy behaviors similar to stress. Your health care provider may refer you to a mental health professional for further evaluation. How is this treated? Stress management is the recommended treatment for stress.The goals of stress management are reducing stressful life events and coping with stress in healthy ways. Techniques for reducing stressful life events include the following:  Stress identification. Self-monitor for stress and identify what causes stress for you. These skills may help you to avoid some stressful events.  Time management. Set your priorities, keep a calendar of events, and learn to say "no." These tools can help you avoid making too many commitments.  Techniques for coping with stress include the following:  Rethinking the problem. Try to think realistically about stressful events rather than ignoring them or overreacting. Try to find the positives in a stressful situation rather than focusing on the negatives.  Exercise. Physical exercise can release both physical and emotional tension. The key is to find a form of exercise you enjoy and do it regularly.  Relaxation techniques. These relax the body and mind. Examples include yoga, meditation, tai chi, biofeedback, deep breathing, progressive muscle relaxation, listening to music, being out in nature, journaling, and other hobbies. Again, the key is to find one or more that you enjoy and can do regularly.  Healthy lifestyle. Eat a balanced diet, get plenty of sleep, and do not smoke. Avoid using alcohol or  drugs to relax.  Strong support network. Spend time with family, friends, or other people you enjoy being around.Express your feelings and talk things over with someone you trust.  Counseling or talktherapy with a mental health professional may be helpful if you are having difficulty managing stress on your own. Medicine is typically not recommended for the treatment of stress.Talk to your health care provider if you think you need medicine for symptoms of stress. Follow these instructions at home:  Keep all follow-up visits as directed by your health care provider.  Take all medicines as directed by your health care provider. Contact a health care provider if:  Your symptoms get worse or you start having new symptoms.  You feel overwhelmed by your problems and can no longer manage them on your own. Get help right away if:  You feel like hurting yourself or someone else. This information is not intended to replace advice given to you by your health care provider. Make sure you discuss any questions you have with your health care provider. Document Released: 01/28/2001 Document Revised: 01/10/2016 Document Reviewed: 03/29/2013 Elsevier Interactive Patient Education  2017 [place] K. Avagail Whittlesey M.D.

## 2017-12-11 ENCOUNTER — Other Ambulatory Visit: Payer: Self-pay | Admitting: Internal Medicine

## 2017-12-11 NOTE — Telephone Encounter (Signed)
mychart message sent to patient to schedule OV in May for further refills to be sent.  30 day supply sent to pharmacy.  Nothing further needed.

## 2017-12-11 NOTE — Telephone Encounter (Signed)
Patient last OV was 10/19/2017 and last refill for this Rx is 10/19/2017, patient is requesting refills, please Advise if ok to refill.

## 2017-12-11 NOTE — Telephone Encounter (Signed)
Due for  OV next month   Make sure on schedule and can refill x1 up until then

## 2018-01-02 ENCOUNTER — Other Ambulatory Visit: Payer: Self-pay | Admitting: Internal Medicine

## 2018-02-08 ENCOUNTER — Other Ambulatory Visit: Payer: Self-pay | Admitting: Internal Medicine

## 2018-02-10 NOTE — Telephone Encounter (Signed)
Sent to the pharmacy by e-scribe for 90 days.  Pt due for annual on 04/11/18.

## 2018-03-08 DIAGNOSIS — D225 Melanocytic nevi of trunk: Secondary | ICD-10-CM | POA: Diagnosis not present

## 2018-03-08 DIAGNOSIS — L7211 Pilar cyst: Secondary | ICD-10-CM | POA: Diagnosis not present

## 2018-03-08 DIAGNOSIS — L570 Actinic keratosis: Secondary | ICD-10-CM | POA: Diagnosis not present

## 2018-03-08 DIAGNOSIS — D1801 Hemangioma of skin and subcutaneous tissue: Secondary | ICD-10-CM | POA: Diagnosis not present

## 2018-04-13 ENCOUNTER — Other Ambulatory Visit: Payer: Self-pay

## 2018-04-13 MED ORDER — METOPROLOL SUCCINATE ER 50 MG PO TB24: ORAL_TABLET | ORAL | 0 refills | Status: DC

## 2018-05-08 ENCOUNTER — Other Ambulatory Visit: Payer: Self-pay | Admitting: Internal Medicine

## 2018-05-13 ENCOUNTER — Telehealth: Payer: Self-pay | Admitting: Internal Medicine

## 2018-05-13 NOTE — Telephone Encounter (Signed)
Copied from [place] [id number]. Topic: Quick Communication - See Telephone Encounter >> May 13, 2018  5:19 PM Vernona Rieger wrote: CRM for notification. See Telephone encounter for: 05/13/18.  [facility] called and said that losartan-hydrochlorothiazide (HYZAAR) 50-12.5 MG tablet is on long term back order. Could you give her something else in place of this? She can be reached @ [phone] reference [phone]

## 2018-05-14 NOTE — Telephone Encounter (Signed)
Please advise in PCP absence. Perhaps we can split into separate losartan and HCTZ at same dose?

## 2018-05-17 MED ORDER — LOSARTAN POTASSIUM 50 MG PO TABS: 50.0000 mg | ORAL_TABLET | Freq: Every day | ORAL | 1 refills | Status: DC

## 2018-05-17 MED ORDER — HYDROCHLOROTHIAZIDE 12.5 MG PO TABS: 12.5000 mg | ORAL_TABLET | Freq: Every day | ORAL | 1 refills | Status: DC

## 2018-05-17 NOTE — Telephone Encounter (Signed)
Medication filled to pharmacy as requested.   

## 2018-05-17 NOTE — Telephone Encounter (Signed)
Yes that's the best plan. Call in Losartan 50 mg and HCTZ 12.5 mg to take both daily, #30 with one rf

## 2018-06-09 DIAGNOSIS — M9901 Segmental and somatic dysfunction of cervical region: Secondary | ICD-10-CM | POA: Diagnosis not present

## 2018-06-14 DIAGNOSIS — M9901 Segmental and somatic dysfunction of cervical region: Secondary | ICD-10-CM | POA: Diagnosis not present

## 2018-06-30 ENCOUNTER — Encounter: Payer: 59 | Admitting: Internal Medicine

## 2018-07-04 ENCOUNTER — Other Ambulatory Visit: Payer: Self-pay | Admitting: Internal Medicine

## 2018-07-05 ENCOUNTER — Other Ambulatory Visit: Payer: Self-pay | Admitting: Internal Medicine

## 2018-07-05 NOTE — Telephone Encounter (Signed)
Last refill 01/04/18 Next Appt With Family Medicine Shanon Ace, MD) 07/23/2018 at 8:45 AM  Please advise Dr Regis Bill, thanks.

## 2018-07-05 NOTE — Telephone Encounter (Signed)
Sent in electronically .  

## 2018-07-20 DIAGNOSIS — Z23 Encounter for immunization: Secondary | ICD-10-CM | POA: Diagnosis not present

## 2018-07-21 NOTE — Progress Notes (Signed)
Chief Complaint  Patient presents with  . Annual Exam    No new concerns  . Medication Management  . Hypertension    HPI: Patient  Danielle Martin  57 y.o. comes in today for Preventive Health Care visit  has OBESITY, UNSPECIFIED; Thrombocytosis ([place]); Allergic rhinitis; ASTHMA; OTHER SLEEP DISTURBANCES; SVT (supraventricular tachycardia) ([place]); Neoplasm of uncertain behavior of skin; Visit for preventive health examination; Hypertension; HLD (hyperlipidemia); and Cervical radiculopathy on their problem list.  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  Sleep still issues  Taking  lexapro work days  To help with work anxiety  And off vacation and weekends ?  Seems ok with this.   His iron defi for inc platelets  Iron causes stool issues sot taking MVI   No cv pulm vision hearing  Neuro sx   Not on injalers now   Health Maintenance  Topic Date Due  . HIV Screening  02/22/1978  . MAMMOGRAM  12/24/2016  . TETANUS/TDAP  12/24/2020  . COLONOSCOPY  02/19/2021  . INFLUENZA VACCINE  Completed  . Hepatitis C Screening  Completed   Health Maintenance Review LIFESTYLE:  Exercise:   So little  Tobacco/ETS: no Alcohol:  limted  Sugar beverages: coffee in am  Sleep: ave 5-6  Interrupted . Snores no obv sleep apnea  Drug use: no HH of  2 no pets  Work:   5 50.   ROS:  GEN/ HEENT: No fever, significant weight changes sweats headaches vision problems hearing changes, CV/ PULM; No chest pain shortness of breath cough, syncope,edema  change in exercise tolerance. GI /GU: No adominal pain, vomiting, change in bowel habits. No blood in the stool. No significant GU symptoms. SKIN/HEME: ,no acute skin rashes suspicious lesions or bleeding. No lymphadenopathy, nodules, masses.  NEURO/ PSYCH:  No neurologic signs such as weakness numbness. No depression anxiety. IMM/ Allergy: No unusual infections.  Allergy .   REST of 12 system review negative except as per  HPI   Past Medical History:  Diagnosis Date  . Allergic rhinitis    Worse in winter , grasses  . Asthma    Worse in winter  . Gall stone pancreatitis    lap choley 1 2017  . GERD (gastroesophageal reflux disease)   . YHCWCBJS(283.1)    "bi-weekly" (09/03/2015)  . HEMORRHAGIC CYSTITIS 05/25/2009   Qualifier: Diagnosis of  By: Regis Bill MD, Standley Brooking   . Hypertension   . Iron deficiency anemia 05/01/2015  . Migraine    "q couple months" (09/03/2015)  . Pneumonia    "a few times" (09/03/2015)  . SVT (supraventricular tachycardia) (HCC)    No longer a problem    Past Surgical History:  Procedure Laterality Date  . ANTERIOR CERVICAL DECOMP/DISCECTOMY FUSION  2008  . BACK SURGERY    . CESAREAN SECTION  1990; 1991  . CHOLECYSTECTOMY N/A 09/04/2015   Procedure: LAPAROSCOPIC CHOLECYSTECTOMY WITH INTRAOPERATIVE CHOLANGIOGRAM;  Surgeon: Greer Pickerel, MD;  Location: [facility];  Service: General;  Laterality: N/A;  . OOPHORECTOMY  1980s   "all of the right; part of the left"  . VAGINAL HYSTERECTOMY  2010    Family History  Problem Relation Age of Onset  . Other Mother        Died of asphyxiation  . Prostate cancer Father     Social History   Socioeconomic History  . Marital status: Married    Spouse name: Not on file  .  Number of children: Not on file  . Years of education: Not on file  . Highest education level: Not on file  Occupational History  . Not on file  Social Needs  . Financial resource strain: Not on file  . Food insecurity:    Worry: Not on file    Inability: Not on file  . Transportation needs:    Medical: Not on file    Non-medical: Not on file  Tobacco Use  . Smoking status: Never Smoker  . Smokeless tobacco: Never Used  . Tobacco comment: "stopped smoking in the 1980s"  Substance and Sexual Activity  . Alcohol use: Yes    Alcohol/week: 8.0 standard drinks    Types: 4 Glasses of wine, 4 Shots of liquor per week  . Drug use: No  . Sexual activity: Yes   Lifestyle  . Physical activity:    Days per week: Not on file    Minutes per session: Not on file  . Stress: Not on file  Relationships  . Social connections:    Talks on phone: Not on file    Gets together: Not on file    Attends religious service: Not on file    Active member of club or organization: Not on file    Attends meetings of clubs or organizations: Not on file    Relationship status: Not on file  Other Topics Concern  . 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 albuterol (PROVENTIL HFA;VENTOLIN HFA) 108 (90 BASE) MCG/ACT inhaler Inhale 2 puffs into the lungs every 6 (six) hours as needed for wheezing.    Marland Kitchen albuterol (PROVENTIL) (2.5 MG/3ML) 0.083% nebulizer solution Take 3 mLs (2.5 mg total) by nebulization every 6 (six) hours as needed for wheezing or shortness of breath. 150 mL 1  . Calcium Carbonate-Vitamin D 600-400 MG-UNIT per chew tablet Chew 1 tablet by mouth daily.    Marland Kitchen CINNAMON PO Take 1,000 Units by mouth every morning.    . escitalopram (LEXAPRO) 10 MG tablet TAKE 1 TABLET BY MOUTH EVERY DAY 90 tablet 1  . hydrochlorothiazide (HYDRODIURIL) 12.5 MG tablet Take 1 tablet (12.5 mg total) by mouth daily. 30 tablet 1  . LORazepam (ATIVAN) 0.5 MG tablet 1 po if needed,  Up to  Every  8 hours  For anxiety 20 tablet 0  . losartan (COZAAR) 50 MG tablet Take 1 tablet (50 mg total) by mouth daily. 30 tablet 1  . metoprolol succinate (TOPROL-XL) 50 MG 24 hr tablet TAKE 1 TABLET DAILY. TAKE  WITH OR IMMEDIATELY        FOLLOWING A MEAL. (DUE     FOR PHYSICAL) 30 tablet 0  . Multiple Vitamin (MULTI-VITAMIN PO) Take 1 tablet by mouth daily.     . vitamin E 400 UNIT capsule Take 400 Units by mouth  daily.    Marland Kitchen losartan-hydrochlorothiazide (HYZAAR) 50-12.5 MG tablet Take 1 tablet by mouth daily. **DUE FOR ANNUAL VISIT FOR FURTHER REFILLS 30 tablet 0  . ADVAIR DISKUS 100-50 MCG/DOSE AEPB USE ONE INHALATION INTO THE LUNGS TWO TIMES A  DAY OR AS DIRECTED (Patient not taking: Reported on 07/23/2018) 3 each 1  . fluticasone (FLONASE) 50 MCG/ACT nasal spray Place 2 sprays into both nostrils daily. 2 each nostril 48 g 3   No facility-administered medications prior to visit.      EXAM:  BP 118/82 (BP Location: Right Arm, Patient Position: Sitting, Cuff Size: Normal)   Pulse 78   Temp 97.6 F (36.4 C) (Oral)   Ht 5' 4"  (1.626 m)   Wt 260 lb 6.4 oz (118.1 kg)   BMI 44.70 kg/m   Body mass index is 44.7 kg/m. Wt Readings from Last 3 Encounters:  07/23/18 260 lb 6.4 oz (118.1 kg)  10/19/17 255 lb (115.7 kg)  10/14/17 246 lb (111.6 kg)    Physical Exam: Vital signs reviewed EFE:OFHQ is a well-developed well-nourished alert cooperative    who appearsr stated age in no acute distress.  HEENT: normocephalic atraumatic , Eyes: PERRL EOM's full, conjunctiva clear, Nares: paten,t no deformity discharge or tenderness., Ears: no deformity EAC's clear TMs with normal landmarks. Mouth: clear OP, no lesions, edema.  Moist mucous membranes. Dentition in adequate repair. NECK: supple without masses, thyromegaly or bruits. CHEST/PULM:  Clear to auscultation and percussion breath sounds equal no wheeze , rales or rhonchi. No chest wall deformities or tenderness. Breast: normal by inspection . No dimpling, discharge, masses, tenderness or discharge . CV: PMI is nondisplaced, S1 S2 no gallops, murmurs, rubs. Peripheral pulses are full without delay.No JVD .  ABDOMEN: Bowel sounds normal nontender  No guard or rebound, no hepato splenomegal no CVA tenderness.  Extremtities:  No clubbing cyanosis or edema, no acute joint swelling or redness no focal atrophy NEURO:  Oriented x3, cranial nerves 3-12 appear to  be intact, no obvious focal weakness,gait within normal limits no abnormal reflexes or asymmetrical SKIN: No acute rashes normal turgor, color, no bruising or petechiae. PSYCH: Oriented, good eye contact, no obvious depression anxiety, cognition and judgment appear normal. LN: no cervical axillary inguinal adenopathy  Lab Results  Component Value Date   WBC 9.0 10/14/2017   HGB 12.7 10/14/2017   HCT 40.1 10/14/2017   PLT 415 (H) 10/14/2017   GLUCOSE 114 (H) 10/14/2017   CHOL 203 (H) 04/10/2017   TRIG 207.0 (H) 04/10/2017   HDL 39.30 04/10/2017   LDLDIRECT 123.0 04/10/2017   LDLCALC 95 11/13/2014   ALT 17 04/10/2017   AST 17 04/10/2017   NA 139 10/14/2017   K 3.5 10/14/2017   CL 99 (L) 10/14/2017   CREATININE 0.71 10/14/2017   BUN 19 10/14/2017   CO2 28 10/14/2017   TSH 1.11 04/10/2017   INR 1.0 01/24/2009   HGBA1C 5.9 10/01/2015    BP Readings from Last 3 Encounters:  07/23/18 118/82  10/19/17 122/82  10/14/17 131/80    Labplan   ASSESSMENT AND PLAN:  Discussed the following assessment and plan:  Visit for preventive health examination - contact for shingrix vaccine whe avaialbe  - Plan: Basic metabolic panel, CBC with Differential/Platelet, Hemoglobin A1c, Hepatic function panel, Lipid panel, TSH, Iron, TIBC and Ferritin Panel  Medication management - Plan: Basic metabolic panel, CBC with Differential/Platelet, Hemoglobin A1c, Hepatic function panel, Lipid panel, TSH, Iron, TIBC and Ferritin Panel  Hyperlipidemia, unspecified hyperlipidemia type - Plan: Basic metabolic panel, CBC with Differential/Platelet, Hemoglobin A1c, Hepatic function panel, Lipid panel, TSH, Iron, TIBC and Ferritin Panel  Thrombocytosis (HCC) - Plan: Basic metabolic panel, CBC with Differential/Platelet, Hemoglobin A1c, Hepatic function panel, Lipid panel, TSH, Iron,  TIBC and Ferritin Panel  Hyperglycemia - Plan: Basic metabolic panel, CBC with Differential/Platelet, Hemoglobin A1c, Hepatic  function panel, Lipid panel, TSH, Iron, TIBC and Ferritin Panel  Essential hypertension - Plan: Basic metabolic panel, CBC with Differential/Platelet, Hemoglobin A1c, Hepatic function panel, Lipid panel, TSH, Iron, TIBC and Ferritin Panel  BMI 40.0-44.9, adult (HCC)  Snoring - look for sx of sleep apnes  pt weill let us know  Disc use of lexapro med  Not off nad on  Eight low dose 5 or dec to 5 on weekends to avoid rebound sx .  Counseled.  Patient Care Team: Panosh, Standley Brooking, MD as PCP - General Arvella Nigh, MD (Obstetrics and Gynecology) Marybelle Killings, MD as Attending Physician (Orthopedic Surgery) Patient Instructions  Work on sleep hygiene as discussed  Checking iron as well as sugar and lipid s  Watch out for signs of sleep apnea   Preventive Care 40-64 Years, Female Preventive care refers to lifestyle choices and visits with your health care provider that can promote health and wellness. What does preventive care include?  A yearly physical exam. This is also called an annual well check.  Dental exams once or twice a year.  Routine eye exams. Ask your health care provider how often you should have your eyes checked.  Personal lifestyle choices, including: ? Daily care of your teeth and gums. ? Regular physical activity. ? Eating a healthy diet. ? Avoiding tobacco and drug use. ? Limiting alcohol use. ? Practicing safe sex. ? Taking low-dose aspirin daily starting at age 84. ? Taking vitamin and mineral supplements as recommended by your health care provider. What happens during an annual well check? The services and screenings done by your health care provider during your annual well check will depend on your age, overall health, lifestyle risk factors, and family history of disease. Counseling Your health care provider may ask you questions about your:  Alcohol use.  Tobacco use.  Drug use.  Emotional well-being.  Home and relationship well-being.  Sexual  activity.  Eating habits.  Work and work Statistician.  Method of birth control.  Menstrual cycle.  Pregnancy history.  Screening You may have the following tests or measurements:  Height, weight, and BMI.  Blood pressure.  Lipid and cholesterol levels. These may be checked every 5 years, or more frequently if you are over 58 years old.  Skin check.  Lung cancer screening. You may have this screening every year starting at age 102 if you have a 30-pack-year history of smoking and currently smoke or have quit within the past 15 years.  Fecal occult blood test (FOBT) of the stool. You may have this test every year starting at age 85.  Flexible sigmoidoscopy or colonoscopy. You may have a sigmoidoscopy every 5 years or a colonoscopy every 10 years starting at age 4.  Hepatitis C blood test.  Hepatitis B blood test.  Sexually transmitted disease (STD) testing.  Diabetes screening. This is done by checking your blood sugar (glucose) after you have not eaten for a while (fasting). You may have this done every 1-3 years.  Mammogram. This may be done every 1-2 years. Talk to your health care provider about when you should start having regular mammograms. This may depend on whether you have a family history of breast cancer.  BRCA-related cancer screening. This may be done if you have a family history of breast, ovarian, tubal, or peritoneal cancers.  Pelvic exam and Pap test. This may  be done every 3 years starting at age 50. Starting at age 2, this may be done every 5 years if you have a Pap test in combination with an HPV test.  Bone density scan. This is done to screen for osteoporosis. You may have this scan if you are at high risk for osteoporosis.  Discuss your test results, treatment options, and if necessary, the need for more tests with your health care provider. Vaccines Your health care provider may recommend certain vaccines, such as:  Influenza vaccine. This is  recommended every year.  Tetanus, diphtheria, and acellular pertussis (Tdap, Td) vaccine. You may need a Td booster every 10 years.  Varicella vaccine. You may need this if you have not been vaccinated.  Zoster vaccine. You may need this after age 73.  Measles, mumps, and rubella (MMR) vaccine. You may need at least one dose of MMR if you were born in 1957 or later. You may also need a second dose.  Pneumococcal 13-valent conjugate (PCV13) vaccine. You may need this if you have certain conditions and were not previously vaccinated.  Pneumococcal polysaccharide (PPSV23) vaccine. You may need one or two doses if you smoke cigarettes or if you have certain conditions.  Meningococcal vaccine. You may need this if you have certain conditions.  Hepatitis A vaccine. You may need this if you have certain conditions or if you travel or work in places where you may be exposed to hepatitis A.  Hepatitis B vaccine. You may need this if you have certain conditions or if you travel or work in places where you may be exposed to hepatitis B.  Haemophilus influenzae type b (Hib) vaccine. You may need this if you have certain conditions.  Talk to your health care provider about which screenings and vaccines you need and how often you need them. This information is not intended to replace advice given to you by your health care provider. Make sure you discuss any questions you have with your health care provider. Document Released: 08/31/2015 Document Revised: 04/23/2016 Document Reviewed: 06/05/2015 Elsevier Interactive Patient Education  2018 [employer]. Insomnia Insomnia is a sleep disorder that makes it difficult to fall asleep or to stay asleep. Insomnia can cause tiredness (fatigue), low energy, difficulty concentrating, mood swings, and poor performance at work or school. There are three different ways to classify insomnia:  Difficulty falling asleep.  Difficulty staying asleep.  Waking up  too early in the morning.  Any type of insomnia can be long-term (chronic) or short-term (acute). Both are common. Short-term insomnia usually lasts for three months or less. Chronic insomnia occurs at least three times a week for longer than three months. What are the causes? Insomnia may be caused by another condition, situation, or substance, such as:  Anxiety.  Certain medicines.  Gastroesophageal reflux disease (GERD) or other gastrointestinal conditions.  Asthma or other breathing conditions.  Restless legs syndrome, sleep apnea, or other sleep disorders.  Chronic pain.  Menopause. This may include hot flashes.  Stroke.  Abuse of alcohol, tobacco, or illegal drugs.  Depression.  Caffeine.  Neurological disorders, such as Alzheimer disease.  An overactive thyroid (hyperthyroidism).  The cause of insomnia may not be known. What increases the risk? Risk factors for insomnia include:  Gender. Women are more commonly affected than men.  Age. Insomnia is more common as you get older.  Stress. This may involve your professional or personal life.  Income. Insomnia is more common in people with lower income.  Lack of exercise.  Irregular work schedule or night shifts.  Traveling between different time zones.  What are the signs or symptoms? If you have insomnia, trouble falling asleep or trouble staying asleep is the main symptom. This may lead to other symptoms, such as:  Feeling fatigued.  Feeling nervous about going to sleep.  Not feeling rested in the morning.  Having trouble concentrating.  Feeling irritable, anxious, or depressed.  How is this treated? Treatment for insomnia depends on the cause. If your insomnia is caused by an underlying condition, treatment will focus on addressing the condition. Treatment may also include:  Medicines to help you sleep.  Counseling or therapy.  Lifestyle adjustments.  Follow these instructions at  home:  Take medicines only as directed by your health care provider.  Keep regular sleeping and waking hours. Avoid naps.  Keep a sleep diary to help you and your health care provider figure out what could be causing your insomnia. Include: ? When you sleep. ? When you wake up during the night. ? How well you sleep. ? How rested you feel the next day. ? Any side effects of medicines you are taking. ? What you eat and drink.  Make your bedroom a comfortable place where it is easy to fall asleep: ? Put up shades or special blackout curtains to block light from outside. ? Use a white noise machine to block noise. ? Keep the temperature cool.  Exercise regularly as directed by your health care provider. Avoid exercising right before bedtime.  Use relaxation techniques to manage stress. Ask your health care provider to suggest some techniques that may work well for you. These may include: ? Breathing exercises. ? Routines to release muscle tension. ? Visualizing peaceful scenes.  Cut back on alcohol, caffeinated beverages, and cigarettes, especially close to bedtime. These can disrupt your sleep.  Do not overeat or eat spicy foods right before bedtime. This can lead to digestive discomfort that can make it hard for you to sleep.  Limit screen use before bedtime. This includes: ? Watching TV. ? Using your smartphone, tablet, and computer.  Stick to a routine. This can help you fall asleep faster. Try to do a quiet activity, brush your teeth, and go to bed at the same time each night.  Get out of bed if you are still awake after 15 minutes of trying to sleep. Keep the lights down, but try reading or doing a quiet activity. When you feel sleepy, go back to bed.  Make sure that you drive carefully. Avoid driving if you feel very sleepy.  Keep all follow-up appointments as directed by your health care provider. This is important. Contact a health care provider if:  You are tired  throughout the day or have trouble in your daily routine due to sleepiness.  You continue to have sleep problems or your sleep problems get worse. Get help right away if:  You have serious thoughts about hurting yourself or someone else. This information is not intended to replace advice given to you by your health care provider. Make sure you discuss any questions you have with your health care provider. Document Released: 08/01/2000 Document Revised: 01/04/2016 Document Reviewed: 05/05/2014 Elsevier Interactive Patient Education  2018 [place]. Panosh M.D.

## 2018-07-23 ENCOUNTER — Encounter: Payer: Self-pay | Admitting: Internal Medicine

## 2018-07-23 ENCOUNTER — Ambulatory Visit (INDEPENDENT_AMBULATORY_CARE_PROVIDER_SITE_OTHER): Payer: 59 | Admitting: Internal Medicine

## 2018-07-23 VITALS — BP 118/82 | HR 78 | Temp 97.6°F | Ht 64.0 in | Wt 260.4 lb

## 2018-07-23 DIAGNOSIS — I10 Essential (primary) hypertension: Secondary | ICD-10-CM

## 2018-07-23 DIAGNOSIS — Z0000 Encounter for general adult medical examination without abnormal findings: Secondary | ICD-10-CM

## 2018-07-23 DIAGNOSIS — E785 Hyperlipidemia, unspecified: Secondary | ICD-10-CM | POA: Diagnosis not present

## 2018-07-23 DIAGNOSIS — D473 Essential (hemorrhagic) thrombocythemia: Secondary | ICD-10-CM | POA: Diagnosis not present

## 2018-07-23 DIAGNOSIS — R739 Hyperglycemia, unspecified: Secondary | ICD-10-CM

## 2018-07-23 DIAGNOSIS — D75839 Thrombocytosis, unspecified: Secondary | ICD-10-CM

## 2018-07-23 DIAGNOSIS — Z79899 Other long term (current) drug therapy: Secondary | ICD-10-CM | POA: Diagnosis not present

## 2018-07-23 DIAGNOSIS — Z6841 Body Mass Index (BMI) 40.0 and over, adult: Secondary | ICD-10-CM

## 2018-07-23 DIAGNOSIS — R0683 Snoring: Secondary | ICD-10-CM

## 2018-07-23 LAB — CBC WITH DIFFERENTIAL/PLATELET
BASOS PCT: 0.9 % (ref 0.0–3.0)
Basophils Absolute: 0.1 10*3/uL (ref 0.0–0.1)
EOS PCT: 3.4 % (ref 0.0–5.0)
Eosinophils Absolute: 0.2 10*3/uL (ref 0.0–0.7)
HCT: 41.7 % (ref 36.0–46.0)
Hemoglobin: 14.1 g/dL (ref 12.0–15.0)
Lymphocytes Relative: 20.9 % (ref 12.0–46.0)
Lymphs Abs: 1.5 10*3/uL (ref 0.7–4.0)
MCHC: 33.7 g/dL (ref 30.0–36.0)
MCV: 84.8 fl (ref 78.0–100.0)
MONOS PCT: 7.6 % (ref 3.0–12.0)
Monocytes Absolute: 0.5 10*3/uL (ref 0.1–1.0)
NEUTROS ABS: 4.7 10*3/uL (ref 1.4–7.7)
Neutrophils Relative %: 67.2 % (ref 43.0–77.0)
PLATELETS: 452 10*3/uL — AB (ref 150.0–400.0)
RBC: 4.92 Mil/uL (ref 3.87–5.11)
RDW: 14.5 % (ref 11.5–15.5)
WBC: 7 10*3/uL (ref 4.0–10.5)

## 2018-07-23 LAB — BASIC METABOLIC PANEL
BUN: 21 mg/dL (ref 6–23)
CO2: 32 meq/L (ref 19–32)
Calcium: 9.2 mg/dL (ref 8.4–10.5)
Chloride: 99 mEq/L (ref 96–112)
Creatinine, Ser: 0.62 mg/dL (ref 0.40–1.20)
GFR: 106.06 mL/min (ref >60.00)
Glucose, Bld: 100 mg/dL — ABNORMAL HIGH (ref 70–99)
Potassium: 4.3 mEq/L (ref 3.5–5.1)
SODIUM: 139 meq/L (ref 135–145)

## 2018-07-23 LAB — HEPATIC FUNCTION PANEL
ALBUMIN: 4.3 g/dL (ref 3.5–5.2)
ALK PHOS: 43 U/L (ref 39–117)
ALT: 18 U/L (ref 0–35)
AST: 18 U/L (ref 0–37)
Bilirubin, Direct: 0.1 mg/dL (ref 0.0–0.3)
TOTAL PROTEIN: 7.2 g/dL (ref 6.0–8.3)
Total Bilirubin: 0.7 mg/dL (ref 0.2–1.2)

## 2018-07-23 LAB — LDL CHOLESTEROL, DIRECT: Direct LDL: 115 mg/dL

## 2018-07-23 LAB — HEMOGLOBIN A1C: Hgb A1c MFr Bld: 5.8 % (ref 4.6–6.5)

## 2018-07-23 LAB — LIPID PANEL
Cholesterol: 180 mg/dL (ref 0–200)
HDL: 44 mg/dL (ref >39.00)
NonHDL: 135.93
Total CHOL/HDL Ratio: 4
Triglycerides: 209 mg/dL — ABNORMAL HIGH (ref 0.0–149.0)
VLDL: 41.8 mg/dL — ABNORMAL HIGH (ref 0.0–40.0)

## 2018-07-23 LAB — TSH: TSH: 1.41 u[IU]/mL (ref 0.35–4.50)

## 2018-07-23 NOTE — Patient Instructions (Addendum)
Work on sleep hygiene as discussed  Checking iron as well as sugar and lipid s  Watch out for signs of sleep apnea   Preventive Care 40-64 Years, Female Preventive care refers to lifestyle choices and visits with your health care provider that can promote health and wellness. What does preventive care include?  A yearly physical exam. This is also called an annual well check.  Dental exams once or twice a year.  Routine eye exams. Ask your health care provider how often you should have your eyes checked.  Personal lifestyle choices, including: ? Daily care of your teeth and gums. ? Regular physical activity. ? Eating a healthy diet. ? Avoiding tobacco and drug use. ? Limiting alcohol use. ? Practicing safe sex. ? Taking low-dose aspirin daily starting at age 58. ? Taking vitamin and mineral supplements as recommended by your health care provider. What happens during an annual well check? The services and screenings done by your health care provider during your annual well check will depend on your age, overall health, lifestyle risk factors, and family history of disease. Counseling Your health care provider may ask you questions about your:  Alcohol use.  Tobacco use.  Drug use.  Emotional well-being.  Home and relationship well-being.  Sexual activity.  Eating habits.  Work and work Statistician.  Method of birth control.  Menstrual cycle.  Pregnancy history.  Screening You may have the following tests or measurements:  Height, weight, and BMI.  Blood pressure.  Lipid and cholesterol levels. These may be checked every 5 years, or more frequently if you are over 37 years old.  Skin check.  Lung cancer screening. You may have this screening every year starting at age 58 if you have a 30-pack-year history of smoking and currently smoke or have quit within the past 15 years.  Fecal occult blood test (FOBT) of the stool. You may have this test every year  starting at age 65.  Flexible sigmoidoscopy or colonoscopy. You may have a sigmoidoscopy every 5 years or a colonoscopy every 10 years starting at age 37.  Hepatitis C blood test.  Hepatitis B blood test.  Sexually transmitted disease (STD) testing.  Diabetes screening. This is done by checking your blood sugar (glucose) after you have not eaten for a while (fasting). You may have this done every 1-3 years.  Mammogram. This may be done every 1-2 years. Talk to your health care provider about when you should start having regular mammograms. This may depend on whether you have a family history of breast cancer.  BRCA-related cancer screening. This may be done if you have a family history of breast, ovarian, tubal, or peritoneal cancers.  Pelvic exam and Pap test. This may be done every 3 years starting at age 73. Starting at age 73, this may be done every 5 years if you have a Pap test in combination with an HPV test.  Bone density scan. This is done to screen for osteoporosis. You may have this scan if you are at high risk for osteoporosis.  Discuss your test results, treatment options, and if necessary, the need for more tests with your health care provider. Vaccines Your health care provider may recommend certain vaccines, such as:  Influenza vaccine. This is recommended every year.  Tetanus, diphtheria, and acellular pertussis (Tdap, Td) vaccine. You may need a Td booster every 10 years.  Varicella vaccine. You may need this if you have not been vaccinated.  Zoster vaccine. You may  need this after age 58.  Measles, mumps, and rubella (MMR) vaccine. You may need at least one dose of MMR if you were born in 1957 or later. You may also need a second dose.  Pneumococcal 13-valent conjugate (PCV13) vaccine. You may need this if you have certain conditions and were not previously vaccinated.  Pneumococcal polysaccharide (PPSV23) vaccine. You may need one or two doses if you smoke  cigarettes or if you have certain conditions.  Meningococcal vaccine. You may need this if you have certain conditions.  Hepatitis A vaccine. You may need this if you have certain conditions or if you travel or work in places where you may be exposed to hepatitis A.  Hepatitis B vaccine. You may need this if you have certain conditions or if you travel or work in places where you may be exposed to hepatitis B.  Haemophilus influenzae type b (Hib) vaccine. You may need this if you have certain conditions.  Talk to your health care provider about which screenings and vaccines you need and how often you need them. This information is not intended to replace advice given to you by your health care provider. Make sure you discuss any questions you have with your health care provider. Document Released: 08/31/2015 Document Revised: 04/23/2016 Document Reviewed: 06/05/2015 Elsevier Interactive Patient Education  2018 [employer]. Insomnia Insomnia is a sleep disorder that makes it difficult to fall asleep or to stay asleep. Insomnia can cause tiredness (fatigue), low energy, difficulty concentrating, mood swings, and poor performance at work or school. There are three different ways to classify insomnia:  Difficulty falling asleep.  Difficulty staying asleep.  Waking up too early in the morning.  Any type of insomnia can be long-term (chronic) or short-term (acute). Both are common. Short-term insomnia usually lasts for three months or less. Chronic insomnia occurs at least three times a week for longer than three months. What are the causes? Insomnia may be caused by another condition, situation, or substance, such as:  Anxiety.  Certain medicines.  Gastroesophageal reflux disease (GERD) or other gastrointestinal conditions.  Asthma or other breathing conditions.  Restless legs syndrome, sleep apnea, or other sleep disorders.  Chronic pain.  Menopause. This may include hot  flashes.  Stroke.  Abuse of alcohol, tobacco, or illegal drugs.  Depression.  Caffeine.  Neurological disorders, such as Alzheimer disease.  An overactive thyroid (hyperthyroidism).  The cause of insomnia may not be known. What increases the risk? Risk factors for insomnia include:  Gender. Women are more commonly affected than men.  Age. Insomnia is more common as you get older.  Stress. This may involve your professional or personal life.  Income. Insomnia is more common in people with lower income.  Lack of exercise.  Irregular work schedule or night shifts.  Traveling between different time zones.  What are the signs or symptoms? If you have insomnia, trouble falling asleep or trouble staying asleep is the main symptom. This may lead to other symptoms, such as:  Feeling fatigued.  Feeling nervous about going to sleep.  Not feeling rested in the morning.  Having trouble concentrating.  Feeling irritable, anxious, or depressed.  How is this treated? Treatment for insomnia depends on the cause. If your insomnia is caused by an underlying condition, treatment will focus on addressing the condition. Treatment may also include:  Medicines to help you sleep.  Counseling or therapy.  Lifestyle adjustments.  Follow these instructions at home:  Take medicines only as directed  by your health care provider.  Keep regular sleeping and waking hours. Avoid naps.  Keep a sleep diary to help you and your health care provider figure out what could be causing your insomnia. Include: ? When you sleep. ? When you wake up during the night. ? How well you sleep. ? How rested you feel the next day. ? Any side effects of medicines you are taking. ? What you eat and drink.  Make your bedroom a comfortable place where it is easy to fall asleep: ? Put up shades or special blackout curtains to block light from outside. ? Use a white noise machine to block noise. ? Keep  the temperature cool.  Exercise regularly as directed by your health care provider. Avoid exercising right before bedtime.  Use relaxation techniques to manage stress. Ask your health care provider to suggest some techniques that may work well for you. These may include: ? Breathing exercises. ? Routines to release muscle tension. ? Visualizing peaceful scenes.  Cut back on alcohol, caffeinated beverages, and cigarettes, especially close to bedtime. These can disrupt your sleep.  Do not overeat or eat spicy foods right before bedtime. This can lead to digestive discomfort that can make it hard for you to sleep.  Limit screen use before bedtime. This includes: ? Watching TV. ? Using your smartphone, tablet, and computer.  Stick to a routine. This can help you fall asleep faster. Try to do a quiet activity, brush your teeth, and go to bed at the same time each night.  Get out of bed if you are still awake after 15 minutes of trying to sleep. Keep the lights down, but try reading or doing a quiet activity. When you feel sleepy, go back to bed.  Make sure that you drive carefully. Avoid driving if you feel very sleepy.  Keep all follow-up appointments as directed by your health care provider. This is important. Contact a health care provider if:  You are tired throughout the day or have trouble in your daily routine due to sleepiness.  You continue to have sleep problems or your sleep problems get worse. Get help right away if:  You have serious thoughts about hurting yourself or someone else. This information is not intended to replace advice given to you by your health care provider. Make sure you discuss any questions you have with your health care provider. Document Released: 08/01/2000 Document Revised: 01/04/2016 Document Reviewed: 05/05/2014 Elsevier Interactive Patient Education  [employer].

## 2018-07-24 LAB — IRON,TIBC AND FERRITIN PANEL
%SAT: 18 % (calc) (ref 16–45)
Ferritin: 33 ng/mL (ref 16–232)
Iron: 60 ug/dL (ref 45–160)
TIBC: 336 mcg/dL (calc) (ref 250–450)

## 2018-07-26 ENCOUNTER — Other Ambulatory Visit: Payer: Self-pay | Admitting: Internal Medicine

## 2018-07-26 DIAGNOSIS — Z1231 Encounter for screening mammogram for malignant neoplasm of breast: Secondary | ICD-10-CM

## 2018-07-27 ENCOUNTER — Other Ambulatory Visit: Payer: Self-pay | Admitting: Internal Medicine

## 2018-07-28 ENCOUNTER — Ambulatory Visit
Admission: RE | Admit: 2018-07-28 | Discharge: 2018-07-28 | Disposition: A | Discharge status: Home / Self Care | Payer: 59 | Source: Ambulatory Visit

## 2018-07-28 DIAGNOSIS — Z1231 Encounter for screening mammogram for malignant neoplasm of breast: Secondary | ICD-10-CM | POA: Diagnosis not present

## 2018-08-10 ENCOUNTER — Other Ambulatory Visit: Payer: Self-pay

## 2018-08-14 ENCOUNTER — Telehealth: Payer: 59 | Admitting: Nurse Practitioner

## 2018-08-14 DIAGNOSIS — B3731 Acute candidiasis of vulva and vagina: Secondary | ICD-10-CM

## 2018-08-14 DIAGNOSIS — B373 Candidiasis of vulva and vagina: Secondary | ICD-10-CM

## 2018-08-14 MED ORDER — FLUCONAZOLE 150 MG PO TABS: 150.0000 mg | ORAL_TABLET | Freq: Once | ORAL | 0 refills | Status: AC

## 2018-08-14 NOTE — Progress Notes (Signed)

## 2018-08-19 ENCOUNTER — Telehealth: Payer: 59 | Admitting: Family

## 2018-08-19 DIAGNOSIS — B373 Candidiasis of vulva and vagina: Secondary | ICD-10-CM

## 2018-08-19 DIAGNOSIS — B3731 Acute candidiasis of vulva and vagina: Secondary | ICD-10-CM

## 2018-08-19 MED ORDER — FLUCONAZOLE 150 MG PO TABS: 150.0000 mg | ORAL_TABLET | Freq: Once | ORAL | 0 refills | Status: AC

## 2018-08-19 NOTE — Progress Notes (Signed)
Thank you for the details you included in the comment boxes. Those details are very helpful in determining the best course of treatment for you and help [place] to provide the best care. If this does not improve in 48 hours, please be seen in person.  We are sorry that you are not feeling well. Here is how we plan to help! Based on what you shared with me it looks like you: May have a yeast vaginosis  Vaginosis is an inflammation of the vagina that can result in discharge, itching and pain. The cause is usually a change in the normal balance of vaginal bacteria or an infection. Vaginosis can also result from reduced estrogen levels after menopause.  The most common causes of vaginosis are:   Bacterial vaginosis which results from an overgrowth of one on several organisms that are normally present in your vagina.   Yeast infections which are caused by a naturally occurring fungus called candida.   Vaginal atrophy (atrophic vaginosis) which results from the thinning of the vagina from reduced estrogen levels after menopause.   Trichomoniasis which is caused by a parasite and is commonly transmitted by sexual intercourse.  Factors that increase your risk of developing vaginosis include: Marland Kitchen Medications, such as antibiotics and steroids . Uncontrolled diabetes . Use of hygiene products such as bubble bath, vaginal spray or vaginal deodorant . Douching . Wearing damp or tight-fitting clothing . Using an intrauterine device (IUD) for birth control . Hormonal changes, such as those associated with pregnancy, birth control pills or menopause . Sexual activity . Having a sexually transmitted infection  Your treatment plan is A single Diflucan (fluconazole) 150mg  tablet once.  I have electronically sent this prescription into the pharmacy that you have chosen.  Be sure to take all of the medication as directed. Stop taking any medication if you develop a rash, tongue swelling or shortness of breath.  Mothers who are breast feeding should consider pumping and discarding their breast milk while on these antibiotics. However, there is no consensus that infant exposure at these doses would be harmful.  Remember that medication creams can weaken latex condoms. Marland Kitchen   HOME CARE:  Good hygiene may prevent some types of vaginosis from recurring and may relieve some symptoms:  . Avoid baths, hot tubs and whirlpool spas. Rinse soap from your outer genital area after a shower, and dry the area well to prevent irritation. Don't use scented or harsh soaps, such as those with deodorant or antibacterial action. Marland Kitchen Avoid irritants. These include scented tampons and pads. . Wipe from front to back after using the toilet. Doing so avoids spreading fecal bacteria to your vagina.  Other things that may help prevent vaginosis include:  Marland Kitchen Don't douche. Your vagina doesn't require cleansing other than normal bathing. Repetitive douching disrupts the normal organisms that reside in the vagina and can actually increase your risk of vaginal infection. Douching won't clear up a vaginal infection. . Use a latex condom. Both female and female latex condoms may help you avoid infections spread by sexual contact. . Wear cotton underwear. Also wear pantyhose with a cotton crotch. If you feel comfortable without it, skip wearing underwear to bed. Yeast thrives in [employer] Your symptoms should improve in the next day or two.  GET HELP RIGHT AWAY IF:  . You have pain in your lower abdomen ( pelvic area or over your ovaries) . You develop nausea or vomiting . You develop a fever . Your discharge changes  or worsens . You have persistent pain with intercourse . You develop shortness of breath, a rapid pulse, or you faint.  These symptoms could be signs of problems or infections that need to be evaluated by a medical provider now.  MAKE SURE YOU    Understand these instructions.  Will watch your  condition.  Will get help right away if you are not doing well or get worse.  Your e-visit answers were reviewed by a board certified advanced clinical practitioner to complete your personal care plan. Depending upon the condition, your plan could have included both over the counter or prescription medications. Please review your pharmacy choice to make sure that you have choses a pharmacy that is open for you to pick up any needed prescription, Your safety is important to [place]. If you have drug allergies check your prescription carefully.   You can use MyChart to ask questions about today's visit, request a non-urgent call back, or ask for a work or school excuse for 24 hours related to this e-Visit. If it has been greater than 24 hours you will need to follow up with your provider, or enter a new e-Visit to address those concerns. You will get a MyChart message within the next two days asking about your experience. I hope that your e-visit has been valuable and will speed your recovery.

## 2018-09-20 ENCOUNTER — Other Ambulatory Visit: Payer: Self-pay | Admitting: Internal Medicine

## 2018-09-21 ENCOUNTER — Other Ambulatory Visit: Payer: Self-pay

## 2018-09-21 MED ORDER — HYDROCHLOROTHIAZIDE 12.5 MG PO TABS: 12.5000 mg | ORAL_TABLET | Freq: Every day | ORAL | 1 refills | Status: DC

## 2018-09-30 ENCOUNTER — Other Ambulatory Visit: Payer: Self-pay | Admitting: Internal Medicine

## 2018-10-18 ENCOUNTER — Other Ambulatory Visit: Payer: Self-pay | Admitting: Internal Medicine

## 2018-10-19 ENCOUNTER — Other Ambulatory Visit: Payer: Self-pay | Admitting: Internal Medicine

## 2018-10-27 ENCOUNTER — Telehealth: Payer: Self-pay | Admitting: Internal Medicine

## 2018-10-27 NOTE — Telephone Encounter (Signed)
Would be nice if I knew what alternatives are available.    rx candesartan  hctz  1 po qd pended  Tell patient if too strong ( ie low bp) then can split it in half   For half dose .   [place] send in to the correct pharmacy or mail away

## 2018-10-27 NOTE — Telephone Encounter (Signed)
Copied from [place] [phone]. Topic: Quick Communication - See Telephone Encounter >> Oct 27, 2018 12:32 PM Rutherford Nail, NT wrote: CRM for notification. See Telephone encounter for: 10/27/18. Aarti with [facility] calling and states that the prescription for losartan-hydrochlorothiazide (HYZAAR) 50-12.5 MG tablet is on a manufacture back order. States that an alternative is needed by 10/29/2018 or the prescription will be returned to the patient. Please advise.  CB#: [id number] ref#: [id number]

## 2018-10-28 NOTE — Telephone Encounter (Signed)
Tried to call pt but was unable to leave voicemail. Okay for nurse triage to  disclose

## 2018-11-15 ENCOUNTER — Other Ambulatory Visit: Payer: Self-pay | Admitting: Internal Medicine

## 2018-12-25 ENCOUNTER — Other Ambulatory Visit: Payer: Self-pay | Admitting: Internal Medicine

## 2018-12-29 ENCOUNTER — Other Ambulatory Visit: Payer: Self-pay | Admitting: Internal Medicine

## 2018-12-29 NOTE — Telephone Encounter (Signed)
Please advise. Ok to send for 90 days?  Last filled 10/18/2018

## 2018-12-29 NOTE — Telephone Encounter (Signed)
It is unclear what she is actually taking  ( I think meds were changed for  Decreased availability)   Please update med list and then can send in enough to get through dec[facility]

## 2018-12-29 NOTE — Telephone Encounter (Signed)
Left message for patient to call back. CRM created 

## 2019-01-20 ENCOUNTER — Other Ambulatory Visit: Payer: Self-pay | Admitting: Internal Medicine

## 2019-05-10 ENCOUNTER — Other Ambulatory Visit: Payer: Self-pay

## 2019-05-10 ENCOUNTER — Emergency Department (HOSPITAL_COMMUNITY)
Admission: EM | Admit: 2019-05-10 | Discharge: 2019-05-10 | Disposition: A | Discharge status: Home / Self Care | Payer: 59 | Attending: Emergency Medicine | Admitting: Emergency Medicine

## 2019-05-10 ENCOUNTER — Encounter (HOSPITAL_COMMUNITY): Payer: Self-pay | Admitting: Emergency Medicine

## 2019-05-10 DIAGNOSIS — Z87891 Personal history of nicotine dependence: Secondary | ICD-10-CM | POA: Insufficient documentation

## 2019-05-10 DIAGNOSIS — I10 Essential (primary) hypertension: Secondary | ICD-10-CM | POA: Diagnosis not present

## 2019-05-10 DIAGNOSIS — I471 Supraventricular tachycardia: Secondary | ICD-10-CM | POA: Diagnosis not present

## 2019-05-10 DIAGNOSIS — R002 Palpitations: Secondary | ICD-10-CM | POA: Diagnosis present

## 2019-05-10 DIAGNOSIS — Z79899 Other long term (current) drug therapy: Secondary | ICD-10-CM | POA: Insufficient documentation

## 2019-05-10 DIAGNOSIS — J45909 Unspecified asthma, uncomplicated: Secondary | ICD-10-CM | POA: Insufficient documentation

## 2019-05-10 DIAGNOSIS — R0600 Dyspnea, unspecified: Secondary | ICD-10-CM | POA: Insufficient documentation

## 2019-05-10 LAB — I-STAT CHEM 8, ED
BUN: 15 mg/dL (ref 6–20)
Calcium, Ion: 1.12 mmol/L — ABNORMAL LOW (ref 1.15–1.40)
Chloride: 101 mmol/L (ref 98–111)
Creatinine, Ser: 0.7 mg/dL (ref 0.44–1.00)
Glucose, Bld: 135 mg/dL — ABNORMAL HIGH (ref 70–99)
HCT: 45 % (ref 36.0–46.0)
Hemoglobin: 15.3 g/dL — ABNORMAL HIGH (ref 12.0–15.0)
Potassium: 3.9 mmol/L (ref 3.5–5.1)
Sodium: 141 mmol/L (ref 135–145)
TCO2: 28 mmol/L (ref 22–32)

## 2019-05-10 LAB — CBG MONITORING, ED: Glucose-Capillary: 111 mg/dL — ABNORMAL HIGH (ref 70–99)

## 2019-05-10 NOTE — Discharge Instructions (Addendum)
Follow up with your cardiologist if you have recurrent episodes of your SVT, return to the ED as needed for worsening/recurrent symptoms

## 2019-05-10 NOTE — ED Triage Notes (Signed)
Pt here for SVT with a HR in the 170s. Pt states she has a history of this before. Pt states she feels SOB also

## 2019-05-10 NOTE — ED Provider Notes (Signed)
[facility] EMERGENCY DEPARTMENT Provider Note   CSN: [id number] Arrival date & time: 05/10/19  1056     History   Chief Complaint Chief Complaint  Patient presents with  . SVT    HPI Danielle Martin is a 58 y.o. female.     HPI Patient presents to the ED for evaluation of palpitations and dyspnea that started this morning.  Patient has a history of SVT.  She has infrequent attacks.  Last time she had a significant episode was several years ago.  Patient states she did require medications in the IV to convert.  Patient has seen a cardiologist who instructed no further treatment was necessary unless she started having more frequent episodes.  Patient tried coughing and sneezing which is helped her resolved previous episodes of SVT at home prior to coming into the ED this morning but they were unsuccessful.  Patient does feel fatigued and lightheaded.  She denies any fevers or chills.  No chest pain.  No abdominal pain.  No vomiting or diarrhea. Past Medical History:  Diagnosis Date  . Allergic rhinitis    Worse in winter , grasses  . Asthma    Worse in winter  . Gall stone pancreatitis    lap choley 1 2017  . GERD (gastroesophageal reflux disease)   . [id number])    "bi-weekly" (09/03/2015)  . HEMORRHAGIC CYSTITIS 05/25/2009   Qualifier: Diagnosis of  By: Regis Bill MD, Standley Brooking   . Hypertension   . Iron deficiency anemia 05/01/2015  . Migraine    "q couple months" (09/03/2015)  . Pneumonia    "a few times" (09/03/2015)  . SVT (supraventricular tachycardia) (HCC)    No longer a problem    Patient Active Problem List   Diagnosis Date Noted  . Cervical radiculopathy 10/25/2016  . HLD (hyperlipidemia) 04/08/2016  . Hypertension 11/25/2013  . Neoplasm of uncertain behavior of skin 07/20/2012  . Visit for preventive health examination 07/20/2012  . SVT (supraventricular tachycardia) ([facility])   . Allergic rhinitis 05/13/2008  . OBESITY, UNSPECIFIED 05/08/2008   . Thrombocytosis ([place]) 05/08/2008  . ASTHMA 05/08/2008  . OTHER SLEEP DISTURBANCES 05/08/2008    Past Surgical History:  Procedure Laterality Date  . ANTERIOR CERVICAL DECOMP/DISCECTOMY FUSION  2008  . BACK SURGERY    . CESAREAN SECTION  1990; 1991  . CHOLECYSTECTOMY N/A 09/04/2015   Procedure: LAPAROSCOPIC CHOLECYSTECTOMY WITH INTRAOPERATIVE CHOLANGIOGRAM;  Surgeon: Greer Pickerel, MD;  Location: [facility];  Service: General;  Laterality: N/A;  . OOPHORECTOMY  1980s   "all of the right; part of the left"  . VAGINAL HYSTERECTOMY  2010     OB History   No obstetric history on file.      Home Medications    Prior to Admission medications   Medication Sig Start Date End Date Taking? Authorizing Provider  albuterol (PROVENTIL HFA;VENTOLIN HFA) 108 (90 BASE) MCG/ACT inhaler Inhale 2 puffs into the lungs every 6 (six) hours as needed for wheezing. 10/26/13   Panosh, Standley Brooking, MD  albuterol (PROVENTIL) (2.5 MG/3ML) 0.083% nebulizer solution Take 3 mLs (2.5 mg total) by nebulization every 6 (six) hours as needed for wheezing or shortness of breath. 11/25/13   Shawna Orleans, Doe-Hyun R, DO  Calcium Carbonate-Vitamin D 600-400 MG-UNIT per chew tablet Chew 1 tablet by mouth daily.    [provider]  CINNAMON PO Take 1,000 Units by mouth every morning.    [provider]  escitalopram (LEXAPRO) 10 MG tablet TAKE 1  TABLET BY MOUTH EVERY DAY 12/27/18   Panosh, Standley Brooking, MD  hydrochlorothiazide (HYDRODIURIL) 12.5 MG tablet TAKE 1 TABLET BY MOUTH EVERY DAY 10/18/18   Panosh, Standley Brooking, MD  LORazepam (ATIVAN) 0.5 MG tablet 1 po if needed,  Up to  Every  8 hours  For anxiety 10/19/17   Panosh, Standley Brooking, MD  losartan (COZAAR) 50 MG tablet Take 1 tablet (50 mg total) by mouth daily. 05/17/18   Laurey Morale, MD  losartan-hydrochlorothiazide (HYZAAR) 50-12.5 MG tablet Take 1 tablet by mouth daily. 10/19/18   Panosh, Standley Brooking, MD  metoprolol succinate (TOPROL-XL) 50 MG 24 hr tablet TAKE 1 TABLET DAILY. TAKE  WITH  OR IMMEDIATELY        FOLLOWING A MEAL. (DUE     FOR PHYSICAL) 01/20/19   Panosh, Standley Brooking, MD  Multiple Vitamin (MULTI-VITAMIN PO) Take 1 tablet by mouth daily.     [provider]  vitamin E 400 UNIT capsule Take 400 Units by mouth daily.    [provider]    Family History Family History  Problem Relation Age of Onset  . Other Mother        Died of asphyxiation  . Prostate cancer Father     Social History Social History   Tobacco Use  . Smoking status: Never Smoker  . Smokeless tobacco: Never Used  . Tobacco comment: "stopped smoking in the 1980s"  Substance Use Topics  . Alcohol use: Yes    Alcohol/week: 8.0 standard drinks    Types: 4 Glasses of wine, 4 Shots of liquor per week  . Drug use: No     Allergies   Codeine and Ace inhibitors   Review of Systems Review of Systems  All other systems reviewed and are negative.    Physical Exam Updated Vital Signs BP 124/80   Pulse 82   Temp 98.7 F (37.1 C) (Oral)   Resp 12   Ht 1.549 m (5\' 1" )   Wt 117.9 kg   SpO2 93%   BMI 49.13 kg/m   Physical Exam Vitals signs and nursing note reviewed.  Constitutional:      General: She is not in acute distress.    Appearance: She is well-developed.  HENT:     Head: Normocephalic and atraumatic.     Right Ear: External ear normal.     Left Ear: External ear normal.  Eyes:     General: No scleral icterus.       Right eye: No discharge.        Left eye: No discharge.     Conjunctiva/sclera: Conjunctivae normal.  Neck:     Musculoskeletal: Neck supple.     Trachea: No tracheal deviation.  Cardiovascular:     Rate and Rhythm: Regular rhythm. Bradycardia present.  Pulmonary:     Effort: Pulmonary effort is normal. No respiratory distress.     Breath sounds: Normal breath sounds. No stridor. No wheezing or rales.  Abdominal:     General: Bowel sounds are normal. There is no distension.     Palpations: Abdomen is soft.     Tenderness: There is no  abdominal tenderness. There is no guarding or rebound.  Musculoskeletal:        General: No tenderness.  Skin:    General: Skin is warm and dry.     Findings: No rash.  Neurological:     Mental Status: She is alert.     Cranial Nerves: No cranial nerve  deficit (no facial droop, extraocular movements intact, no slurred speech).     Sensory: No sensory deficit.     Motor: No abnormal muscle tone or seizure activity.     Coordination: Coordination normal.      ED Treatments / Results  Labs (all labs ordered are listed, but only abnormal results are displayed) Labs Reviewed  I-STAT CHEM 8, ED - Abnormal; Notable for the following components:      Result Value   Glucose, Bld 135 (*)    Calcium, Ion 1.12 (*)    Hemoglobin 15.3 (*)    All other components within normal limits    EKG EKG Interpretation  Date/Time:  Tuesday May 10 2019 11:06:18 EDT Ventricular Rate:  178 PR Interval:    QRS Duration: 96 QT Interval:  276 QTC Calculation: 475 R Axis:   2 Text Interpretation:  Supraventricular tachycardia Septal infarct , age undetermined Abnormal ECG Since last tracing rate faster Confirmed by Dorie Rank [phone]) on 05/10/2019 11:16:21 AM    EKG Interpretation  Date/Time:  Tuesday May 10 2019 11:18:58 EDT Ventricular Rate:  92 PR Interval:    QRS Duration: 98 QT Interval:  332 QTC Calculation: 411 R Axis:   2 Text Interpretation:  Sinus rhythm Left atrial enlargement Abnormal R-wave progression, late transition svt resolved since last tracing Confirmed by Dorie Rank [phone]) on 05/10/2019 11:22:53 AM       Radiology No results found.  Procedures Procedures (including critical care time)  Medications Ordered in ED Medications - No data to display   Initial Impression / Assessment and Plan / ED Course  I have reviewed the triage vital signs and the nursing notes.  Pertinent labs & imaging results that were available during my care of the patient were  reviewed by me and considered in my medical decision making (see chart for details).  Clinical Course as of May 10 1223  Tue May 10, 2019  1125 While I was at the bedside obtaining history from the patient, and as we were discussing the use of adenosine patient spontaneously converted.  Repeat EKG confirms a sinus rhythm and she is remaining in a sinus rhythm on the monitor.   [JK]  1210 No significant abnormalities noted on the i-STAT   [JK]    Clinical Course User Index [JK] Dorie Rank, MD     Pt presented to the ED for evaluation of recurrent supraventricular tachycardia.  Patient was unable to get it to resolve at home with any of her typical maneuvers.  While she was in the ED the patient spontaneously converted back to sinus rhythm.  She was observed for period of time following that and had no recurrent episodes.  No signs of any electrolyte abnormalities.  Patient is feeling well and stable for discharge.  We discussed outpatient follow-up with her cardiologist as she was previously instructed if she started to have more frequent episodes.  Final Clinical Impressions(s) / ED Diagnoses   Final diagnoses:  Paroxysmal SVT (supraventricular tachycardia) [facility])    ED Discharge Orders    None       Dorie Rank, MD 05/10/19 1224

## 2019-05-10 NOTE — ED Notes (Signed)
Patient verbalizes understanding of discharge instructions. Opportunity for questioning and answers were provided. Armband removed by staff, pt discharged from ED ambulatory to home.  

## 2019-06-13 NOTE — Progress Notes (Signed)
Chief Complaint  Patient presents with  . Annual Exam    HPI: Patient  Danielle Martin  58 y.o. comes in today for Preventive Health Care visit    Seen ed pr Paroxysmal svt  9 22 20  conversted spontaneaoulsy   Still on the metoprolol   Snores   No osa noted.   Bp seems to be ok at home but ran out of med a few months ago  ? 130 range  Losartan hctz and indiv doses   Working at home and gained weight since covid shut down   lexapro seems helpful?   Health Maintenance  Topic Date Due  . HIV Screening  02/22/1978  . MAMMOGRAM  07/28/2020  . TETANUS/TDAP  12/24/2020  . COLONOSCOPY  02/19/2021  . INFLUENZA VACCINE  Completed  . Hepatitis C Screening  Completed   Health Maintenance Review LIFESTYLE:  Exercise:  No  Tobacco/ETS: no Alcohol:  ocass Sugar beverages: no  Sleep: 6 hours  Drug use: no HH of  2  Work: 50 per week at home   ROS:  GEN/ HEENT: No fever, sweats headaches vision problems hearing changes, CV/ PULM; No chest pain shortness of breath cough, syncope,edema  change in exercise tolerance. GI /GU: No adominal pain, vomiting, change in bowel habits. No blood in the stool. No significant GU symptoms. SKIN/HEME: ,no acute skin rashes suspicious lesions or bleeding. No lymphadenopathy, nodules, masses.  NEURO/ PSYCH:  No neurologic signs such as weakness numbness. No depression anxiety. IMM/ Allergy: No unusual infections.  Allergy .   REST of 12 system review negative except as per HPI   Past Medical History:  Diagnosis Date  . Allergic rhinitis    Worse in winter , grasses  . Asthma    Worse in winter  . Gall stone pancreatitis    lap choley 1 2017  . GERD (gastroesophageal reflux disease)   . [id number])    "bi-weekly" (09/03/2015)  . HEMORRHAGIC CYSTITIS 05/25/2009   Qualifier: Diagnosis of  By: Regis Bill MD, Standley Brooking   . Hypertension   . Iron deficiency anemia 05/01/2015  . Migraine    "q couple months" (09/03/2015)  . Pneumonia    "a few  times" (09/03/2015)  . SVT (supraventricular tachycardia) (HCC)    No longer a problem    Past Surgical History:  Procedure Laterality Date  . ANTERIOR CERVICAL DECOMP/DISCECTOMY FUSION  2008  . BACK SURGERY    . CESAREAN SECTION  1990; 1991  . CHOLECYSTECTOMY N/A 09/04/2015   Procedure: LAPAROSCOPIC CHOLECYSTECTOMY WITH INTRAOPERATIVE CHOLANGIOGRAM;  Surgeon: Greer Pickerel, MD;  Location: [facility];  Service: General;  Laterality: N/A;  . OOPHORECTOMY  1980s   "all of the right; part of the left"  . VAGINAL HYSTERECTOMY  2010    Family History  Problem Relation Age of Onset  . Other Mother        Died of asphyxiation  . Prostate cancer Father     Social History   Socioeconomic History  . Marital status: Married    Spouse name: Not on file  . Number of children: Not on file  . Years of education: Not on file  . Highest education level: Not on file  Occupational History  . Not on file  Social Needs  . Financial resource strain: Not on file  . Food insecurity    Worry: Not on file    Inability: Not on file  . Transportation needs    Medical: Not on  file    Non-medical: Not on file  Tobacco Use  . Smoking status: Never Smoker  . Smokeless tobacco: Never Used  . Tobacco comment: "stopped smoking in the 1980s"  Substance and Sexual Activity  . Alcohol use: Yes    Alcohol/week: 8.0 standard drinks    Types: 4 Glasses of wine, 4 Shots of liquor per week  . Drug use: No  . Sexual activity: Yes  Lifestyle  . Physical activity    Days per week: Not on file    Minutes per session: Not on file  . Stress: Not on file  Relationships  . Social Herbalist on phone: Not on file    Gets together: Not on file    Attends religious service: Not on file    Active member of club or organization: Not on file    Attends meetings of clubs or organizations: Not on file    Relationship status: Not on file  Other Topics Concern  . 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 Calcium Carbonate-Vitamin D 600-400 MG-UNIT per chew tablet Chew 1 tablet by mouth daily.    Marland Kitchen CINNAMON PO Take 1,000 Units by mouth every morning.    . escitalopram (LEXAPRO) 10 MG tablet TAKE 1 TABLET BY MOUTH EVERY DAY 90 tablet 1  . LORazepam (ATIVAN) 0.5 MG tablet 1 po if needed,  Up to  Every  8 hours  For anxiety 20 tablet 0  . metoprolol succinate (TOPROL-XL) 50 MG 24 hr tablet TAKE 1 TABLET DAILY. TAKE  WITH OR IMMEDIATELY        FOLLOWING A MEAL. (DUE     FOR PHYSICAL) 30 tablet 5  . Multiple Vitamin (MULTI-VITAMIN PO) Take 1 tablet by mouth daily.     . vitamin E 400 UNIT capsule Take 400 Units by mouth daily.    Marland Kitchen losartan-hydrochlorothiazide (HYZAAR) 50-12.5 MG tablet Take 1 tablet by mouth daily. 30 tablet 0  . albuterol (PROVENTIL HFA;VENTOLIN HFA) 108 (90 BASE) MCG/ACT inhaler Inhale 2 puffs into the lungs every 6 (six) hours as needed for wheezing.    Marland Kitchen albuterol (PROVENTIL) (2.5 MG/3ML) 0.083% nebulizer solution Take 3 mLs (2.5 mg total) by nebulization every 6 (six) hours as needed for wheezing or shortness of breath. (Patient not taking: Reported on 06/14/2019) 150 mL 1  . hydrochlorothiazide (HYDRODIURIL) 12.5 MG tablet TAKE 1 TABLET BY MOUTH EVERY DAY 30 tablet 1  . losartan (COZAAR) 50 MG tablet Take 1 tablet (50 mg total) by mouth daily. 30 tablet 1   No facility-administered medications prior to visit.      EXAM:  BP (!) 160/94 Comment: OUT OF HCTZ  Temp (!) 97.4 F (36.3 C) (Temporal)   Ht 5\' 5"  (1.651 m)   Wt 270 lb (122.5 kg)   BMI 44.93 kg/m   Body mass index is 44.93 kg/m. Wt Readings from Last 3 Encounters:  06/14/19 270 lb (122.5 kg)  05/10/19 260 lb (117.9 kg)   07/23/18 260 lb 6.4 oz (118.1 kg)    Physical Exam:  Vital signs reviewed [id number] is a well-developed well-nourished alert cooperative    who appearsr stated age in no acute distress.  HEENT: normocephalic atraumatic , Eyes: PERRL EOM's full, conjunctiva clear,., Ears: no deformity EAC's clear TMs with normal landmarks. Mouth:masked NECK: supple without masses, thyromegaly or bruits. CHEST/PULM:  Clear to auscultation and percussion breath sounds equal no wheeze , rales or rhonchi. No chest wall deformities or tenderness. Breast: normal by inspection . No dimpling, discharge, masses, tenderness or discharge . CV: PMI is nondisplaced, S1 S2 no gallops, murmurs, rubs. Peripheral pulses are full without delay.No JVD .  ABDOMEN: Bowel sounds normal nontender  No guard or rebound, no hepato splenomegal no CVA tenderness.   Extremtities:  No clubbing cyanosis or edema, no acute joint swelling or redness no focal atrophy NEURO:  Oriented x3, cranial nerves 3-12 appear to be intact, no obvious focal weakness,gait within normal limits no abnormal reflexes or asymmetrical SKIN: No acute rashes normal turgor, color, no bruising or petechiae. PSYCH: Oriented, good eye contact, no obvious depression anxiety, cognition and judgment appear normal. LN: no cervical axillary inguinal adenopathy  Lab Results  Component Value Date   WBC 7.0 07/23/2018   HGB 15.3 (H) 05/10/2019   HCT 45.0 05/10/2019   PLT 452.0 (H) 07/23/2018   GLUCOSE 135 (H) 05/10/2019   CHOL 180 07/23/2018   TRIG 209.0 (H) 07/23/2018   HDL 44.00 07/23/2018   LDLDIRECT 115.0 07/23/2018   LDLCALC 95 11/13/2014   ALT 18 07/23/2018   AST 18 07/23/2018   NA 141 05/10/2019   K 3.9 05/10/2019   CL 101 05/10/2019   CREATININE 0.70 05/10/2019   BUN 15 05/10/2019   CO2 32 07/23/2018   TSH 1.41 07/23/2018   INR 1.0 01/24/2009   HGBA1C 5.8 07/23/2018    BP Readings from Last 3 Encounters:  06/14/19 (!) 160/94  05/10/19 124/80   07/23/18 118/82    Lab plan reviewed with patient   ASSESSMENT AND PLAN:  Discussed the following assessment and plan:    ICD-10-CM   1. Visit for preventive health examination  123456 Basic metabolic panel    CBC with Differential/Platelet    Hemoglobin A1c    Hepatic function panel    Lipid panel    TSH  2. Essential hypertension  99991111 Basic metabolic panel    CBC with Differential/Platelet    Hemoglobin A1c    Hepatic function panel    Lipid panel    TSH  3. Medication management  123456 Basic metabolic panel    CBC with Differential/Platelet    Hemoglobin A1c    Hepatic function panel    Lipid panel    TSH  4. Thrombocytosis (HCC)  99991111 Basic metabolic panel    CBC with Differential/Platelet    Hemoglobin A1c    Hepatic function panel    Lipid panel    TSH  5. Hyperlipidemia, unspecified hyperlipidemia type  99991111 Basic metabolic panel    CBC with Differential/Platelet    Hemoglobin A1c    Hepatic function panel    Lipid panel    TSH  6. Snoring  R06.83   7. Morbid obesity ([place])  E66.01    Says bp is uncer control but  Get back on lis hctz for best control   Counseled. About weight control stratetifes   Getting outside  de stress  And sleep   If svt recurrent then back to cards but at this time can observe Return for depending on labs  and BP  6-12 months .   Patient Care Team: Panosh, Standley Brooking, MD as PCP - General Arvella Nigh, MD (Obstetrics and Gynecology) Marybelle Killings, MD as Attending Physician (Orthopedic Surgery) Patient Instructions   Work on more sleep . If snoring and un refreshed by sleep or  Stop breathing then contact us for referral for sleep evaluation.  Get away from work space for  Eating.  Get outside  Simple walks are helpful.   Refilling bp med  Will notify you  of labs when available.   And then plan fu  bp goal is  130/80 but 120/80 better  If SVT recurring we can get  Cardiology involved again.     Health Maintenance, Female  Adopting a healthy lifestyle and getting preventive care are important in promoting health and wellness. Ask your health care provider about:  The right schedule for you to have regular tests and exams.  Things you can do on your own to prevent diseases and keep yourself healthy. What should I know about diet, weight, and exercise? Eat a healthy diet   Eat a diet that includes plenty of vegetables, fruits, low-fat dairy products, and lean protein.  Do not eat a lot of foods that are high in solid fats, added sugars, or sodium. Maintain a healthy weight Body mass index (BMI) is used to identify weight problems. It estimates body fat based on height and weight. Your health care provider can help determine your BMI and help you achieve or maintain a healthy weight. Get regular exercise Get regular exercise. This is one of the most important things you can do for your health. Most adults should:  Exercise for at least 150 minutes each week. The exercise should increase your heart rate and make you sweat (moderate-intensity exercise).  Do strengthening exercises at least twice a week. This is in addition to the moderate-intensity exercise.  Spend less time sitting. Even light physical activity can be beneficial. Watch cholesterol and blood lipids Have your blood tested for lipids and cholesterol at 55 years of age, then have this test every 5 years. Have your cholesterol levels checked more often if:  Your lipid or cholesterol levels are high.  You are older than 56 years of age.  You are at high risk for heart disease. What should I know about cancer screening? Depending on your health history and family history, you may need to have cancer screening at various ages. This may include screening for:  Breast cancer.  Cervical cancer.  Colorectal cancer.  Skin cancer.  Lung cancer. What should I know about heart disease, diabetes, and high blood pressure? Blood pressure and  heart disease  High blood pressure causes heart disease and increases the risk of stroke. This is more likely to develop in people who have high blood pressure readings, are of African descent, or are overweight.  Have your blood pressure checked: ? Every 3-5 years if you are 49-54 years of age. ? Every year if you are 23 years old or older. Diabetes Have regular diabetes screenings. This checks your fasting blood sugar level. Have the screening done:  Once every three years after age 67 if you are at a normal weight and have a low risk for diabetes.  More often and at a younger age if you are overweight or have a high risk for diabetes. What should I know about preventing infection? Hepatitis B If you have a higher risk for hepatitis B, you should be  screened for this virus. Talk with your health care provider to find out if you are at risk for hepatitis B infection. Hepatitis C Testing is recommended for:  Everyone born from [birth year] through 1963.  Anyone with known risk factors for hepatitis C. Sexually transmitted infections (STIs)  Get screened for STIs, including gonorrhea and chlamydia, if: ? You are sexually active and are younger than 55 years of age. ? You are older than 56 years of age and your health care provider tells you that you are at risk for this type of infection. ? Your sexual activity has changed since you were last screened, and you are at increased risk for chlamydia or gonorrhea. Ask your health care provider if you are at risk.  Ask your health care provider about whether you are at high risk for HIV. Your health care provider may recommend a prescription medicine to help prevent HIV infection. If you choose to take medicine to prevent HIV, you should first get tested for HIV. You should then be tested every 3 months for as long as you are taking the medicine. Pregnancy  If you are about to stop having your period (premenopausal) and you may become pregnant, seek  counseling before you get pregnant.  Take 400 to 800 micrograms (mcg) of folic acid every day if you become pregnant.  Ask for birth control (contraception) if you want to prevent pregnancy. Osteoporosis and menopause Osteoporosis is a disease in which the bones lose minerals and strength with aging. This can result in bone fractures. If you are 8 years old or older, or if you are at risk for osteoporosis and fractures, ask your health care provider if you should:  Be screened for bone loss.  Take a calcium or vitamin D supplement to lower your risk of fractures.  Be given hormone replacement therapy (HRT) to treat symptoms of menopause. Follow these instructions at home: Lifestyle  Do not use any products that contain nicotine or tobacco, such as cigarettes, e-cigarettes, and chewing tobacco. If you need help quitting, ask your health care provider.  Do not use street drugs.  Do not share needles.  Ask your health care provider for help if you need support or information about quitting drugs. Alcohol use  Do not drink alcohol if: ? Your health care provider tells you not to drink. ? You are pregnant, may be pregnant, or are planning to become pregnant.  If you drink alcohol: ? Limit how much you use to 0-1 drink a day. ? Limit intake if you are breastfeeding.  Be aware of how much alcohol is in your drink. In the [place] one drink equals one 12 oz bottle of beer (355 mL), one 5 oz glass of wine (148 mL), or one 1 oz glass of hard liquor (44 mL). General instructions  Schedule regular health, dental, and eye exams.  Stay current with your vaccines.  Tell your health care provider if: ? You often feel depressed. ? You have ever been abused or do not feel safe at home. Summary  Adopting a healthy lifestyle and getting preventive care are important in promoting health and wellness.  Follow your health care provider's instructions about healthy diet, exercising, and getting  tested or screened for diseases.  Follow your health care provider's instructions on monitoring your cholesterol and blood pressure. This information is not intended to replace advice given to you by your health care provider. Make sure you discuss any questions you have with your health  care provider. Document Released: 02/17/2011 Document Revised: 07/28/2018 Document Reviewed: 07/28/2018 Elsevier Patient Education  2020 [place] Panosh M.D.

## 2019-06-13 NOTE — Patient Instructions (Addendum)
Work on more sleep . If snoring and un refreshed by sleep or  Stop breathing then contact us for referral for sleep evaluation.  Get away from work space for  Eating.  Get outside  Simple walks are helpful.   Refilling bp med  Will notify you  of labs when available.   And then plan fu  bp goal is  130/80 but 120/80 better  If SVT recurring we can get  Cardiology involved again.     Health Maintenance, Female Adopting a healthy lifestyle and getting preventive care are important in promoting health and wellness. Ask your health care provider about:  The right schedule for you to have regular tests and exams.  Things you can do on your own to prevent diseases and keep yourself healthy. What should I know about diet, weight, and exercise? Eat a healthy diet   Eat a diet that includes plenty of vegetables, fruits, low-fat dairy products, and lean protein.  Do not eat a lot of foods that are high in solid fats, added sugars, or sodium. Maintain a healthy weight Body mass index (BMI) is used to identify weight problems. It estimates body fat based on height and weight. Your health care provider can help determine your BMI and help you achieve or maintain a healthy weight. Get regular exercise Get regular exercise. This is one of the most important things you can do for your health. Most adults should:  Exercise for at least 150 minutes each week. The exercise should increase your heart rate and make you sweat (moderate-intensity exercise).  Do strengthening exercises at least twice a week. This is in addition to the moderate-intensity exercise.  Spend less time sitting. Even light physical activity can be beneficial. Watch cholesterol and blood lipids Have your blood tested for lipids and cholesterol at 56 years of age, then have this test every 5 years. Have your cholesterol levels checked more often if:  Your lipid or cholesterol levels are high.  You are older than 56 years of  age.  You are at high risk for heart disease. What should I know about cancer screening? Depending on your health history and family history, you may need to have cancer screening at various ages. This may include screening for:  Breast cancer.  Cervical cancer.  Colorectal cancer.  Skin cancer.  Lung cancer. What should I know about heart disease, diabetes, and high blood pressure? Blood pressure and heart disease  High blood pressure causes heart disease and increases the risk of stroke. This is more likely to develop in people who have high blood pressure readings, are of African descent, or are overweight.  Have your blood pressure checked: ? Every 3-5 years if you are 51-44 years of age. ? Every year if you are 82 years old or older. Diabetes Have regular diabetes screenings. This checks your fasting blood sugar level. Have the screening done:  Once every three years after age 67 if you are at a normal weight and have a low risk for diabetes.  More often and at a younger age if you are overweight or have a high risk for diabetes. What should I know about preventing infection? Hepatitis B If you have a higher risk for hepatitis B, you should be screened for this virus. Talk with your health care provider to find out if you are at risk for hepatitis B infection. Hepatitis C Testing is recommended for:  Everyone born from [birth year] through 1965.  Anyone with known  risk factors for hepatitis C. Sexually transmitted infections (STIs)  Get screened for STIs, including gonorrhea and chlamydia, if: ? You are sexually active and are younger than 56 years of age. ? You are older than 56 years of age and your health care provider tells you that you are at risk for this type of infection. ? Your sexual activity has changed since you were last screened, and you are at increased risk for chlamydia or gonorrhea. Ask your health care provider if you are at risk.  Ask your health care  provider about whether you are at high risk for HIV. Your health care provider may recommend a prescription medicine to help prevent HIV infection. If you choose to take medicine to prevent HIV, you should first get tested for HIV. You should then be tested every 3 months for as long as you are taking the medicine. Pregnancy  If you are about to stop having your period (premenopausal) and you may become pregnant, seek counseling before you get pregnant.  Take 400 to 800 micrograms (mcg) of folic acid every day if you become pregnant.  Ask for birth control (contraception) if you want to prevent pregnancy. Osteoporosis and menopause Osteoporosis is a disease in which the bones lose minerals and strength with aging. This can result in bone fractures. If you are 48 years old or older, or if you are at risk for osteoporosis and fractures, ask your health care provider if you should:  Be screened for bone loss.  Take a calcium or vitamin D supplement to lower your risk of fractures.  Be given hormone replacement therapy (HRT) to treat symptoms of menopause. Follow these instructions at home: Lifestyle  Do not use any products that contain nicotine or tobacco, such as cigarettes, e-cigarettes, and chewing tobacco. If you need help quitting, ask your health care provider.  Do not use street drugs.  Do not share needles.  Ask your health care provider for help if you need support or information about quitting drugs. Alcohol use  Do not drink alcohol if: ? Your health care provider tells you not to drink. ? You are pregnant, may be pregnant, or are planning to become pregnant.  If you drink alcohol: ? Limit how much you use to 0-1 drink a day. ? Limit intake if you are breastfeeding.  Be aware of how much alcohol is in your drink. In the [place] one drink equals one 12 oz bottle of beer (355 mL), one 5 oz glass of wine (148 mL), or one 1 oz glass of hard liquor (44 mL). General  instructions  Schedule regular health, dental, and eye exams.  Stay current with your vaccines.  Tell your health care provider if: ? You often feel depressed. ? You have ever been abused or do not feel safe at home. Summary  Adopting a healthy lifestyle and getting preventive care are important in promoting health and wellness.  Follow your health care provider's instructions about healthy diet, exercising, and getting tested or screened for diseases.  Follow your health care provider's instructions on monitoring your cholesterol and blood pressure. This information is not intended to replace advice given to you by your health care provider. Make sure you discuss any questions you have with your health care provider. Document Released: 02/17/2011 Document Revised: 07/28/2018 Document Reviewed: 07/28/2018 Elsevier Patient Education  2020 [employer].

## 2019-06-14 ENCOUNTER — Other Ambulatory Visit: Payer: Self-pay

## 2019-06-14 ENCOUNTER — Ambulatory Visit (INDEPENDENT_AMBULATORY_CARE_PROVIDER_SITE_OTHER): Payer: 59 | Admitting: Internal Medicine

## 2019-06-14 ENCOUNTER — Encounter: Payer: Self-pay | Admitting: Internal Medicine

## 2019-06-14 VITALS — BP 160/94 | Temp 97.4°F | Ht 65.0 in | Wt 270.0 lb

## 2019-06-14 DIAGNOSIS — R0683 Snoring: Secondary | ICD-10-CM

## 2019-06-14 DIAGNOSIS — D473 Essential (hemorrhagic) thrombocythemia: Secondary | ICD-10-CM

## 2019-06-14 DIAGNOSIS — Z79899 Other long term (current) drug therapy: Secondary | ICD-10-CM

## 2019-06-14 DIAGNOSIS — I10 Essential (primary) hypertension: Secondary | ICD-10-CM

## 2019-06-14 DIAGNOSIS — Z0000 Encounter for general adult medical examination without abnormal findings: Secondary | ICD-10-CM

## 2019-06-14 DIAGNOSIS — D75839 Thrombocytosis, unspecified: Secondary | ICD-10-CM

## 2019-06-14 DIAGNOSIS — E785 Hyperlipidemia, unspecified: Secondary | ICD-10-CM

## 2019-06-14 LAB — CBC WITH DIFFERENTIAL/PLATELET
Basophils Absolute: 0.1 10*3/uL (ref 0.0–0.1)
Basophils Relative: 1.2 % (ref 0.0–3.0)
Eosinophils Absolute: 0.3 10*3/uL (ref 0.0–0.7)
Eosinophils Relative: 3.7 % (ref 0.0–5.0)
HCT: 41.5 % (ref 36.0–46.0)
Hemoglobin: 13.6 g/dL (ref 12.0–15.0)
Lymphocytes Relative: 20.5 % (ref 12.0–46.0)
Lymphs Abs: 1.6 10*3/uL (ref 0.7–4.0)
MCHC: 32.7 g/dL (ref 30.0–36.0)
MCV: 84.6 fl (ref 78.0–100.0)
Monocytes Absolute: 0.4 10*3/uL (ref 0.1–1.0)
Monocytes Relative: 5.3 % (ref 3.0–12.0)
Neutro Abs: 5.3 10*3/uL (ref 1.4–7.7)
Neutrophils Relative %: 69.3 % (ref 43.0–77.0)
Platelets: 411 10*3/uL — ABNORMAL HIGH (ref 150.0–400.0)
RBC: 4.9 Mil/uL (ref 3.87–5.11)
RDW: 14.5 % (ref 11.5–15.5)
WBC: 7.6 10*3/uL (ref 4.0–10.5)

## 2019-06-14 LAB — HEPATIC FUNCTION PANEL
ALT: 15 U/L (ref 0–35)
AST: 17 U/L (ref 0–37)
Albumin: 4.2 g/dL (ref 3.5–5.2)
Alkaline Phosphatase: 58 U/L (ref 39–117)
Bilirubin, Direct: 0.1 mg/dL (ref 0.0–0.3)
Total Bilirubin: 0.6 mg/dL (ref 0.2–1.2)
Total Protein: 7 g/dL (ref 6.0–8.3)

## 2019-06-14 LAB — BASIC METABOLIC PANEL
BUN: 20 mg/dL (ref 6–23)
CO2: 33 mEq/L — ABNORMAL HIGH (ref 19–32)
Calcium: 8.8 mg/dL (ref 8.4–10.5)
Chloride: 102 mEq/L (ref 96–112)
Creatinine, Ser: 0.61 mg/dL (ref 0.40–1.20)
GFR: 101.35 mL/min (ref >60.00)
Glucose, Bld: 102 mg/dL — ABNORMAL HIGH (ref 70–99)
Potassium: 4.8 mEq/L (ref 3.5–5.1)
Sodium: 141 mEq/L (ref 135–145)

## 2019-06-14 LAB — LIPID PANEL
Cholesterol: 202 mg/dL — ABNORMAL HIGH (ref 0–200)
HDL: 46.7 mg/dL (ref >39.00)
LDL Cholesterol: 122 mg/dL — ABNORMAL HIGH (ref 0–99)
NonHDL: 155.27
Total CHOL/HDL Ratio: 4
Triglycerides: 164 mg/dL — ABNORMAL HIGH (ref 0.0–149.0)
VLDL: 32.8 mg/dL (ref 0.0–40.0)

## 2019-06-14 LAB — HEMOGLOBIN A1C: Hgb A1c MFr Bld: 5.8 % (ref 4.6–6.5)

## 2019-06-14 MED ORDER — LOSARTAN POTASSIUM-HCTZ 50-12.5 MG PO TABS: 1.0000 | ORAL_TABLET | Freq: Every day | ORAL | 3 refills | Status: DC

## 2019-06-16 LAB — TSH: TSH: 1.48 u[IU]/mL (ref 0.35–4.50)

## 2019-06-30 ENCOUNTER — Other Ambulatory Visit: Payer: Self-pay | Admitting: Internal Medicine

## 2019-07-18 ENCOUNTER — Other Ambulatory Visit: Payer: Self-pay | Admitting: Internal Medicine

## 2019-08-09 ENCOUNTER — Other Ambulatory Visit: Payer: Self-pay

## 2019-08-10 MED ORDER — ALBUTEROL SULFATE HFA 108 (90 BASE) MCG/ACT IN AERS: 2.0000 | INHALATION_SPRAY | Freq: Four times a day (QID) | RESPIRATORY_TRACT | 1 refills | Status: DC | PRN

## 2019-12-21 ENCOUNTER — Other Ambulatory Visit: Payer: Self-pay | Admitting: Internal Medicine

## 2020-03-23 ENCOUNTER — Other Ambulatory Visit: Payer: Self-pay | Admitting: Internal Medicine

## 2020-04-03 NOTE — Telephone Encounter (Signed)
Please do referral to pulmonary for sleep evaluation

## 2020-04-04 ENCOUNTER — Other Ambulatory Visit: Payer: Self-pay

## 2020-04-04 DIAGNOSIS — G479 Sleep disorder, unspecified: Secondary | ICD-10-CM

## 2020-04-04 DIAGNOSIS — R0683 Snoring: Secondary | ICD-10-CM

## 2020-04-05 ENCOUNTER — Other Ambulatory Visit: Payer: Self-pay

## 2020-04-05 ENCOUNTER — Encounter: Payer: Self-pay | Admitting: Pulmonary Disease

## 2020-04-05 ENCOUNTER — Ambulatory Visit (INDEPENDENT_AMBULATORY_CARE_PROVIDER_SITE_OTHER): Payer: 59 | Admitting: Pulmonary Disease

## 2020-04-05 VITALS — BP 118/66 | HR 75 | Temp 98.3°F | Ht 64.0 in | Wt 271.0 lb

## 2020-04-05 DIAGNOSIS — G4733 Obstructive sleep apnea (adult) (pediatric): Secondary | ICD-10-CM

## 2020-04-05 NOTE — Patient Instructions (Signed)
Moderate probability of significant obstructive sleep apnea  We will schedule you for home sleep study Update you with results as soon as reviewed  Treatment options as we discussed  Call with significant concerns  Follow-up tentatively in about 3 months Sleep Apnea Sleep apnea affects breathing during sleep. It causes breathing to stop for a short time or to become shallow. It can also increase the risk of:  Heart attack.  Stroke.  Being very overweight (obese).  Diabetes.  Heart failure.  Irregular heartbeat. The goal of treatment is to help you breathe normally again. What are the causes? There are three kinds of sleep apnea:  Obstructive sleep apnea. This is caused by a blocked or collapsed airway.  Central sleep apnea. This happens when the brain does not send the right signals to the muscles that control breathing.  Mixed sleep apnea. This is a combination of obstructive and central sleep apnea. The most common cause of this condition is a collapsed or blocked airway. This can happen if:  Your throat muscles are too relaxed.  Your tongue and tonsils are too large.  You are overweight.  Your airway is too small. What increases the risk?  Being overweight.  Smoking.  Having a small airway.  Being older.  Being female.  Drinking alcohol.  Taking medicines to calm yourself (sedatives or tranquilizers).  Having family members with the condition. What are the signs or symptoms?  Trouble staying asleep.  Being sleepy or tired during the day.  Getting angry a lot.  Loud snoring.  Headaches in the morning.  Not being able to focus your mind (concentrate).  Forgetting things.  Less interest in sex.  Mood swings.  Personality changes.  Feelings of sadness (depression).  Waking up a lot during the night to pee (urinate).  Dry mouth.  Sore throat. How is this diagnosed?  Your medical history.  A physical exam.  A test that is done  when you are sleeping (sleep study). The test is most often done in a sleep lab but may also be done at home. How is this treated?   Sleeping on your side.  Using a medicine to get rid of mucus in your nose (decongestant).  Avoiding the use of alcohol, medicines to help you relax, or certain pain medicines (narcotics).  Losing weight, if needed.  Changing your diet.  Not smoking.  Using a machine to open your airway while you sleep, such as: ? An oral appliance. This is a mouthpiece that shifts your lower jaw forward. ? A CPAP device. This device blows air through a mask when you breathe out (exhale). ? An EPAP device. This has valves that you put in each nostril. ? A BPAP device. This device blows air through a mask when you breathe in (inhale) and breathe out.  Having surgery if other treatments do not work. It is important to get treatment for sleep apnea. Without treatment, it can lead to:  High blood pressure.  Coronary artery disease.  In men, not being able to have an erection (impotence).  Reduced thinking ability. Follow these instructions at home: Lifestyle  Make changes that your doctor recommends.  Eat a healthy diet.  Lose weight if needed.  Avoid alcohol, medicines to help you relax, and some pain medicines.  Do not use any products that contain nicotine or tobacco, such as cigarettes, e-cigarettes, and chewing tobacco. If you need help quitting, ask your doctor. General instructions  Take over-the-counter and prescription medicines only  as told by your doctor.  If you were given a machine to use while you sleep, use it only as told by your doctor.  If you are having surgery, make sure to tell your doctor you have sleep apnea. You may need to bring your device with you.  Keep all follow-up visits as told by your doctor. This is important. Contact a doctor if:  The machine that you were given to use during sleep bothers you or does not seem to be  working.  You do not get better.  You get worse. Get help right away if:  Your chest hurts.  You have trouble breathing in enough air.  You have an uncomfortable feeling in your back, arms, or stomach.  You have trouble talking.  One side of your body feels weak.  A part of your face is hanging down. These symptoms may be an emergency. Do not wait to see if the symptoms will go away. Get medical help right away. Call your local emergency services (911 in the [place] Do not drive yourself to the hospital. Summary  This condition affects breathing during sleep.  The most common cause is a collapsed or blocked airway.  The goal of treatment is to help you breathe normally while you sleep. This information is not intended to replace advice given to you by your health care provider. Make sure you discuss any questions you have with your health care provider. Document Revised: 05/21/2018 Document Reviewed: 03/30/2018 Elsevier Patient Education  [place].

## 2020-04-05 NOTE — Progress Notes (Signed)
Danielle Martin    [id number]    [birth year]  Primary Care Physician:Panosh, Standley Brooking, MD  Referring Physician: Burnis Medin, MD [place],  [place] [address]  Chief complaint:   Patient with a history of snoring, difficulty falling asleep and staying asleep Always tired  HPI:  Concern for sleep disordered breathing She has a history of snoring, no witnessed apneas First time she heard about snoring was over 10 to 12 years ago Wakes up with a dry mouth No morning headaches Does not take naps during the day No family history of obstructive sleep apnea Sleep environment is conducive to sleep  Usually goes to bed bed between 11 and 12 8 AM, takes about 25 minutes to 45 minutes to fall asleep Usually 6-7 awakenings Final wake up time about 6 AM  Memory is good  Pets: No pets   Outpatient Encounter Medications as of 04/05/2020  Medication Sig  . albuterol (PROVENTIL) (2.5 MG/3ML) 0.083% nebulizer solution Take 3 mLs (2.5 mg total) by nebulization every 6 (six) hours as needed for wheezing or shortness of breath.  Marland Kitchen albuterol (VENTOLIN HFA) 108 (90 Base) MCG/ACT inhaler Inhale 2 puffs into the lungs every 6 (six) hours as needed for wheezing.  . Calcium Carbonate-Vitamin D 600-400 MG-UNIT per chew tablet Chew 1 tablet by mouth daily.  Marland Kitchen CINNAMON PO Take 1,000 Units by mouth every morning.  . escitalopram (LEXAPRO) 10 MG tablet TAKE 1 TABLET BY MOUTH EVERY DAY  . metoprolol succinate (TOPROL-XL) 50 MG 24 hr tablet TAKE 1 TABLET DAILY. TAKE  WITH OR IMMEDIATELY        FOLLOWING A MEAL.  . Multiple Vitamin (MULTI-VITAMIN PO) Take 1 tablet by mouth daily.   . vitamin E 400 UNIT capsule Take 400 Units by mouth daily.  Marland Kitchen LORazepam (ATIVAN) 0.5 MG tablet 1 po if needed,  Up to  Every  8 hours  For anxiety (Patient not taking: Reported on 04/05/2020)  . losartan-hydrochlorothiazide (HYZAAR) 50-12.5 MG tablet Take 1 tablet by mouth daily.   No  facility-administered encounter medications on file as of 04/05/2020.    Allergies as of 04/05/2020 - Review Complete 04/05/2020  Allergen Reaction Noted  . Codeine Anaphylaxis and Other (See Comments) 12/25/2010  . Ace inhibitors Cough 01/16/2015    Past Medical History:  Diagnosis Date  . Allergic rhinitis    Worse in winter , grasses  . Asthma    Worse in winter  . Gall stone pancreatitis    lap choley 1 2017  . GERD (gastroesophageal reflux disease)   . CNOBSJGG(836.6)    "bi-weekly" (09/03/2015)  . HEMORRHAGIC CYSTITIS 05/25/2009   Qualifier: Diagnosis of  By: Regis Bill MD, Standley Brooking   . Hypertension   . Iron deficiency anemia 05/01/2015  . Migraine    "q couple months" (09/03/2015)  . Pneumonia    "a few times" (09/03/2015)  . SVT (supraventricular tachycardia) (HCC)    No longer a problem    Past Surgical History:  Procedure Laterality Date  . ANTERIOR CERVICAL DECOMP/DISCECTOMY FUSION  2008  . BACK SURGERY    . CESAREAN SECTION  1990; 1991  . CHOLECYSTECTOMY N/A 09/04/2015   Procedure: LAPAROSCOPIC CHOLECYSTECTOMY WITH INTRAOPERATIVE CHOLANGIOGRAM;  Surgeon: Greer Pickerel, MD;  Location: [facility];  Service: General;  Laterality: N/A;  . OOPHORECTOMY  1980s   "all of the right; part of the left"  . VAGINAL HYSTERECTOMY  2010  Family History  Problem Relation Age of Onset  . Other Mother        Died of asphyxiation  . Prostate cancer Father     Social History   Socioeconomic History  . Marital status: Married    Spouse name: Not on file  . Number of children: Not on file  . Years of education: Not on file  . Highest education level: Not on file  Occupational History  . Not on file  Tobacco Use  . Smoking status: Never Smoker  . Smokeless tobacco: Never Used  . Tobacco comment: "stopped smoking in the 1980s"  Vaping Use  . Vaping Use: Never used  Substance and Sexual Activity  . Alcohol use: Yes    Alcohol/week: 8.0 standard drinks    Types: 4 Glasses of  wine, 4 Shots of liquor per week  . Drug use: No  . Sexual activity: Yes  Other Topics Concern  . 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 Difficulty of Paying Living Expenses: Not on file  Food Insecurity:   . Worried About Charity fundraiser in the Last Year: Not on file  . Ran Out of Food in the Last Year: Not on file  Transportation Needs:   . Lack of Transportation (Medical): Not on file  . Lack of Transportation (Non-Medical): Not on file  Physical Activity:   . Days of Exercise per Week: Not on file  . Minutes of Exercise per Session: Not on file  Stress:   . Feeling of Stress : Not on file  Social Connections:   . Frequency of Communication with Friends and Family: Not on file  . Frequency of Social Gatherings with Friends and Family: Not on file  . Attends Religious Services: Not on file  . Active Member of Clubs or Organizations: Not on file  . Attends Archivist Meetings: Not on file  . Marital Status: Not on file  Intimate Partner Violence:   . Fear of Current or Ex-Partner: Not on file  . Emotionally Abused: Not on file  . Physically Abused: Not on file  . Sexually Abused: Not on file    Review of Systems  Constitutional: Negative.   HENT: Negative.   Eyes: Negative.   Respiratory: Negative.   Cardiovascular: Negative for chest pain.  Psychiatric/Behavioral: Positive for sleep disturbance.    Vitals:   04/05/20 1435  BP: 118/66  Pulse: 75  Temp: 98.3 F (36.8 C)  SpO2: 96%   Physical Exam Constitutional:      Appearance: She is obese.  HENT:     Head: Normocephalic.     Mouth/Throat:     Mouth: Mucous membranes are moist.  Eyes:     Pupils: Pupils are equal,  round, and reactive to light.  Cardiovascular:     Rate and Rhythm: Normal rate and regular rhythm.     Pulses: Normal pulses.     Heart sounds: No murmur heard.  No friction rub.  Pulmonary:     Effort: Pulmonary effort is normal. No respiratory distress.  Breath sounds: Normal breath sounds. No stridor. No wheezing or rhonchi.  Musculoskeletal:        General: Normal range of motion.     Cervical back: No rigidity.  Skin:    General: Skin is warm.  Neurological:     General: No focal deficit present.     Mental Status: She is alert.    Results of the Epworth flowsheet 04/05/2020  Sitting and reading 2  Watching TV 3  Sitting, inactive in a public place (e.g. a theatre or a meeting) 1  As a passenger in a car for an hour without a break 3  Lying down to rest in the afternoon when circumstances permit 3  Sitting and talking to someone 0  Sitting quietly after a lunch without alcohol 1  In a car, while stopped for a few minutes in traffic 0  Total score 13    Assessment:  Moderate probability of significant obstructive sleep apnea  Obesity  Excessive daytime sleepiness  Pathophysiology of sleep disordered breathing discussed with the patient Treatment options for sleep disordered breathing discussed with the patient  Plan/Recommendations: Risks of not treating sleep disordered breathing discussed  We will schedule the patient for home sleep study We will follow-up in about 3 months  Encouraged to focus on weight loss and graded exercises  Call with significant concerns   Sherrilyn Rist MD [facility] 04/05/2020, 2:48 PM  CC: Panosh, Standley Brooking, MD

## 2020-04-10 ENCOUNTER — Ambulatory Visit: Payer: 59 | Admitting: Internal Medicine

## 2020-05-08 ENCOUNTER — Other Ambulatory Visit: Payer: Self-pay | Admitting: Internal Medicine

## 2020-05-26 ENCOUNTER — Other Ambulatory Visit: Payer: Self-pay | Admitting: Internal Medicine

## 2020-06-14 ENCOUNTER — Other Ambulatory Visit: Payer: Self-pay | Admitting: Internal Medicine

## 2020-06-14 NOTE — Progress Notes (Signed)
Chief Complaint  Patient presents with  . Annual Exam    HPI: Patient  Danielle Martin  59 y.o. comes in today for Preventive Health Care visit    To get overnight sleep test.  fo osa  Hysterectomy . No gyne non cancer reasons  Bp ok :  Needs refill  No need for inhalers for a year.   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 HIV Screening  Never done  . MAMMOGRAM  07/28/2020  . TETANUS/TDAP  12/24/2020  . COLONOSCOPY  02/19/2021  . INFLUENZA VACCINE  Completed  . COVID-19 Vaccine  Completed  . 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 GI /GU: No adominal pain, vomiting, change in bowel habits. No blood in the stool. No significant GU symptoms. SKIN/HEME: ,no acute skin rashes suspicious lesions or bleeding. No lymphadenopathy, nodules, masses.  NEURO/ PSYCH:  No neurologic signs such as weakness numbness. No depression anxiety. IMM/ Allergy: No unusual infections.  Allergy .   REST of 12 system review negative except as per HPI   Past Medical History:  Diagnosis Date  . Allergic rhinitis    Worse in winter , grasses  . Asthma    Worse in winter  . Gall stone pancreatitis    lap choley 1 2017  . GERD (gastroesophageal reflux disease)   . [id number](086.7)    "bi-weekly" (09/03/2015)  . HEMORRHAGIC CYSTITIS 05/25/2009   Qualifier: Diagnosis of  By: Regis Bill MD, Standley Brooking   . Hypertension   . Iron deficiency anemia 05/01/2015  .  Migraine    "q couple months" (09/03/2015)  . Pneumonia    "a few times" (09/03/2015)  . SVT (supraventricular tachycardia) (HCC)    No longer a problem    Past Surgical History:  Procedure Laterality Date  . ANTERIOR CERVICAL DECOMP/DISCECTOMY FUSION  2008  . BACK SURGERY    . CESAREAN SECTION  1990; 1991  . CHOLECYSTECTOMY N/A 09/04/2015   Procedure: LAPAROSCOPIC CHOLECYSTECTOMY WITH INTRAOPERATIVE CHOLANGIOGRAM;  Surgeon: Greer Pickerel, MD;  Location: [facility];  Service: General;  Laterality: N/A;  . OOPHORECTOMY  1980s   "all of the right; part of the left"  . VAGINAL HYSTERECTOMY  2010    Family History  Problem Relation Age of Onset  . Other Mother        Died of asphyxiation  . Prostate cancer Father     Social History   Socioeconomic History  . Marital status: Married    Spouse name: Not on file  . Number of children: Not on file  . Years of education: Not on file  . Highest education level: Not on file  Occupational History  . Not on file  Tobacco Use  . Smoking status: Never Smoker  . Smokeless tobacco: Never Used  . Tobacco comment: "stopped smoking in the 1980s"  Vaping Use  . Vaping Use: Never used  Substance and Sexual Activity  . Alcohol use: Yes    Alcohol/week: 8.0 standard drinks    Types: 4 Glasses of wine, 4 Shots of liquor per week  . Drug use: No  . Sexual activity: Yes  Other Topics Concern  . 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 Difficulty of Paying Living Expenses: Not on file  Food Insecurity:   . Worried About Charity fundraiser in the Last Year: Not on file  . Ran Out of Food in the Last Year: Not on file  Transportation Needs:   . Lack of Transportation  (Medical): Not on file  . Lack of Transportation (Non-Medical): Not on file  Physical Activity:   . Days of Exercise per Week: Not on file  . Minutes of Exercise per Session: Not on file  Stress:   . Feeling of Stress : Not on file  Social Connections:   . Frequency of Communication with Friends and Family: Not on file  . Frequency of Social Gatherings with Friends and Family: Not on file  . Attends Religious Services: Not on file  . Active Member of Clubs or Organizations: Not on file  . Attends Archivist Meetings: Not on file  . Marital Status: Not on file    Outpatient Medications Prior to Visit  Medication Sig Dispense Refill  . Calcium Carbonate-Vitamin D 600-400 MG-UNIT per chew tablet Chew 1 tablet by mouth daily.    Marland Kitchen CINNAMON PO Take 1,000 Units by mouth every morning.    . Multiple Vitamin (MULTI-VITAMIN PO) Take 1 tablet by mouth daily.     . vitamin E 400 UNIT capsule Take 400 Units by mouth daily.    Marland Kitchen escitalopram (LEXAPRO) 10 MG tablet TAKE 1 TABLET BY MOUTH EVERY DAY 90 tablet 1  . losartan-hydrochlorothiazide (HYZAAR) 50-12.5 MG tablet TAKE 1 TABLET DAILY 90 tablet 0  . metoprolol succinate (TOPROL-XL) 50 MG 24 hr tablet TAKE 1 TABLET DAILY. TAKE  WITH OR IMMEDIATELY        FOLLOWING A MEAL. 90 tablet 0  . albuterol (PROVENTIL) (2.5 MG/3ML) 0.083% nebulizer solution Take 3 mLs (2.5 mg total) by nebulization every 6 (six) hours as needed for wheezing or shortness of breath. (Patient not taking: Reported on 06/15/2020) 150 mL 1  . albuterol (VENTOLIN HFA) 108 (90 Base) MCG/ACT inhaler Inhale 2 puffs into the lungs every 6 (six) hours as needed for wheezing. (Patient not taking: Reported on 06/15/2020) 18 g 1  . LORazepam (ATIVAN) 0.5 MG tablet 1 po if needed,  Up to  Every  8 hours  For anxiety (Patient not taking: Reported on 06/15/2020) 20 tablet 0   No facility-administered medications prior to visit.     EXAM:  BP 138/86 (BP Location: Left Arm, Patient  Position: Sitting, Cuff Size: Normal)   Pulse 74   Temp 97.8 F (36.6 C) (Oral)   Ht 5\' 4"  (1.626 m)   Wt 271 lb (122.9 kg)   SpO2 98%   BMI 46.52 kg/m   Body mass index is 46.52 kg/m. Wt Readings  from Last 3 Encounters:  06/15/20 271 lb (122.9 kg)  04/05/20 271 lb (122.9 kg)  06/14/19 270 lb (122.5 kg)    Physical Exam: Vital signs reviewed HYW:VPXT is a well-developed well-nourished alert cooperative    who appearsr stated age in no acute distress.  HEENT: normocephalic atraumatic , Eyes: PERRL EOM's full, conjunctiva clear, Nares: paten,t no deformity discharge or tenderness., Ears: no deformity EAC's clear TMs with normal landmarks. Mouth:masked  NECK: supple without masses, thyromegaly or bruits. CHEST/PULM:  Clear to auscultation and percussion breath sounds equal no wheeze , rales or rhonchi. No chest wall deformities or tenderness. Breast: normal by inspection . No dimpling, discharge, masses, tenderness or discharge . CV: PMI is nondisplaced, S1 S2 no gallops, murmurs, rubs. Peripheral pulses are full without delay.No JVD .  ABDOMEN: Bowel sounds normal nontender  No guard or rebound, no hepato splenomegal no CVA tenderness.   Extremtities:  No clubbing cyanosis or edema, no acute joint swelling or redness no focal atrophy NEURO:  Oriented x3, cranial nerves 3-12 appear to be intact, no obvious focal weakness,gait within normal limits no abnormal reflexes or asymmetrical SKIN: No acute rashes normal turgor, color, no bruising or petechiae. PSYCH: Oriented, good eye contact, no obvious depression anxiety, cognition and judgment appear normal. LN: no cervical axillary inguinal adenopathy  Lab Results  Component Value Date   WBC 7.6 06/14/2019   HGB 13.6 06/14/2019   HCT 41.5 06/14/2019   PLT 411.0 (H) 06/14/2019   GLUCOSE 102 (H) 06/14/2019   CHOL 202 (H) 06/14/2019   TRIG 164.0 (H) 06/14/2019   HDL 46.70 06/14/2019   LDLDIRECT 115.0 07/23/2018   LDLCALC 122 (H)  06/14/2019   ALT 15 06/14/2019   AST 17 06/14/2019   NA 141 06/14/2019   K 4.8 06/14/2019   CL 102 06/14/2019   CREATININE 0.61 06/14/2019   BUN 20 06/14/2019   CO2 33 (H) 06/14/2019   TSH 1.48 06/14/2019   INR 1.0 01/24/2009   HGBA1C 5.8 06/14/2019    BP Readings from Last 3 Encounters:  06/15/20 138/86  04/05/20 118/66  06/14/19 (!) 160/94    Lab rplan reviewed with patient  Fasting today   ASSESSMENT AND PLAN:  Discussed the following assessment and plan:    ICD-10-CM   1. Visit for preventive health examination  Z00.00 MM Digital Screening    BASIC METABOLIC PANEL WITH GFR    Lipid panel    Hepatic function panel    TSH    CBC with Differential/Platelet    Hemoglobin A1c    Hemoglobin A1c    CBC with Differential/Platelet    TSH    Hepatic function panel    Lipid panel    BASIC METABOLIC PANEL WITH GFR  2. Medication management  G62.694 BASIC METABOLIC PANEL WITH GFR    Lipid panel    Hepatic function panel    TSH    CBC with Differential/Platelet    Hemoglobin A1c    Hemoglobin A1c    CBC with Differential/Platelet    TSH    Hepatic function panel    Lipid panel    BASIC METABOLIC PANEL WITH GFR   cont lexapro for now  3. Sleep disturbances  W54.6 BASIC METABOLIC PANEL WITH GFR    Lipid panel    Hepatic function panel    TSH    CBC with Differential/Platelet    Hemoglobin A1c    Hemoglobin A1c    CBC with Differential/Platelet  TSH    Hepatic function panel    Lipid panel    BASIC METABOLIC PANEL WITH GFR   poss OSA  waiting for eval  4. Essential hypertension  U13 BASIC METABOLIC PANEL WITH GFR    Lipid panel    Hepatic function panel    TSH    CBC with Differential/Platelet    Hemoglobin A1c    Hemoglobin A1c    CBC with Differential/Platelet    TSH    Hepatic function panel    Lipid panel    BASIC METABOLIC PANEL WITH GFR  5. Thrombocytosis  K44.010 BASIC METABOLIC PANEL WITH GFR    Lipid panel    Hepatic function panel    TSH     CBC with Differential/Platelet    Hemoglobin A1c    Hemoglobin A1c    CBC with Differential/Platelet    TSH    Hepatic function panel    Lipid panel    BASIC METABOLIC PANEL WITH GFR  6. Hyperlipidemia, unspecified hyperlipidemia type  U72.5 BASIC METABOLIC PANEL WITH GFR    Lipid panel    Hepatic function panel    TSH    CBC with Differential/Platelet    Hemoglobin A1c    Hemoglobin A1c    CBC with Differential/Platelet    TSH    Hepatic function panel    Lipid panel    BASIC METABOLIC PANEL WITH GFR  7. Screening mammogram for breast cancer  Z12.31 MM Digital Screening  8. Morbid obesity ([place])  E66.01    Return for 6- 12 months  , depending on results. Offered   Referral to weight management  aware of stress habits . bp continue control   Refilled today   Cont lexapro  Patient Care Team: Sanel Stemmer, Standley Brooking, MD as PCP - General Marybelle Killings, MD as Attending Physician (Orthopedic Surgery) Patient Instructions  Will notify you  of labs when available.   Let us know if you want [place] to do a weight management  Referral.   Get your mammogram.    Health Maintenance, Female Adopting a healthy lifestyle and getting preventive care are important in promoting health and wellness. Ask your health care provider about:  The right schedule for you to have regular tests and exams.  Things you can do on your own to prevent diseases and keep yourself healthy. What should I know about diet, weight, and exercise? Eat a healthy diet   Eat a diet that includes plenty of vegetables, fruits, low-fat dairy products, and lean protein.  Do not eat a lot of foods that are high in solid fats, added sugars, or sodium. Maintain a healthy weight Body mass index (BMI) is used to identify weight problems. It estimates body fat based on height and weight. Your health care provider can help determine your BMI and help you achieve or maintain a healthy weight. Get regular exercise Get regular  exercise. This is one of the most important things you can do for your health. Most adults should:  Exercise for at least 150 minutes each week. The exercise should increase your heart rate and make you sweat (moderate-intensity exercise).  Do strengthening exercises at least twice a week. This is in addition to the moderate-intensity exercise.  Spend less time sitting. Even light physical activity can be beneficial. Watch cholesterol and blood lipids Have your blood tested for lipids and cholesterol at 57 years of age, then have this test every 5 years. Have your cholesterol levels checked  more often if:  Your lipid or cholesterol levels are high.  You are older than 57 years of age.  You are at high risk for heart disease. What should I know about cancer screening? Depending on your health history and family history, you may need to have cancer screening at various ages. This may include screening for:  Breast cancer.  Cervical cancer.  Colorectal cancer.  Skin cancer.  Lung cancer. What should I know about heart disease, diabetes, and high blood pressure? Blood pressure and heart disease  High blood pressure causes heart disease and increases the risk of stroke. This is more likely to develop in people who have high blood pressure readings, are of African descent, or are overweight.  Have your blood pressure checked: ? Every 3-5 years if you are 86-72 years of age. ? Every year if you are 25 years old or older. Diabetes Have regular diabetes screenings. This checks your fasting blood sugar level. Have the screening done:  Once every three years after age 45 if you are at a normal weight and have a low risk for diabetes.  More often and at a younger age if you are overweight or have a high risk for diabetes. What should I know about preventing infection? Hepatitis B If you have a higher risk for hepatitis B, you should be screened for this virus. Talk with your health  care provider to find out if you are at risk for hepatitis B infection. Hepatitis C Testing is recommended for:  Everyone born from [birth year] through 1963.  Anyone with known risk factors for hepatitis C. Sexually transmitted infections (STIs)  Get screened for STIs, including gonorrhea and chlamydia, if: ? You are sexually active and are younger than 57 years of age. ? You are older than 57 years of age and your health care provider tells you that you are at risk for this type of infection. ? Your sexual activity has changed since you were last screened, and you are at increased risk for chlamydia or gonorrhea. Ask your health care provider if you are at risk.  Ask your health care provider about whether you are at high risk for HIV. Your health care provider may recommend a prescription medicine to help prevent HIV infection. If you choose to take medicine to prevent HIV, you should first get tested for HIV. You should then be tested every 3 months for as long as you are taking the medicine. Pregnancy  If you are about to stop having your period (premenopausal) and you may become pregnant, seek counseling before you get pregnant.  Take 400 to 800 micrograms (mcg) of folic acid every day if you become pregnant.  Ask for birth control (contraception) if you want to prevent pregnancy. Osteoporosis and menopause Osteoporosis is a disease in which the bones lose minerals and strength with aging. This can result in bone fractures. If you are 69 years old or older, or if you are at risk for osteoporosis and fractures, ask your health care provider if you should:  Be screened for bone loss.  Take a calcium or vitamin D supplement to lower your risk of fractures.  Be given hormone replacement therapy (HRT) to treat symptoms of menopause. Follow these instructions at home: Lifestyle  Do not use any products that contain nicotine or tobacco, such as cigarettes, e-cigarettes, and chewing tobacco.  If you need help quitting, ask your health care provider.  Do not use street drugs.  Do not share needles.  Ask your health care provider for help if you need support or information about quitting drugs. Alcohol use  Do not drink alcohol if: ? Your health care provider tells you not to drink. ? You are pregnant, may be pregnant, or are planning to become pregnant.  If you drink alcohol: ? Limit how much you use to 0-1 drink a day. ? Limit intake if you are breastfeeding.  Be aware of how much alcohol is in your drink. In the [place] one drink equals one 12 oz bottle of beer (355 mL), one 5 oz glass of wine (148 mL), or one 1 oz glass of hard liquor (44 mL). General instructions  Schedule regular health, dental, and eye exams.  Stay current with your vaccines.  Tell your health care provider if: ? You often feel depressed. ? You have ever been abused or do not feel safe at home. Summary  Adopting a healthy lifestyle and getting preventive care are important in promoting health and wellness.  Follow your health care provider's instructions about healthy diet, exercising, and getting tested or screened for diseases.  Follow your health care provider's instructions on monitoring your cholesterol and blood pressure. This information is not intended to replace advice given to you by your health care provider. Make sure you discuss any questions you have with your health care provider. Document Revised: 07/28/2018 Document Reviewed: 07/28/2018 Elsevier Patient Education  2020 [place] Rucha Wissinger M.D.

## 2020-06-15 ENCOUNTER — Other Ambulatory Visit: Payer: Self-pay

## 2020-06-15 ENCOUNTER — Ambulatory Visit (INDEPENDENT_AMBULATORY_CARE_PROVIDER_SITE_OTHER): Payer: 59 | Admitting: Internal Medicine

## 2020-06-15 ENCOUNTER — Encounter: Payer: Self-pay | Admitting: Internal Medicine

## 2020-06-15 VITALS — BP 138/86 | HR 74 | Temp 97.8°F | Ht 64.0 in | Wt 271.0 lb

## 2020-06-15 DIAGNOSIS — Z79899 Other long term (current) drug therapy: Secondary | ICD-10-CM | POA: Diagnosis not present

## 2020-06-15 DIAGNOSIS — D75839 Thrombocytosis, unspecified: Secondary | ICD-10-CM | POA: Diagnosis not present

## 2020-06-15 DIAGNOSIS — G479 Sleep disorder, unspecified: Secondary | ICD-10-CM | POA: Diagnosis not present

## 2020-06-15 DIAGNOSIS — Z23 Encounter for immunization: Secondary | ICD-10-CM | POA: Diagnosis not present

## 2020-06-15 DIAGNOSIS — I10 Essential (primary) hypertension: Secondary | ICD-10-CM

## 2020-06-15 DIAGNOSIS — Z1231 Encounter for screening mammogram for malignant neoplasm of breast: Secondary | ICD-10-CM

## 2020-06-15 DIAGNOSIS — Z0000 Encounter for general adult medical examination without abnormal findings: Secondary | ICD-10-CM | POA: Diagnosis not present

## 2020-06-15 DIAGNOSIS — E785 Hyperlipidemia, unspecified: Secondary | ICD-10-CM

## 2020-06-15 MED ORDER — METOPROLOL SUCCINATE ER 50 MG PO TB24: ORAL_TABLET | ORAL | 3 refills | Status: DC

## 2020-06-15 MED ORDER — LOSARTAN POTASSIUM-HCTZ 50-12.5 MG PO TABS: 1.0000 | ORAL_TABLET | Freq: Every day | ORAL | 3 refills | Status: DC

## 2020-06-15 NOTE — Patient Instructions (Signed)
Will notify you  of labs when available.   Let us know if you want [place] to do a weight management  Referral.   Get your mammogram.    Health Maintenance, Female Adopting a healthy lifestyle and getting preventive care are important in promoting health and wellness. Ask your health care provider about:  The right schedule for you to have regular tests and exams.  Things you can do on your own to prevent diseases and keep yourself healthy. What should I know about diet, weight, and exercise? Eat a healthy diet   Eat a diet that includes plenty of vegetables, fruits, low-fat dairy products, and lean protein.  Do not eat a lot of foods that are high in solid fats, added sugars, or sodium. Maintain a healthy weight Body mass index (BMI) is used to identify weight problems. It estimates body fat based on height and weight. Your health care provider can help determine your BMI and help you achieve or maintain a healthy weight. Get regular exercise Get regular exercise. This is one of the most important things you can do for your health. Most adults should:  Exercise for at least 150 minutes each week. The exercise should increase your heart rate and make you sweat (moderate-intensity exercise).  Do strengthening exercises at least twice a week. This is in addition to the moderate-intensity exercise.  Spend less time sitting. Even light physical activity can be beneficial. Watch cholesterol and blood lipids Have your blood tested for lipids and cholesterol at 57 years of age, then have this test every 5 years. Have your cholesterol levels checked more often if:  Your lipid or cholesterol levels are high.  You are older than 57 years of age.  You are at high risk for heart disease. What should I know about cancer screening? Depending on your health history and family history, you may need to have cancer screening at various ages. This may include screening for:  Breast  cancer.  Cervical cancer.  Colorectal cancer.  Skin cancer.  Lung cancer. What should I know about heart disease, diabetes, and high blood pressure? Blood pressure and heart disease  High blood pressure causes heart disease and increases the risk of stroke. This is more likely to develop in people who have high blood pressure readings, are of African descent, or are overweight.  Have your blood pressure checked: ? Every 3-5 years if you are 43-107 years of age. ? Every year if you are 2 years old or older. Diabetes Have regular diabetes screenings. This checks your fasting blood sugar level. Have the screening done:  Once every three years after age 57 if you are at a normal weight and have a low risk for diabetes.  More often and at a younger age if you are overweight or have a high risk for diabetes. What should I know about preventing infection? Hepatitis B If you have a higher risk for hepatitis B, you should be screened for this virus. Talk with your health care provider to find out if you are at risk for hepatitis B infection. Hepatitis C Testing is recommended for:  Everyone born from [birth year] through 1965.  Anyone with known risk factors for hepatitis C. Sexually transmitted infections (STIs)  Get screened for STIs, including gonorrhea and chlamydia, if: ? You are sexually active and are younger than 57 years of age. ? You are older than 57 years of age and your health care provider tells you that you are at risk  for this type of infection. ? Your sexual activity has changed since you were last screened, and you are at increased risk for chlamydia or gonorrhea. Ask your health care provider if you are at risk.  Ask your health care provider about whether you are at high risk for HIV. Your health care provider may recommend a prescription medicine to help prevent HIV infection. If you choose to take medicine to prevent HIV, you should first get tested for HIV. You should  then be tested every 3 months for as long as you are taking the medicine. Pregnancy  If you are about to stop having your period (premenopausal) and you may become pregnant, seek counseling before you get pregnant.  Take 400 to 800 micrograms (mcg) of folic acid every day if you become pregnant.  Ask for birth control (contraception) if you want to prevent pregnancy. Osteoporosis and menopause Osteoporosis is a disease in which the bones lose minerals and strength with aging. This can result in bone fractures. If you are 51 years old or older, or if you are at risk for osteoporosis and fractures, ask your health care provider if you should:  Be screened for bone loss.  Take a calcium or vitamin D supplement to lower your risk of fractures.  Be given hormone replacement therapy (HRT) to treat symptoms of menopause. Follow these instructions at home: Lifestyle  Do not use any products that contain nicotine or tobacco, such as cigarettes, e-cigarettes, and chewing tobacco. If you need help quitting, ask your health care provider.  Do not use street drugs.  Do not share needles.  Ask your health care provider for help if you need support or information about quitting drugs. Alcohol use  Do not drink alcohol if: ? Your health care provider tells you not to drink. ? You are pregnant, may be pregnant, or are planning to become pregnant.  If you drink alcohol: ? Limit how much you use to 0-1 drink a day. ? Limit intake if you are breastfeeding.  Be aware of how much alcohol is in your drink. In the [place] one drink equals one 12 oz bottle of beer (355 mL), one 5 oz glass of wine (148 mL), or one 1 oz glass of hard liquor (44 mL). General instructions  Schedule regular health, dental, and eye exams.  Stay current with your vaccines.  Tell your health care provider if: ? You often feel depressed. ? You have ever been abused or do not feel safe at home. Summary  Adopting a  healthy lifestyle and getting preventive care are important in promoting health and wellness.  Follow your health care provider's instructions about healthy diet, exercising, and getting tested or screened for diseases.  Follow your health care provider's instructions on monitoring your cholesterol and blood pressure. This information is not intended to replace advice given to you by your health care provider. Make sure you discuss any questions you have with your health care provider. Document Revised: 07/28/2018 Document Reviewed: 07/28/2018 Elsevier Patient Education  2020 [employer].

## 2020-06-16 LAB — HEPATIC FUNCTION PANEL
AG Ratio: 1.4 (calc) (ref 1.0–2.5)
ALT: 16 U/L (ref 6–29)
AST: 17 U/L (ref 10–35)
Albumin: 3.9 g/dL (ref 3.6–5.1)
Alkaline phosphatase (APISO): 48 U/L (ref 37–153)
Bilirubin, Direct: 0.1 mg/dL (ref 0.0–0.2)
Globulin: 2.8 g/dL (calc) (ref 1.9–3.7)
Indirect Bilirubin: 0.6 mg/dL (calc) (ref 0.2–1.2)
Total Bilirubin: 0.7 mg/dL (ref 0.2–1.2)
Total Protein: 6.7 g/dL (ref 6.1–8.1)

## 2020-06-16 LAB — CBC WITH DIFFERENTIAL/PLATELET
Absolute Monocytes: 631 cells/uL (ref 200–950)
Basophils Absolute: 57 cells/uL (ref 0–200)
Basophils Relative: 0.7 %
Eosinophils Absolute: 328 cells/uL (ref 15–500)
Eosinophils Relative: 4 %
HCT: 43.2 % (ref 35.0–45.0)
Hemoglobin: 13.4 g/dL (ref 11.7–15.5)
Lymphs Abs: 1714 cells/uL (ref 850–3900)
MCH: 26.7 pg — ABNORMAL LOW (ref 27.0–33.0)
MCHC: 31 g/dL — ABNORMAL LOW (ref 32.0–36.0)
MCV: 86.2 fL (ref 80.0–100.0)
MPV: 10.2 fL (ref 7.5–12.5)
Monocytes Relative: 7.7 %
Neutro Abs: 5469 cells/uL (ref 1500–7800)
Neutrophils Relative %: 66.7 %
Platelets: 422 10*3/uL — ABNORMAL HIGH (ref 140–400)
RBC: 5.01 10*6/uL (ref 3.80–5.10)
RDW: 14.4 % (ref 11.0–15.0)
Total Lymphocyte: 20.9 %
WBC: 8.2 10*3/uL (ref 3.8–10.8)

## 2020-06-16 LAB — BASIC METABOLIC PANEL WITH GFR
BUN: 24 mg/dL (ref 7–25)
CO2: 29 mmol/L (ref 20–32)
Calcium: 9 mg/dL (ref 8.6–10.4)
Chloride: 99 mmol/L (ref 98–110)
Creat: 0.76 mg/dL (ref 0.50–1.05)
GFR, Est African American: 101 mL/min/{1.73_m2} (ref >=60)
GFR, Est Non African American: 87 mL/min/{1.73_m2} (ref >=60)
Glucose, Bld: 109 mg/dL — ABNORMAL HIGH (ref 65–99)
Potassium: 4.5 mmol/L (ref 3.5–5.3)
Sodium: 139 mmol/L (ref 135–146)

## 2020-06-16 LAB — LIPID PANEL
Cholesterol: 168 mg/dL (ref <200)
HDL: 45 mg/dL — ABNORMAL LOW (ref >=50)
LDL Cholesterol (Calc): 96 mg/dL (calc)
Non-HDL Cholesterol (Calc): 123 mg/dL (calc) (ref <130)
Total CHOL/HDL Ratio: 3.7 (calc) (ref <5.0)
Triglycerides: 177 mg/dL — ABNORMAL HIGH (ref <150)

## 2020-06-16 LAB — HEMOGLOBIN A1C
Hgb A1c MFr Bld: 5.8 % of total Hgb — ABNORMAL HIGH (ref <5.7)
Mean Plasma Glucose: 120 (calc)
eAG (mmol/L): 6.6 (calc)

## 2020-06-16 LAB — TSH: TSH: 1.19 mIU/L (ref 0.40–4.50)

## 2020-06-21 ENCOUNTER — Telehealth: Payer: 59 | Admitting: Family

## 2020-06-21 DIAGNOSIS — N390 Urinary tract infection, site not specified: Secondary | ICD-10-CM

## 2020-06-21 MED ORDER — SULFAMETHOXAZOLE-TRIMETHOPRIM 800-160 MG PO TABS: 1.0000 | ORAL_TABLET | Freq: Two times a day (BID) | ORAL | 0 refills | Status: DC

## 2020-06-21 NOTE — Progress Notes (Signed)
We are sorry that you are not feeling well.  Here is how we plan to help!  Based on what you shared with me it looks like you most likely have a simple urinary tract infection.  A UTI (Urinary Tract Infection) is a bacterial infection of the bladder.  Most cases of urinary tract infections are simple to treat but a key part of your care is to encourage you to drink plenty of fluids and watch your symptoms carefully.  I have prescribed Bactrim DS One tablet twice a day for 5 days.  Your symptoms should gradually improve. Call us if the burning in your urine worsens, you develop worsening fever, back pain or pelvic pain or if your symptoms do not resolve after completing the antibiotic.  Urinary tract infections can be prevented by drinking plenty of water to keep your body hydrated.  Also be sure when you wipe, wipe from front to back and don't hold it in!  If possible, empty your bladder every 4 hours.  Your e-visit answers were reviewed by a board certified advanced clinical practitioner to complete your personal care plan.  Depending on the condition, your plan could have included both over the counter or prescription medications.  If there is a problem please reply  once you have received a response from your provider.  Your safety is important to [place].  If you have drug allergies check your prescription carefully.    You can use MyChart to ask questions about today's visit, request a non-urgent call back, or ask for a work or school excuse for 24 hours related to this e-Visit. If it has been greater than 24 hours you will need to follow up with your provider, or enter a new e-Visit to address those concerns.   You will get an e-mail in the next two days asking about your experience.  I hope that your e-visit has been valuable and will speed your recovery. Thank you for using e-visits.  Greater than 5 minutes, yet less than 10 minutes of time have been spent researching, coordinating, and  implementing care for this patient.

## 2020-06-21 NOTE — Progress Notes (Signed)
Results are stable  or normal   blood sugar borderline up . Continue attention  lifestyle intervention healthy eating and exercise . As we discussed   Lipids better than last year but  tg still out of range .  Ok to do yearly lab and cpx

## 2020-12-14 ENCOUNTER — Other Ambulatory Visit: Payer: Self-pay | Admitting: Internal Medicine

## 2021-01-10 ENCOUNTER — Other Ambulatory Visit: Payer: Self-pay

## 2021-01-10 ENCOUNTER — Ambulatory Visit: Payer: 59

## 2021-01-10 DIAGNOSIS — G4733 Obstructive sleep apnea (adult) (pediatric): Secondary | ICD-10-CM

## 2021-01-22 DIAGNOSIS — G4733 Obstructive sleep apnea (adult) (pediatric): Secondary | ICD-10-CM

## 2021-01-23 ENCOUNTER — Telehealth: Payer: Self-pay | Admitting: Pulmonary Disease

## 2021-01-23 DIAGNOSIS — G4733 Obstructive sleep apnea (adult) (pediatric): Secondary | ICD-10-CM

## 2021-01-23 NOTE — Telephone Encounter (Signed)
Call patient  Sleep study result  Date of study: 01/10/2021  Impression: Severe obstructive sleep apnea Severe oxygen desaturations  Recommendation: CPAP therapy for severe obstructive sleep apnea is recommended  An in lab titration study is recommended due to severity of events and severity of oxygen desaturations  Follow-up as previously scheduled

## 2021-01-25 NOTE — Telephone Encounter (Signed)
ATC, left VM for callback. 

## 2021-01-28 NOTE — Telephone Encounter (Signed)
ATC, left voicemail for callback.

## 2021-01-30 NOTE — Telephone Encounter (Signed)
Called and spoke with pt and she is aware of results.  Order for the in lab titration study has been ordered.

## 2021-02-05 ENCOUNTER — Other Ambulatory Visit: Payer: Self-pay | Admitting: Internal Medicine

## 2021-02-05 DIAGNOSIS — Z1231 Encounter for screening mammogram for malignant neoplasm of breast: Secondary | ICD-10-CM

## 2021-02-07 ENCOUNTER — Ambulatory Visit
Admission: RE | Admit: 2021-02-07 | Discharge: 2021-02-07 | Disposition: A | Discharge status: Home / Self Care | Payer: 59 | Source: Ambulatory Visit

## 2021-02-07 ENCOUNTER — Other Ambulatory Visit: Payer: Self-pay

## 2021-02-07 DIAGNOSIS — Z1231 Encounter for screening mammogram for malignant neoplasm of breast: Secondary | ICD-10-CM

## 2021-03-16 ENCOUNTER — Other Ambulatory Visit: Payer: Self-pay | Admitting: Internal Medicine

## 2021-04-08 ENCOUNTER — Other Ambulatory Visit: Payer: Self-pay

## 2021-04-08 ENCOUNTER — Ambulatory Visit (HOSPITAL_BASED_OUTPATIENT_CLINIC_OR_DEPARTMENT_OTHER): Payer: 59 | Attending: Pulmonary Disease | Admitting: Pulmonary Disease

## 2021-04-08 DIAGNOSIS — G4733 Obstructive sleep apnea (adult) (pediatric): Secondary | ICD-10-CM

## 2021-04-13 ENCOUNTER — Telehealth: Payer: Self-pay | Admitting: Pulmonary Disease

## 2021-04-13 NOTE — Procedures (Signed)
POLYSOMNOGRAPHY  Last, FirstKerria, Friederichs MRN: [id number] Gender: Female Age (years): 60 Weight (lbs): 250 DOB: Jan 15, 1961 BMI: 43 Primary Care: No PCP Epworth Score: 6 Referring: Laurin Coder MD Technician: Jorge Ny Interpreting: Laurin Coder MD Study Type: BiPAP Ordered Study Type: CPAP Study date: 04/08/2021 Location: [facility] CLINICAL INFORMATION Danielle Martin is a 60 year old Female and was referred to the sleep center for evaluation of G47.33 OSA: Adult and Pediatric (327.23). Indications include OSA.  MEDICATIONS Patient self administered medications include: N/A. Medications administered during study include No sleep medicine administered.  SLEEP STUDY TECHNIQUE The patient underwent an attended overnight polysomnography titration to assess the effects of BIPAP therapy. The following variables were monitored: EEG(C4-A1, C3-A2, O1-A2, O2-A1), EOG, submental and leg EMG, ECG, oxyhemoglobin saturation by pulse oximetry, thoracic and abdominal respiratory effort belts, nasal/oral airflow by pressure sensor, body position sensor and snoring sensor. BIPAP pressure was titrated to eliminate apneas, hypopneas and oxygen desaturation. Hypopneas were scored per AASM definition IB (4% desaturation)  TECHNICIAN COMMENTS Comments added by Technician: None Comments added by Scorer: N/A SLEEP ARCHITECTURE The study was initiated at 10:44:53 PM and terminated at 6:12:12 AM. Total recorded time was 447.3 minutes. EEG confirmed total sleep time was 358 minutes yielding a sleep efficiency of 80.0%%. Sleep onset after lights out was 22.6 minutes with a REM latency of 185.0 minutes. The patient spent 12.8%% of the night in stage N1 sleep, 60.9%% in stage N2 sleep, 0.0%% in stage N3 and 26.3% in REM. The Arousal Index was 28.2/hour. RESPIRATORY PARAMETERS The overall AHI was 40.2 per hour, and the RDI was 41.4 events/hour with a central apnea index of 2per hour. The most  appropriate setting of BiPAP was 21/17 cm H2O. At this setting, the sleep efficiency was 84 % and the patient was supine for 0%. The AHI was 6 events per hour, and the RDI was 6 events/hour (with 2 central events) and the arousal index was 6 per hour.The oxygen nadir was 82.0% during sleep.     The cumulative time under 88% oxygen saturation was 5.5 minutes  LEG MOVEMENT DATA The total leg movements were 69 with a resulting leg movement index of 11.6. Associated arousal with leg movement index was 0.8. CARDIAC DATA The underlying cardiac rhythm was most consistent with sinus rhythm. Mean heart rate during sleep was 62.4 bpm. Additional rhythm abnormalities include None.  IMPRESSIONS Severe Obstructive Sleep apnea(OSA) Optimal pressure attained. EKG showed no cardiac abnormalities. Severe Oxygen Desaturation The patient snored with moderate snoring volume. No significant periodic leg movements(PLMs) during sleep. However, no significant associated arousals.  DIAGNOSIS Severe Obstructive Sleep Apnea (G47.33)  RECOMMENDATIONS Trial of BiPAP therapy on 21/17 cm H2O with a Medium size Resmed Full Face Mask AirFit F20 mask and heated humidification. Avoid alcohol, sedatives and other CNS depressants that may worsen sleep apnea and disrupt normal sleep architecture. Sleep hygiene should be reviewed to assess factors that may improve sleep quality. Weight management and regular exercise should be initiated or continued. Return to Sleep Center for re-evaluation after 4 weeks of therapy  [Electronically signed] 04/13/2021 11:00 AM  Sherrilyn Rist MD NPI: [id number]

## 2021-04-13 NOTE — Telephone Encounter (Signed)
Call patient  Sleep study result  Date of study: 04/08/2021  Impression: Severe obstructive sleep apnea  Recommendation: DME referral  Recommend BiPAP therapy for moderate obstructive sleep apnea  Trial of BiPAP therapy on 21/17 cm H2O with a Medium size Resmed Full Face Mask AirFit F20 mask and heated humidification.  Encourage weight loss measures  Follow-up in the office 4 to 6 weeks following initiation of treatment

## 2021-04-17 NOTE — Telephone Encounter (Signed)
ATC, left VM. 

## 2021-04-23 NOTE — Telephone Encounter (Signed)
ATC, left VM. This is 2nd attempt, will mail letter and close encounter due to protocol.

## 2021-04-30 ENCOUNTER — Telehealth: Payer: Self-pay | Admitting: Pulmonary Disease

## 2021-04-30 DIAGNOSIS — G4733 Obstructive sleep apnea (adult) (pediatric): Secondary | ICD-10-CM

## 2021-04-30 NOTE — Telephone Encounter (Signed)
Called and spoke with patient. Advised her of Dr. Judson Roch recommendations. Patient agrees to start Bipap machine. I have placed the order.  Nothing further needed at this time.

## 2021-06-21 ENCOUNTER — Other Ambulatory Visit: Payer: Self-pay | Admitting: Internal Medicine

## 2021-07-08 NOTE — Progress Notes (Signed)
Chief Complaint  Patient presents with   Annual Exam   Medication Management   Hypertension     HPI: Patient  Danielle Martin  60 y.o. comes in today for Preventive Health Care visit and med follow-up No major changes in health  Bp taking medication no significant side effects has been in control Inhaler use rare   use breathing is fine controller medicine needed. Mood Lexapro seems to be helpful wants to continue very rare use of lorazepam. .  Has increased his her walking still battling struggling now getting her weight down.   Health Maintenance  Topic Date Due   HIV Screening  Never done   Zoster Vaccines- Shingrix (1 of 2) Never done   Pneumococcal Vaccine 76-52 Years old (2 - PPSV23 if available, else PCV20) 07/20/2013   TETANUS/TDAP  12/24/2020   COLONOSCOPY (Pts 45-6yrs Insurance coverage will need to be confirmed)  02/19/2021   INFLUENZA VACCINE  03/18/2021   COVID-19 Vaccine (4 - Booster for Moderna series) 05/15/2021   MAMMOGRAM  02/08/2023   Hepatitis C Screening  Completed   HPV VACCINES  Aged Out  Last: 2012 when she had a little bit of rectal bleeding with clean told 10-year follow-up may have gotten a letter recently negative family history Health Maintenance Review LIFESTYLE:  Exercise:   walking  in ams  Tobacco/ETS:n Alcohol:  ocass  Sugar beverages: mild sweet tea . 1 per day  Sleep: 6 +  Drug use: no HH of  2 no pets  Work:no schedules toime.     ROS:  REST of 12 system review negative except as per HPI no current new chest pain shortness of breath cardiovascular pulmonary or neurologic symptoms.   Past Medical History:  Diagnosis Date   Allergic rhinitis    Worse in winter , grasses   Asthma    Worse in winter   Gall stone pancreatitis    lap choley 1 2017   GERD (gastroesophageal reflux disease)    Headache(784.0)    "bi-weekly" (09/03/2015)   HEMORRHAGIC CYSTITIS 05/25/2009   Qualifier: Diagnosis of  By: Regis Bill MD, Standley Brooking     Hypertension    Iron deficiency anemia 05/01/2015   Migraine    "q couple months" (09/03/2015)   Pneumonia    "a few times" (09/03/2015)   SVT (supraventricular tachycardia) ([place])    No longer a problem    Past Surgical History:  Procedure Laterality Date   ANTERIOR CERVICAL DECOMP/DISCECTOMY FUSION  2008   [place]; 1991   CHOLECYSTECTOMY N/A 09/04/2015   Procedure: LAPAROSCOPIC CHOLECYSTECTOMY WITH INTRAOPERATIVE CHOLANGIOGRAM;  Surgeon: Greer Pickerel, MD;  Location: [facility];  Service: General;  Laterality: N/A;   OOPHORECTOMY  1980s   "all of the right; part of the left"   VAGINAL HYSTERECTOMY  2010    Family History  Problem Relation Age of Onset   Other Mother        Died of asphyxiation   Prostate cancer Father     Social History   Socioeconomic History   Marital status: Married    Spouse name: Not on file   Number of children: Not on file   Years of education: Not on file   Highest education level: Not on file  Occupational History   Not on file  Tobacco Use   Smoking status: Never   Smokeless tobacco: Never   Tobacco comments:    "stopped smoking in  the 1980s"  Vaping Use   Vaping Use: Never used  Substance and Sexual Activity   Alcohol use: Yes    Alcohol/week: 8.0 standard drinks    Types: 4 Glasses of wine, 4 Shots of liquor per week   Drug use: No   Sexual activity: Yes  Other Topics Concern   Not on file  Social History Narrative   Married   Employed [employer]  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 150 mL 1   albuterol (VENTOLIN HFA) 108 (90 Base) MCG/ACT inhaler Inhale 2 puffs into the lungs every 6 (six) hours as needed for wheezing. 18 g 1   Calcium Carbonate-Vitamin D 600-400 MG-UNIT per chew tablet Chew 1 tablet by mouth daily.     CINNAMON PO Take 1,000 Units by mouth every morning.     escitalopram (LEXAPRO) 10 MG tablet TAKE 1 TABLET BY MOUTH EVERY DAY 90 tablet 0   LORazepam (ATIVAN) 0.5 MG tablet 1 po if needed,  Up to  Every  8 hours  For anxiety 20 tablet 0   losartan-hydrochlorothiazide (HYZAAR) 50-12.5 MG tablet Take 1 tablet by mouth daily. 90 tablet 3   metoprolol succinate (TOPROL-XL) 50 MG 24 hr tablet Take with or immediately following a meal. 90 tablet 3   Multiple Vitamin (MULTI-VITAMIN PO) Take 1 tablet by mouth daily.      vitamin E 400 UNIT capsule Take 400 Units by mouth daily.     sulfamethoxazole-trimethoprim (BACTRIM DS) 800-160 MG tablet Take 1 tablet by mouth 2 (two) times daily. 10 tablet 0   No facility-administered medications prior to visit.     EXAM:  BP 110/70 (BP Location: Left Arm, Patient Position: Sitting, Cuff Size: Large)   Pulse 78   Temp 98.5 F (36.9 C) (Oral)   Ht 5\' 4"  (1.626 m)   Wt 251 lb 3.2 oz (113.9 kg)   SpO2 95%   BMI 43.12 kg/m   Body mass index is 43.12 kg/m. Wt Readings from Last 3 Encounters:  07/09/21 251 lb 3.2 oz (113.9 kg)  04/08/21 250 lb (113.4 kg)  06/15/20 271 lb (122.9 kg)    Physical Exam: Vital signs reviewed [id number] is a well-developed well-nourished alert cooperative    who appearsr stated age in no acute distress.  HEENT: normocephalic atraumatic , Eyes: PERRL EOM's full, conjunctiva clear, Nares: paten,t no deformity discharge or tenderness., Ears: no deformity EAC's clear TMs with normal landmarks. Mouth: masked   NECK: supple without masses, thyromegaly or bruits. CHEST/PULM:  Clear to auscultation and percussion breath sounds equal no wheeze , rales or rhonchi. No chest wall deformities or tenderness. Breast: normal by inspection . No dimpling, discharge, masses, tenderness or discharge . CV: PMI is nondisplaced, S1 S2  no gallops, murmurs, rubs. Peripheral pulses are full without delay.No JVD .  ABDOMEN: Bowel sounds normal nontender  No guard or rebound, no hepato splenomegal no CVA tenderness.   Extremtities:  No clubbing cyanosis or edema, no acute joint swelling or redness no focal atrophy NEURO:  Oriented x3, cranial nerves 3-12 appear to be intact, no obvious focal weakness,gait within normal limits no abnormal reflexes or asymmetrical SKIN: No acute rashes normal turgor, color, no bruising or petechiae. PSYCH: Oriented, good eye contact, no obvious depression anxiety, cognition and judgment appear normal. LN: no cervical axillary inguinal adenopathy  Lab Results  Component Value Date   WBC 8.2 06/15/2020   HGB 13.4 06/15/2020   HCT 43.2 06/15/2020   PLT 422 (H) 06/15/2020   GLUCOSE 109 (H) 06/15/2020   CHOL 168 06/15/2020   TRIG 177 (H) 06/15/2020   HDL 45 (L) 06/15/2020   LDLDIRECT 115.0 07/23/2018   LDLCALC 96 06/15/2020   ALT 16 06/15/2020   AST 17 06/15/2020   NA 139 06/15/2020   K 4.5 06/15/2020   CL 99 06/15/2020   CREATININE 0.76 06/15/2020   BUN 24 06/15/2020   CO2 29 06/15/2020   TSH 1.19 06/15/2020   INR 1.0 01/24/2009   HGBA1C 5.8 (H) 06/15/2020    BP Readings from Last 3 Encounters:  07/09/21 110/70  06/15/20 138/86  04/05/20 118/66    Lab plan reviewed with patient  fasting  ASSESSMENT AND PLAN:  Discussed the following assessment and plan:    ICD-10-CM   1. Visit for preventive health examination  A41.66 Basic metabolic panel    CBC with Differential/Platelet    Hemoglobin A1c    Hepatic function panel    Lipid panel    TSH    T4, free    T4,  free    TSH    Lipid panel    Hepatic function panel    Hemoglobin A1c    CBC with Differential/Platelet    Basic metabolic panel    2. Medication management  A63.016 Basic metabolic panel    CBC with Differential/Platelet    Hemoglobin A1c    Hepatic function panel    Lipid panel    TSH    T4, free    T4, free    TSH    Lipid panel    Hepatic function panel    Hemoglobin A1c    CBC with Differential/Platelet    Basic metabolic panel    3. Essential hypertension  W10 Basic metabolic panel    CBC with Differential/Platelet    Hemoglobin A1c    Hepatic function panel    Lipid panel    TSH    T4, free    T4, free    TSH    Lipid panel    Hepatic function panel    Hemoglobin A1c    CBC with Differential/Platelet    Basic metabolic panel    4. Thrombocytosis  X32.355 Basic metabolic panel    CBC with Differential/Platelet    Hemoglobin A1c    Hepatic function panel    Lipid panel    TSH    T4, free    T4, free    TSH    Lipid panel    Hepatic function panel    Hemoglobin A1c    CBC with Differential/Platelet    Basic metabolic panel   his of iron deficiency     5. Hyperlipidemia, unspecified hyperlipidemia type  D32.2 Basic metabolic panel  CBC with Differential/Platelet    Hemoglobin A1c    Hepatic function panel    Lipid panel    TSH    T4, free    T4, free    TSH    Lipid panel    Hepatic function panel    Hemoglobin A1c    CBC with Differential/Platelet    Basic metabolic panel    6. Morbid obesity (HCC)  F75.10 Basic metabolic panel    CBC with Differential/Platelet    Hemoglobin A1c    Hepatic function panel    Lipid panel    TSH    T4, free    T4, free    TSH    Lipid panel    Hepatic function panel    Hemoglobin A1c    CBC with Differential/Platelet    Basic metabolic panel    Has lost weight over the last year have her keep   lifestyle changes counseling tracking If she would like a referral to weight management let us know.   Colon cancer screening due let us know if she wants to do Cologuard instead of a colonoscopy. At this time continue same medications benefit more than risk.  If all okay R OV CPX in a year with lab monitoring earlier if indicated.  Return in about 1 year (around 07/09/2022) for depending on results.  Patient Care Team: Anaiza Behrens, Standley Brooking, MD as PCP - General Marybelle Killings, MD as Attending Physician (Orthopedic Surgery) Patient Instructions  Good to see you today .  Bp is at goal.  Continue lifestyle intervention healthy eating and exercise .   Track intake  and time of day . Sleep.  Let us know if you want to be referred to weight management  for help.    Health Maintenance, Female Adopting a healthy lifestyle and getting preventive care are important in promoting health and wellness. Ask your health care provider about: The right schedule for you to have regular tests and exams. Things you can do on your own to prevent diseases and keep yourself healthy. What should I know about diet, weight, and exercise? Eat a healthy diet  Eat a diet that includes plenty of vegetables, fruits, low-fat dairy products, and lean protein. Do not eat a lot of foods that are high in solid fats, added sugars, or sodium. Maintain a healthy weight Body mass index (BMI) is used to identify weight problems. It estimates body fat based on height and weight. Your health care provider can help determine your BMI and help you achieve or maintain a healthy weight. Get regular exercise Get regular exercise. This is one of the most important things you can do for your health. Most adults should: Exercise for at least 150 minutes each week. The exercise should increase your heart rate and make you sweat (moderate-intensity exercise). Do strengthening exercises at least twice a week. This is in addition to the moderate-intensity exercise. Spend less time sitting. Even light physical activity can be  beneficial. Watch cholesterol and blood lipids Have your blood tested for lipids and cholesterol at 58 years of age, then have this test every 5 years. Have your cholesterol levels checked more often if: Your lipid or cholesterol levels are high. You are older than 58 years of age. You are at high risk for heart disease. What should I know about cancer screening? Depending on your health history and family history, you may need to have cancer screening at various ages. This may include screening  for: Breast cancer. Cervical cancer. Colorectal cancer. Skin cancer. Lung cancer. What should I know about heart disease, diabetes, and high blood pressure? Blood pressure and heart disease High blood pressure causes heart disease and increases the risk of stroke. This is more likely to develop in people who have high blood pressure readings or are overweight. Have your blood pressure checked: Every 3-5 years if you are 7-72 years of age. Every year if you are 84 years old or older. Diabetes Have regular diabetes screenings. This checks your fasting blood sugar level. Have the screening done: Once every three years after age 62 if you are at a normal weight and have a low risk for diabetes. More often and at a younger age if you are overweight or have a high risk for diabetes. What should I know about preventing infection? Hepatitis B If you have a higher risk for hepatitis B, you should be screened for this virus. Talk with your health care provider to find out if you are at risk for hepatitis B infection. Hepatitis C Testing is recommended for: Everyone born from [birth year] through 1963. Anyone with known risk factors for hepatitis C. Sexually transmitted infections (STIs) Get screened for STIs, including gonorrhea and chlamydia, if: You are sexually active and are younger than 58 years of age. You are older than 58 years of age and your health care provider tells you that you are at risk for  this type of infection. Your sexual activity has changed since you were last screened, and you are at increased risk for chlamydia or gonorrhea. Ask your health care provider if you are at risk. Ask your health care provider about whether you are at high risk for HIV. Your health care provider may recommend a prescription medicine to help prevent HIV infection. If you choose to take medicine to prevent HIV, you should first get tested for HIV. You should then be tested every 3 months for as long as you are taking the medicine. Pregnancy If you are about to stop having your period (premenopausal) and you may become pregnant, seek counseling before you get pregnant. Take 400 to 800 micrograms (mcg) of folic acid every day if you become pregnant. Ask for birth control (contraception) if you want to prevent pregnancy. Osteoporosis and menopause Osteoporosis is a disease in which the bones lose minerals and strength with aging. This can result in bone fractures. If you are 82 years old or older, or if you are at risk for osteoporosis and fractures, ask your health care provider if you should: Be screened for bone loss. Take a calcium or vitamin D supplement to lower your risk of fractures. Be given hormone replacement therapy (HRT) to treat symptoms of menopause. Follow these instructions at home: Alcohol use Do not drink alcohol if: Your health care provider tells you not to drink. You are pregnant, may be pregnant, or are planning to become pregnant. If you drink alcohol: Limit how much you have to: 0-1 drink a day. Know how much alcohol is in your drink. In the [place] one drink equals one 12 oz bottle of beer (355 mL), one 5 oz glass of wine (148 mL), or one 1 oz glass of hard liquor (44 mL). Lifestyle Do not use any products that contain nicotine or tobacco. These products include cigarettes, chewing tobacco, and vaping devices, such as e-cigarettes. If you need help quitting, ask your health  care provider. Do not use street drugs. Do not share needles. Ask your  health care provider for help if you need support or information about quitting drugs. General instructions Schedule regular health, dental, and eye exams. Stay current with your vaccines. Tell your health care provider if: You often feel depressed. You have ever been abused or do not feel safe at home. Summary Adopting a healthy lifestyle and getting preventive care are important in promoting health and wellness. Follow your health care provider's instructions about healthy diet, exercising, and getting tested or screened for diseases. Follow your health care provider's instructions on monitoring your cholesterol and blood pressure. This information is not intended to replace advice given to you by your health care provider. Make sure you discuss any questions you have with your health care provider. Document Revised: 12/24/2020 Document Reviewed: 12/24/2020 Elsevier Patient Education  2022 [place] Tashauna Caisse M.D.

## 2021-07-09 ENCOUNTER — Ambulatory Visit (INDEPENDENT_AMBULATORY_CARE_PROVIDER_SITE_OTHER): Payer: 59 | Admitting: Internal Medicine

## 2021-07-09 ENCOUNTER — Encounter: Payer: Self-pay | Admitting: Internal Medicine

## 2021-07-09 VITALS — BP 110/70 | HR 78 | Temp 98.5°F | Ht 64.0 in | Wt 251.2 lb

## 2021-07-09 DIAGNOSIS — Z0000 Encounter for general adult medical examination without abnormal findings: Secondary | ICD-10-CM | POA: Diagnosis not present

## 2021-07-09 DIAGNOSIS — I10 Essential (primary) hypertension: Secondary | ICD-10-CM

## 2021-07-09 DIAGNOSIS — Z79899 Other long term (current) drug therapy: Secondary | ICD-10-CM

## 2021-07-09 DIAGNOSIS — E785 Hyperlipidemia, unspecified: Secondary | ICD-10-CM

## 2021-07-09 DIAGNOSIS — G479 Sleep disorder, unspecified: Secondary | ICD-10-CM

## 2021-07-09 DIAGNOSIS — D75839 Thrombocytosis, unspecified: Secondary | ICD-10-CM | POA: Diagnosis not present

## 2021-07-09 LAB — CBC WITH DIFFERENTIAL/PLATELET
Basophils Absolute: 0.1 10*3/uL (ref 0.0–0.1)
Basophils Relative: 1.1 % (ref 0.0–3.0)
Eosinophils Absolute: 0.3 10*3/uL (ref 0.0–0.7)
Eosinophils Relative: 3.3 % (ref 0.0–5.0)
HCT: 42.1 % (ref 36.0–46.0)
Hemoglobin: 13.9 g/dL (ref 12.0–15.0)
Lymphocytes Relative: 18.9 % (ref 12.0–46.0)
Lymphs Abs: 1.5 10*3/uL (ref 0.7–4.0)
MCHC: 32.9 g/dL (ref 30.0–36.0)
MCV: 82.1 fl (ref 78.0–100.0)
Monocytes Absolute: 0.4 10*3/uL (ref 0.1–1.0)
Monocytes Relative: 4.9 % (ref 3.0–12.0)
Neutro Abs: 5.6 10*3/uL (ref 1.4–7.7)
Neutrophils Relative %: 71.8 % (ref 43.0–77.0)
Platelets: 474 10*3/uL — ABNORMAL HIGH (ref 150.0–400.0)
RBC: 5.13 Mil/uL — ABNORMAL HIGH (ref 3.87–5.11)
RDW: 15.1 % (ref 11.5–15.5)
WBC: 7.8 10*3/uL (ref 4.0–10.5)

## 2021-07-09 LAB — LIPID PANEL
Cholesterol: 218 mg/dL — ABNORMAL HIGH (ref 0–200)
HDL: 45.9 mg/dL (ref >39.00)
LDL Cholesterol: 135 mg/dL — ABNORMAL HIGH (ref 0–99)
NonHDL: 172.04
Total CHOL/HDL Ratio: 5
Triglycerides: 186 mg/dL — ABNORMAL HIGH (ref 0.0–149.0)
VLDL: 37.2 mg/dL (ref 0.0–40.0)

## 2021-07-09 LAB — HEPATIC FUNCTION PANEL
ALT: 22 U/L (ref 0–35)
AST: 26 U/L (ref 0–37)
Albumin: 4.5 g/dL (ref 3.5–5.2)
Alkaline Phosphatase: 51 U/L (ref 39–117)
Bilirubin, Direct: 0.1 mg/dL (ref 0.0–0.3)
Total Bilirubin: 0.6 mg/dL (ref 0.2–1.2)
Total Protein: 8.3 g/dL (ref 6.0–8.3)

## 2021-07-09 LAB — BASIC METABOLIC PANEL
BUN: 25 mg/dL — ABNORMAL HIGH (ref 6–23)
CO2: 31 mEq/L (ref 19–32)
Calcium: 9.5 mg/dL (ref 8.4–10.5)
Chloride: 97 mEq/L (ref 96–112)
Creatinine, Ser: 0.75 mg/dL (ref 0.40–1.20)
GFR: 87.79 mL/min (ref >60.00)
Glucose, Bld: 89 mg/dL (ref 70–99)
Potassium: 4.1 mEq/L (ref 3.5–5.1)
Sodium: 137 mEq/L (ref 135–145)

## 2021-07-09 LAB — T4, FREE: Free T4: 0.74 ng/dL (ref 0.60–1.60)

## 2021-07-09 LAB — HEMOGLOBIN A1C: Hgb A1c MFr Bld: 5.9 % (ref 4.6–6.5)

## 2021-07-09 LAB — TSH: TSH: 1.43 u[IU]/mL (ref 0.35–5.50)

## 2021-07-09 NOTE — Patient Instructions (Addendum)
Good to see you today .  Bp is at goal.  Continue lifestyle intervention healthy eating and exercise .   Track intake  and time of day . Sleep.  Let us know if you want to be referred to weight management  for help.    Health Maintenance, Female Adopting a healthy lifestyle and getting preventive care are important in promoting health and wellness. Ask your health care provider about: The right schedule for you to have regular tests and exams. Things you can do on your own to prevent diseases and keep yourself healthy. What should I know about diet, weight, and exercise? Eat a healthy diet  Eat a diet that includes plenty of vegetables, fruits, low-fat dairy products, and lean protein. Do not eat a lot of foods that are high in solid fats, added sugars, or sodium. Maintain a healthy weight Body mass index (BMI) is used to identify weight problems. It estimates body fat based on height and weight. Your health care provider can help determine your BMI and help you achieve or maintain a healthy weight. Get regular exercise Get regular exercise. This is one of the most important things you can do for your health. Most adults should: Exercise for at least 150 minutes each week. The exercise should increase your heart rate and make you sweat (moderate-intensity exercise). Do strengthening exercises at least twice a week. This is in addition to the moderate-intensity exercise. Spend less time sitting. Even light physical activity can be beneficial. Watch cholesterol and blood lipids Have your blood tested for lipids and cholesterol at 57 years of age, then have this test every 5 years. Have your cholesterol levels checked more often if: Your lipid or cholesterol levels are high. You are older than 58 years of age. You are at high risk for heart disease. What should I know about cancer screening? Depending on your health history and family history, you may need to have cancer screening at  various ages. This may include screening for: Breast cancer. Cervical cancer. Colorectal cancer. Skin cancer. Lung cancer. What should I know about heart disease, diabetes, and high blood pressure? Blood pressure and heart disease High blood pressure causes heart disease and increases the risk of stroke. This is more likely to develop in people who have high blood pressure readings or are overweight. Have your blood pressure checked: Every 3-5 years if you are 47-48 years of age. Every year if you are 52 years old or older. Diabetes Have regular diabetes screenings. This checks your fasting blood sugar level. Have the screening done: Once every three years after age 73 if you are at a normal weight and have a low risk for diabetes. More often and at a younger age if you are overweight or have a high risk for diabetes. What should I know about preventing infection? Hepatitis B If you have a higher risk for hepatitis B, you should be screened for this virus. Talk with your health care provider to find out if you are at risk for hepatitis B infection. Hepatitis C Testing is recommended for: Everyone born from [birth year] through 1963. Anyone with known risk factors for hepatitis C. Sexually transmitted infections (STIs) Get screened for STIs, including gonorrhea and chlamydia, if: You are sexually active and are younger than 58 years of age. You are older than 58 years of age and your health care provider tells you that you are at risk for this type of infection. Your sexual activity has changed since you  were last screened, and you are at increased risk for chlamydia or gonorrhea. Ask your health care provider if you are at risk. Ask your health care provider about whether you are at high risk for HIV. Your health care provider may recommend a prescription medicine to help prevent HIV infection. If you choose to take medicine to prevent HIV, you should first get tested for HIV. You should then be  tested every 3 months for as long as you are taking the medicine. Pregnancy If you are about to stop having your period (premenopausal) and you may become pregnant, seek counseling before you get pregnant. Take 400 to 800 micrograms (mcg) of folic acid every day if you become pregnant. Ask for birth control (contraception) if you want to prevent pregnancy. Osteoporosis and menopause Osteoporosis is a disease in which the bones lose minerals and strength with aging. This can result in bone fractures. If you are 37 years old or older, or if you are at risk for osteoporosis and fractures, ask your health care provider if you should: Be screened for bone loss. Take a calcium or vitamin D supplement to lower your risk of fractures. Be given hormone replacement therapy (HRT) to treat symptoms of menopause. Follow these instructions at home: Alcohol use Do not drink alcohol if: Your health care provider tells you not to drink. You are pregnant, may be pregnant, or are planning to become pregnant. If you drink alcohol: Limit how much you have to: 0-1 drink a day. Know how much alcohol is in your drink. In the [place] one drink equals one 12 oz bottle of beer (355 mL), one 5 oz glass of wine (148 mL), or one 1 oz glass of hard liquor (44 mL). Lifestyle Do not use any products that contain nicotine or tobacco. These products include cigarettes, chewing tobacco, and vaping devices, such as e-cigarettes. If you need help quitting, ask your health care provider. Do not use street drugs. Do not share needles. Ask your health care provider for help if you need support or information about quitting drugs. General instructions Schedule regular health, dental, and eye exams. Stay current with your vaccines. Tell your health care provider if: You often feel depressed. You have ever been abused or do not feel safe at home. Summary Adopting a healthy lifestyle and getting preventive care are important in  promoting health and wellness. Follow your health care provider's instructions about healthy diet, exercising, and getting tested or screened for diseases. Follow your health care provider's instructions on monitoring your cholesterol and blood pressure. This information is not intended to replace advice given to you by your health care provider. Make sure you discuss any questions you have with your health care provider. Document Revised: 12/24/2020 Document Reviewed: 12/24/2020 Elsevier Patient Education  [place].

## 2021-07-18 NOTE — Progress Notes (Signed)
Blood sugar A1c is stable not in the diabetic range Cholesterol a bit higher than last visit Liver chemistry thyroid normal  platelet level runs borderline high stable but should be followed.  No anemia  We can do a referral to dietitian if she is interested in regard to her cholesterol however she can also get online from [facility] to look at diet and exercise interventions that would be helpful.

## 2021-08-12 ENCOUNTER — Other Ambulatory Visit: Payer: Self-pay | Admitting: Internal Medicine

## 2022-03-05 IMAGING — MG MM DIGITAL SCREENING BILAT W/ TOMO AND CAD
8 series · 8 of 24 positions shown · non-contrast
Comparison: Previous exam(s).

CLINICAL DATA: Screening.

EXAM:
DIGITAL SCREENING BILATERAL MAMMOGRAM WITH TOMOSYNTHESIS AND CAD
TECHNIQUE: Bilateral screening digital craniocaudal and mediolateral oblique
mammograms were obtained. Bilateral screening digital breast
tomosynthesis was performed. The images were evaluated with
computer-aided detection.

[L CC synth-2D]
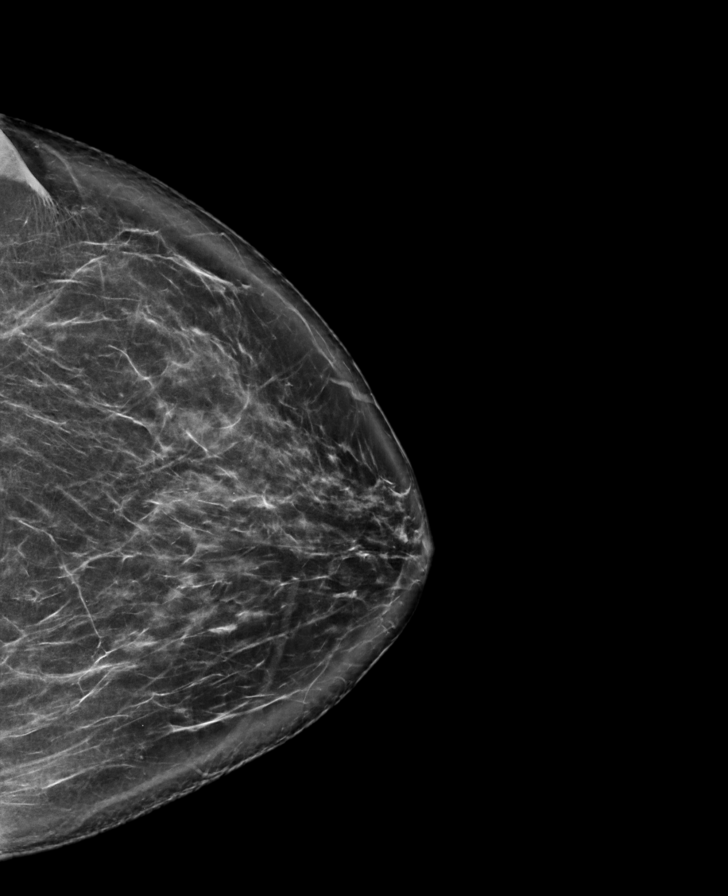

[R MLO synth-2D]
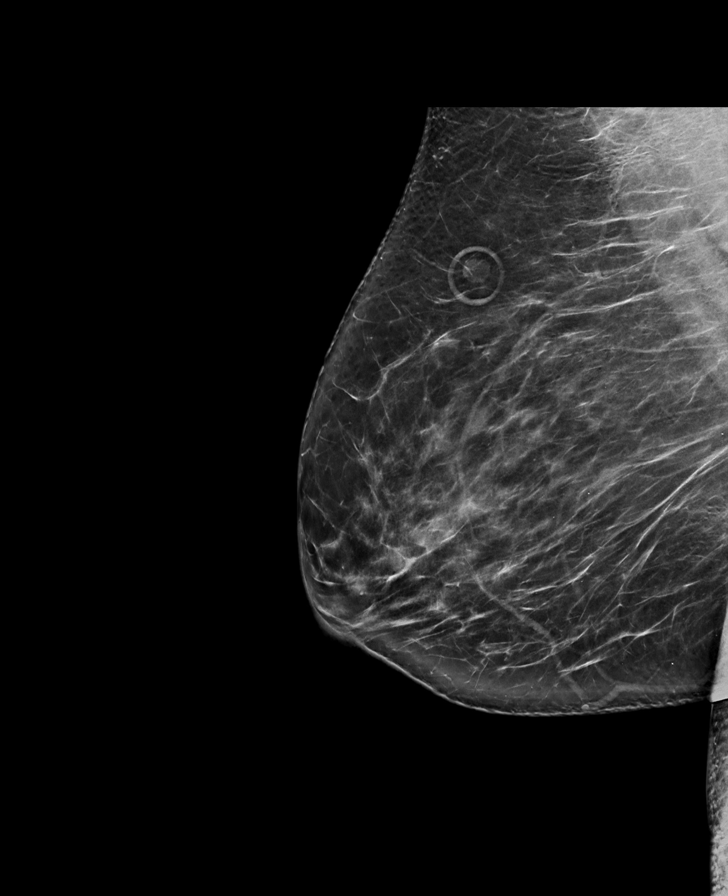

[R CC synth-2D]
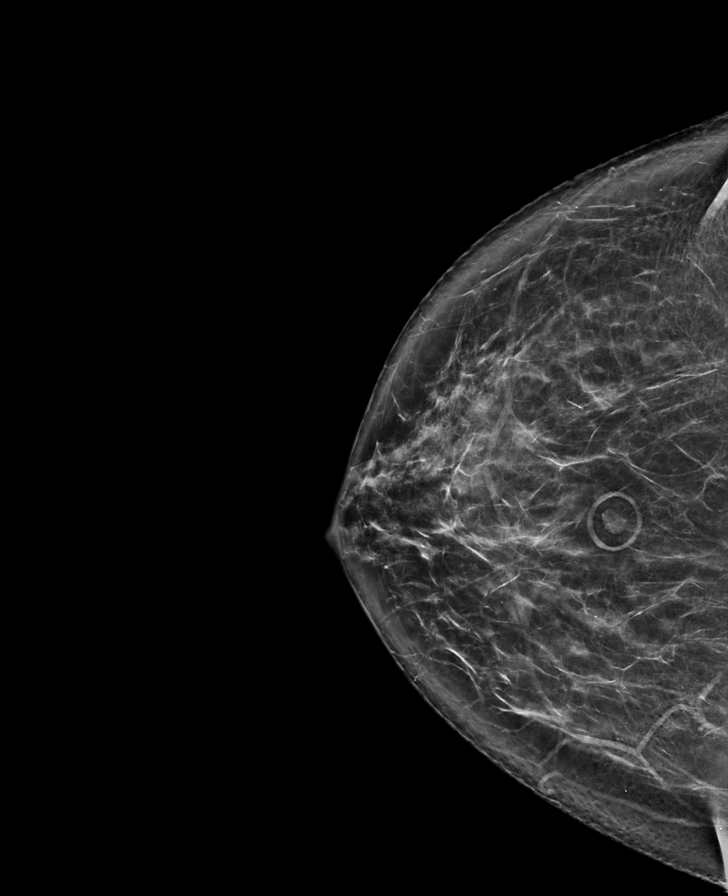

[L MLO synth-2D]
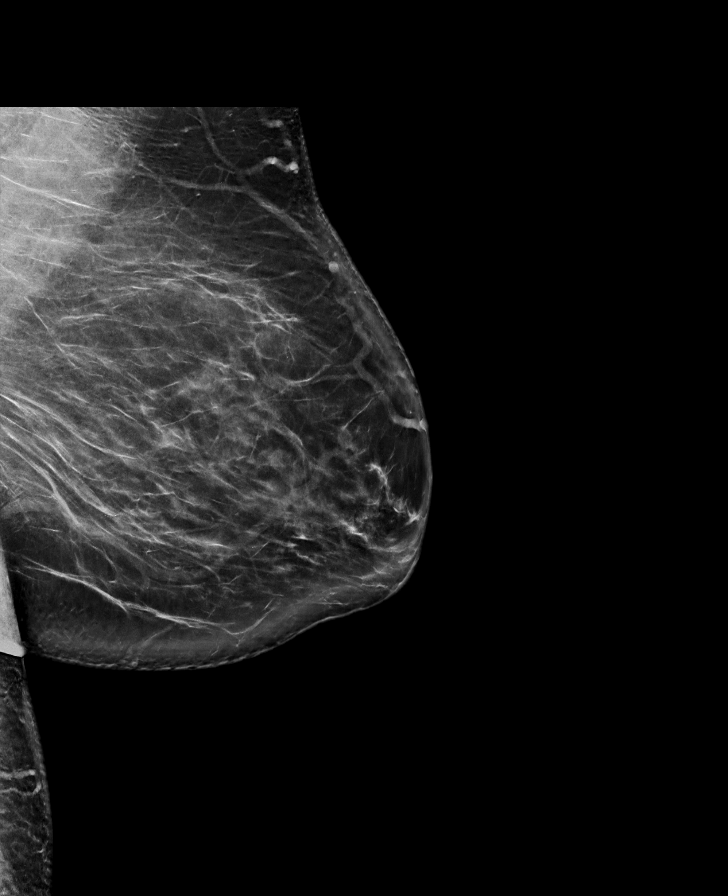

[R MLO tomo · tomo slice 45/88.0]
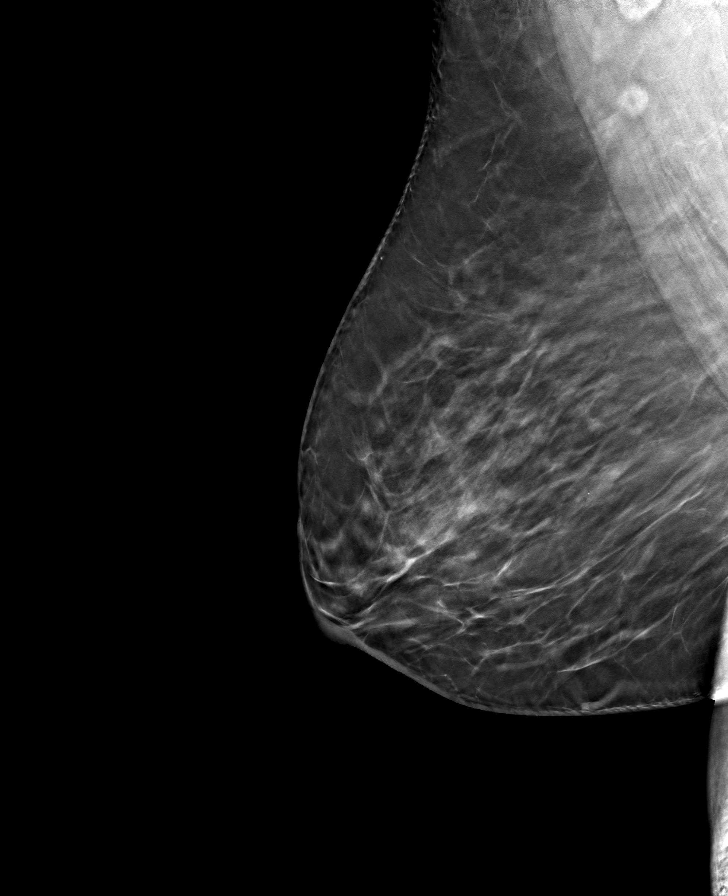

[L CC tomo · tomo slice 45/89.0]
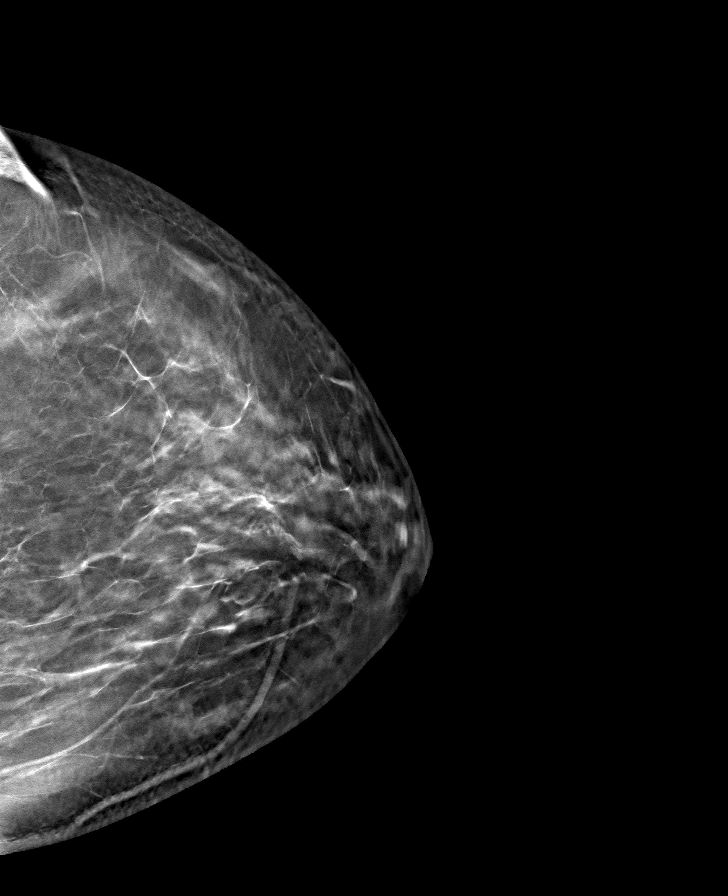

[R CC tomo · tomo slice 42/83.0]
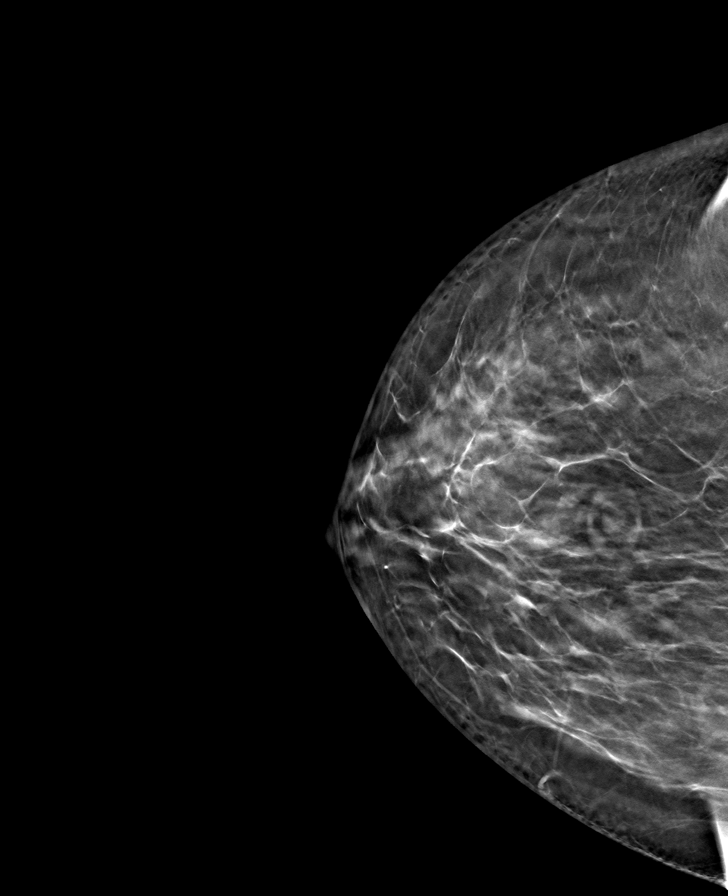

[L MLO tomo · tomo slice 47/94.0]
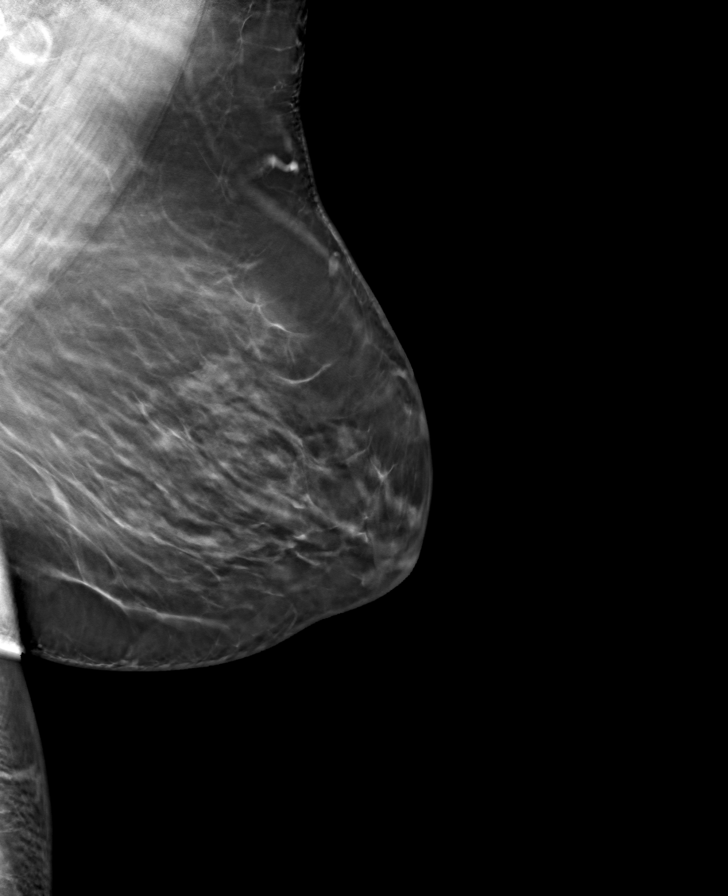

[8 of 24 positions shown; findings below may reference images not displayed]

ACR Breast Density Category c: The breast tissue is heterogeneously
dense, which may obscure small masses.
FINDINGS: There are no findings suspicious for malignancy.
IMPRESSION: No mammographic evidence of malignancy. A result letter of this
screening mammogram will be mailed directly to the patient.

RECOMMENDATION:
Screening mammogram in one year. (Code:[id number])

BI-RADS CATEGORY  1: Negative.

## 2022-03-11 ENCOUNTER — Ambulatory Visit (INDEPENDENT_AMBULATORY_CARE_PROVIDER_SITE_OTHER): Payer: 59 | Admitting: Internal Medicine

## 2022-03-11 ENCOUNTER — Encounter: Payer: Self-pay | Admitting: Internal Medicine

## 2022-03-11 VITALS — BP 124/82 | HR 76 | Temp 98.2°F | Ht 64.0 in | Wt 250.6 lb

## 2022-03-11 DIAGNOSIS — M79641 Pain in right hand: Secondary | ICD-10-CM

## 2022-03-11 DIAGNOSIS — M79642 Pain in left hand: Secondary | ICD-10-CM

## 2022-03-11 DIAGNOSIS — Z981 Arthrodesis status: Secondary | ICD-10-CM

## 2022-03-11 DIAGNOSIS — M25511 Pain in right shoulder: Secondary | ICD-10-CM

## 2022-03-11 DIAGNOSIS — R399 Unspecified symptoms and signs involving the genitourinary system: Secondary | ICD-10-CM | POA: Diagnosis not present

## 2022-03-11 DIAGNOSIS — M25512 Pain in left shoulder: Secondary | ICD-10-CM

## 2022-03-11 DIAGNOSIS — G8929 Other chronic pain: Secondary | ICD-10-CM

## 2022-03-11 LAB — COMPREHENSIVE METABOLIC PANEL
ALT: 17 U/L (ref 0–35)
AST: 20 U/L (ref 0–37)
Albumin: 4.3 g/dL (ref 3.5–5.2)
Alkaline Phosphatase: 56 U/L (ref 39–117)
BUN: 26 mg/dL — ABNORMAL HIGH (ref 6–23)
CO2: 32 mEq/L (ref 19–32)
Calcium: 9.4 mg/dL (ref 8.4–10.5)
Chloride: 99 mEq/L (ref 96–112)
Creatinine, Ser: 0.69 mg/dL (ref 0.40–1.20)
GFR: 95.24 mL/min (ref >60.00)
Glucose, Bld: 101 mg/dL — ABNORMAL HIGH (ref 70–99)
Potassium: 4.2 mEq/L (ref 3.5–5.1)
Sodium: 140 mEq/L (ref 135–145)
Total Bilirubin: 0.8 mg/dL (ref 0.2–1.2)
Total Protein: 7.9 g/dL (ref 6.0–8.3)

## 2022-03-11 LAB — POCT URINALYSIS DIPSTICK
Bilirubin, UA: NEGATIVE
Blood, UA: NEGATIVE
Glucose, UA: NEGATIVE
Ketones, UA: NEGATIVE
Leukocytes, UA: NEGATIVE
Nitrite, UA: NEGATIVE
Spec Grav, UA: 1.025 (ref 1.010–1.025)
Urobilinogen, UA: NEGATIVE E.U./dL — AB
pH, UA: 6 (ref 5.0–8.0)

## 2022-03-11 LAB — CBC WITH DIFFERENTIAL/PLATELET
Basophils Absolute: 0.1 10*3/uL (ref 0.0–0.1)
Basophils Relative: 0.9 % (ref 0.0–3.0)
Eosinophils Absolute: 0.4 10*3/uL (ref 0.0–0.7)
Eosinophils Relative: 3.9 % (ref 0.0–5.0)
HCT: 39.2 % (ref 36.0–46.0)
Hemoglobin: 13.1 g/dL (ref 12.0–15.0)
Lymphocytes Relative: 16.3 % (ref 12.0–46.0)
Lymphs Abs: 1.6 10*3/uL (ref 0.7–4.0)
MCHC: 33.3 g/dL (ref 30.0–36.0)
MCV: 84.6 fl (ref 78.0–100.0)
Monocytes Absolute: 0.5 10*3/uL (ref 0.1–1.0)
Monocytes Relative: 5.4 % (ref 3.0–12.0)
Neutro Abs: 7.3 10*3/uL (ref 1.4–7.7)
Neutrophils Relative %: 73.5 % (ref 43.0–77.0)
Platelets: 417 10*3/uL — ABNORMAL HIGH (ref 150.0–400.0)
RBC: 4.63 Mil/uL (ref 3.87–5.11)
RDW: 14.1 % (ref 11.5–15.5)
WBC: 9.9 10*3/uL (ref 4.0–10.5)

## 2022-03-11 LAB — C-REACTIVE PROTEIN: CRP: 2.6 mg/dL (ref 0.5–20.0)

## 2022-03-11 LAB — TSH: TSH: 1.12 u[IU]/mL (ref 0.35–5.50)

## 2022-03-11 LAB — SEDIMENTATION RATE: Sed Rate: 70 mm/hr — ABNORMAL HIGH (ref 0–30)

## 2022-03-11 MED ORDER — MELOXICAM 15 MG PO TABS: 15.0000 mg | ORAL_TABLET | Freq: Every day | ORAL | 0 refills | Status: DC

## 2022-03-11 NOTE — Patient Instructions (Addendum)
Lab today checking for inflammatory markers Plan ortho referral  hand  specialists   Trial for meloxicam daily for  1-2 weeks.  Some times we try prednisone but  not first line.   Could be OA and other   uncertain if related to c spine  condition.

## 2022-03-11 NOTE — Progress Notes (Signed)
Chief Complaint  Patient presents with   Hand Pain    And wrist shoulders and pain in lower abdomen started yesterday right after eating sharp pains      HPI: Danielle Martin 61 y.o. come in for newer concerns .  Began with shoulder hurting  ,dec rom elbows and fingers  and keeps her up at night   hands wake her up at night.  Base of thumbs  progressed over past 3 months to effect funstion  Been dealing for all years   but somehtin has progressed   Described as an ache  fingers number if drive for more than 30 minutes  .  More in thumb area.     Can no longer  do some exercises cause of pain .  Hard to pinpoint  .   Yesterday and lower abdomin   like uti.   Family hx   aunt and maternal gm RA .   No hx of injury.  No time of day. Rx aleve tylenol.   No help  ROS: See pertinent positives and negatives per HPI. 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   Past Medical History:  Diagnosis Date   Allergic rhinitis    Worse in winter , grasses   Asthma    Worse in winter   Gall stone pancreatitis    lap choley 1 2017   GERD (gastroesophageal reflux disease)    Headache(784.0)    "bi-weekly" (09/03/2015)   HEMORRHAGIC CYSTITIS 05/25/2009   Qualifier: Diagnosis of  By: Regis Bill MD, Standley Brooking    Hypertension    Iron deficiency anemia 05/01/2015   Migraine    "q couple months" (09/03/2015)   Pneumonia    "a few times" (09/03/2015)   SVT (supraventricular tachycardia) (HCC)    No longer a problem    Family History  Problem Relation Age of Onset   Other Mother        Died of asphyxiation   Prostate cancer Father     Social History   Socioeconomic History   Marital status: Married    Spouse name: Not on file   Number of children: Not on file   Years of education: Not on file   Highest education level: Associate degree: occupational, Hotel manager, or vocational program  Occupational History   Not on file  Tobacco Use   Smoking status: Never   Smokeless tobacco:  Never   Tobacco comments:    "stopped smoking in the 1980s"  Vaping Use   Vaping Use: Never used  Substance and Sexual Activity   Alcohol use: Yes    Alcohol/week: 8.0 standard drinks of alcohol    Types: 4 Glasses of wine, 4 Shots of liquor per week   Drug use: No   Sexual activity: Yes  Other Topics Concern   Not on file  Social History Narrative   Married   Employed [employer]  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    HH of 2    Husband has a Midwife      No ets   Drinks milk and women's MV   Soc etoh only rare no tob ets   Social Determinants of Health   Financial Resource Strain: Low Risk  (03/08/2022)   Overall Financial Resource Strain (CARDIA)    Difficulty of Paying Living Expenses: Not hard at all  Food Insecurity: No Food Insecurity (03/08/2022)   Hunger Vital  Sign    Worried About Charity fundraiser in the Last Year: Never true    [place] in the Last Year: Never true  Transportation Needs: No Transportation Needs (03/08/2022)   PRAPARE - Hydrologist (Medical): No    Lack of Transportation (Non-Medical): No  Physical Activity: Insufficiently Active (03/08/2022)   Exercise Vital Sign    Days of Exercise per Week: 2 days    Minutes of Exercise per Session: 30 min  Stress: Stress Concern Present (03/08/2022)   [place]    Feeling of Stress : To some extent  Social Connections: Moderately Isolated (03/08/2022)   Social Connection and Isolation Panel [NHANES]    Frequency of Communication with Friends and Family: More than three times a week    Frequency of Social Gatherings with Friends and Family: Three times a week    Attends Religious Services: Never    Active Member of Clubs or Organizations: No    Attends Music therapist: Not on file    Marital Status: Married    Outpatient Medications Prior to Visit  Medication  Sig Dispense Refill   albuterol (VENTOLIN HFA) 108 (90 Base) MCG/ACT inhaler Inhale 2 puffs into the lungs every 6 (six) hours as needed for wheezing. 18 g 1   Calcium Carbonate-Vitamin D 600-400 MG-UNIT per chew tablet Chew 1 tablet by mouth daily.     CINNAMON PO Take 1,000 Units by mouth every morning.     escitalopram (LEXAPRO) 10 MG tablet TAKE 1 TABLET BY MOUTH EVERY DAY 90 tablet 0   LORazepam (ATIVAN) 0.5 MG tablet 1 po if needed,  Up to  Every  8 hours  For anxiety 20 tablet 0   losartan-hydrochlorothiazide (HYZAAR) 50-12.5 MG tablet TAKE 1 TABLET DAILY 90 tablet 3   metoprolol succinate (TOPROL-XL) 50 MG 24 hr tablet TAKE 1 TABLET DAILY WITH ORIMMEDIATELY FOLLOWING A    MEAL 90 tablet 3   Multiple Vitamin (MULTI-VITAMIN PO) Take 1 tablet by mouth daily.      vitamin E 400 UNIT capsule Take 400 Units by mouth daily.     albuterol (PROVENTIL) (2.5 MG/3ML) 0.083% nebulizer solution Take 3 mLs (2.5 mg total) by nebulization every 6 (six) hours as needed for wheezing or shortness of breath. 150 mL 1   No facility-administered medications prior to visit.     EXAM:  BP 124/82 (BP Location: Left Arm, Patient Position: Sitting, Cuff Size: Normal)   Pulse 76   Temp 98.2 F (36.8 C) (Oral)   Ht '5\' 4"'$  (1.626 m)   Wt 250 lb 9.6 oz (113.7 kg)   SpO2 98%   BMI 43.02 kg/m   Body mass index is 43.02 kg/m.  GENERAL: vitals reviewed and listed above, alert, oriented, appears well hydrated and in no acute distress HEENT: atraumatic, conjunctiva  clear, no obvious abnormalities on inspection of external nose and ears NECK: no obvious masses on inspection palpation  LUNGS: clear to auscultation bilaterally, no wheezes, rales or rhonchi, good air movement CV: HRRR, no clubbing cyanosis or  peripheral edema nl cap refill  Abdomen:  Sof,t normal bowel sounds without hepatosplenomegaly, no guarding rebound or masses no CVA tenderness MS: moves all extremities without noticeable focal   abnormality  hands no acuta findings  cmc thumb some tenderness  grip =  tender over both deltood areas shoulders    No unusual rashes  PSYCH:  pleasant and cooperative, no obvious depression or anxiety Neuro non focal  Lab Results  Component Value Date   WBC 9.9 03/11/2022   HGB 13.1 03/11/2022   HCT 39.2 03/11/2022   PLT 417.0 (H) 03/11/2022   GLUCOSE 101 (H) 03/11/2022   CHOL 218 (H) 07/09/2021   TRIG 186.0 (H) 07/09/2021   HDL 45.90 07/09/2021   LDLDIRECT 115.0 07/23/2018   LDLCALC 135 (H) 07/09/2021   ALT 17 03/11/2022   AST 20 03/11/2022   NA 140 03/11/2022   K 4.2 03/11/2022   CL 99 03/11/2022   CREATININE 0.69 03/11/2022   BUN 26 (H) 03/11/2022   CO2 32 03/11/2022   TSH 1.12 03/11/2022   INR 1.0 01/24/2009   HGBA1C 5.9 07/09/2021   BP Readings from Last 3 Encounters:  03/11/22 124/82  07/09/21 110/70  06/15/20 138/86    ASSESSMENT AND PLAN:  Discussed the following assessment and plan:  Bilateral hand pain - Plan: CBC with Differential/Platelet, Sedimentation rate, C-reactive protein, Cyclic citrul peptide antibody, IgG, Rheumatoid Factor, ANA, Comprehensive metabolic panel, TSH, TSH, Comprehensive metabolic panel, ANA, Rheumatoid Factor, Cyclic citrul peptide antibody, IgG, C-reactive protein, Sedimentation rate, CBC with Differential/Platelet  Chronic pain of both shoulders - Plan: CBC with Differential/Platelet, Sedimentation rate, C-reactive protein, Cyclic citrul peptide antibody, IgG, Rheumatoid Factor, ANA, Comprehensive metabolic panel, TSH, TSH, Comprehensive metabolic panel, ANA, Rheumatoid Factor, Cyclic citrul peptide antibody, IgG, C-reactive protein, Sedimentation rate, CBC with Differential/Platelet  Lower urinary tract symptoms (LUTS) - Plan: CBC with Differential/Platelet, Sedimentation rate, C-reactive protein, Cyclic citrul peptide antibody, IgG, Rheumatoid Factor, ANA, Comprehensive metabolic panel, TSH, POC Urinalysis Dipstick, Urine Culture, TSH,  Comprehensive metabolic panel, ANA, Rheumatoid Factor, Cyclic citrul peptide antibody, IgG, C-reactive protein, Sedimentation rate, CBC with Differential/Platelet  History of fusion of cervical spine - Plan: CBC with Differential/Platelet, Sedimentation rate, C-reactive protein, Cyclic citrul peptide antibody, IgG, Rheumatoid Factor, ANA, Comprehensive metabolic panel, TSH, TSH, Comprehensive metabolic panel, ANA, Rheumatoid Factor, Cyclic citrul peptide antibody, IgG, C-reactive protein, Sedimentation rate, CBC with Differential/Platelet Less likely pmr    consider inflammatory ,  djd with hx of  cervical surgery.   But not typical of either. Laboratory testing today referral most likely to Ortho hand upper extremity specialist and go from there. Checking for UTI treat as appropriate. -Patient advised to return or notify health care team  if  new concerns arise.  Patient Instructions  Lab today checking for inflammatory markers Plan ortho referral  hand  specialists   Trial for meloxicam daily for  1-2 weeks.  Some times we try prednisone but  not first line.   Could be OA and other   uncertain if related to c spine  condition.   Standley Brooking. Crew Goren M.D.

## 2022-03-13 LAB — URINE CULTURE
MICRO NUMBER:: 13691270
SPECIMEN QUALITY:: ADEQUATE

## 2022-03-13 LAB — ANA: Anti Nuclear Antibody (ANA): NEGATIVE

## 2022-03-13 LAB — RHEUMATOID FACTOR: Rheumatoid fact SerPl-aCnc: <14 IU/mL (ref <14)

## 2022-03-13 LAB — CYCLIC CITRUL PEPTIDE ANTIBODY, IGG: Cyclic Citrullin Peptide Ab: <16 UNITS

## 2022-03-16 ENCOUNTER — Other Ambulatory Visit: Payer: Self-pay | Admitting: Internal Medicine

## 2022-03-16 MED ORDER — AMOXICILLIN 500 MG PO CAPS: 500.0000 mg | ORAL_CAPSULE | Freq: Three times a day (TID) | ORAL | 0 refills | Status: AC

## 2022-03-16 NOTE — Progress Notes (Signed)
Uti rx

## 2022-03-16 NOTE — Progress Notes (Signed)
Urine shows enterococcus  uti  can be treated with amoxiciliin . I will send to  your [facility]  Rest of labs  one elevated inflammation marker  ( esr) could be from uti  but rest is normal  or insignificant findings .  Let hs know if  urine sx do not resolve   (I have placed an order for hand specialist  dr Amedeo Plenty  and [facility]  they shoul dcontact you about appt.)

## 2022-04-08 ENCOUNTER — Other Ambulatory Visit: Payer: Self-pay | Admitting: Internal Medicine

## 2022-04-09 ENCOUNTER — Telehealth: Payer: Self-pay | Admitting: Internal Medicine

## 2022-04-09 NOTE — Telephone Encounter (Addendum)
Nell with [facility] [phone] Asked for a call back - She has questions regarding C-Pap.  Also, was her faxed request received? What is the status?

## 2022-04-10 NOTE — Telephone Encounter (Signed)
Attempt to reach twice. Left number to call us back.

## 2022-04-11 ENCOUNTER — Telehealth: Payer: Self-pay | Admitting: Pulmonary Disease

## 2022-04-17 NOTE — Telephone Encounter (Signed)
Pt has not been seen in 2 years. Pt will need an appt prior to [place] able to do anything with BIPAP settings.  Pt's current appt can always be moved up sooner with either Dr. Jenetta Downer or APP.  Attempted to call pt but unable to reach. Left message for her to return call.

## 2022-04-22 NOTE — Telephone Encounter (Signed)
Tried to reach [facility]. Unable to reach through. It stated the waiting is less than 30 seconds. Been on hold for at least 5 mins.

## 2022-04-23 ENCOUNTER — Encounter: Payer: Self-pay | Admitting: Pulmonary Disease

## 2022-04-23 ENCOUNTER — Ambulatory Visit (INDEPENDENT_AMBULATORY_CARE_PROVIDER_SITE_OTHER): Payer: 59 | Admitting: Pulmonary Disease

## 2022-04-23 VITALS — BP 122/80 | HR 78 | Temp 97.9°F | Ht 64.0 in | Wt 250.4 lb

## 2022-04-23 DIAGNOSIS — G4733 Obstructive sleep apnea (adult) (pediatric): Secondary | ICD-10-CM | POA: Diagnosis not present

## 2022-04-23 NOTE — Progress Notes (Signed)
Danielle Martin    [id number]    [birth year]  Primary Care Physician:Panosh, Standley Brooking, MD  Referring Physician: Burnis Medin, MD [place],  [place] [address]  Chief complaint:   In for follow-up of obstructive sleep apnea  HPI:  Patient with obstructive sleep apnea who has been on BiPAP therapy BiPAP was set up at 21/17  Patient has not been able to tolerate BiPAP well  Has had several mask changes feels the pressure may be too high  Usually goes to bed about 10:30 PM Wake up time about 6 AM Denies any significant dryness, no headaches in the morning She does feel sleepy during the day Rarely will take a nap during the day  Memory is good  Pets: No pets   Outpatient Encounter Medications as of 04/23/2022  Medication Sig   albuterol (VENTOLIN HFA) 108 (90 Base) MCG/ACT inhaler Inhale 2 puffs into the lungs every 6 (six) hours as needed for wheezing.   Calcium Carbonate-Vitamin D 600-400 MG-UNIT per chew tablet Chew 1 tablet by mouth daily.   CINNAMON PO Take 1,000 Units by mouth every morning.   escitalopram (LEXAPRO) 10 MG tablet TAKE 1 TABLET BY MOUTH EVERY DAY   LORazepam (ATIVAN) 0.5 MG tablet 1 po if needed,  Up to  Every  8 hours  For anxiety   losartan-hydrochlorothiazide (HYZAAR) 50-12.5 MG tablet TAKE 1 TABLET DAILY   metoprolol succinate (TOPROL-XL) 50 MG 24 hr tablet TAKE 1 TABLET DAILY WITH ORIMMEDIATELY FOLLOWING A    MEAL   Multiple Vitamin (MULTI-VITAMIN PO) Take 1 tablet by mouth daily.    vitamin E 400 UNIT capsule Take 400 Units by mouth daily.   [DISCONTINUED] meloxicam (MOBIC) 15 MG tablet Take 1 tablet (15 mg total) by mouth daily. As directed   No facility-administered encounter medications on file as of 04/23/2022.    Allergies as of 04/23/2022 - Review Complete 04/23/2022  Allergen Reaction Noted   Codeine Anaphylaxis and Other (See Comments) 12/25/2010   Ace inhibitors Cough 01/16/2015    Past Medical History:   Diagnosis Date   Allergic rhinitis    Worse in winter , grasses   Asthma    Worse in winter   Gall stone pancreatitis    lap choley 1 2017   GERD (gastroesophageal reflux disease)    Headache(784.0)    "bi-weekly" (09/03/2015)   HEMORRHAGIC CYSTITIS 05/25/2009   Qualifier: Diagnosis of  By: Regis Bill MD, Standley Brooking    Hypertension    Iron deficiency anemia 05/01/2015   Migraine    "q couple months" (09/03/2015)   Pneumonia    "a few times" (09/03/2015)   SVT (supraventricular tachycardia) ([place])    No longer a problem    Past Surgical History:  Procedure Laterality Date   ANTERIOR CERVICAL DECOMP/DISCECTOMY FUSION  2008   [place]; 1991   CHOLECYSTECTOMY N/A 09/04/2015   Procedure: LAPAROSCOPIC CHOLECYSTECTOMY WITH INTRAOPERATIVE CHOLANGIOGRAM;  Surgeon: Greer Pickerel, MD;  Location: [facility];  Service: General;  Laterality: N/A;   OOPHORECTOMY  1980s   "all of the right; part of the left"   VAGINAL HYSTERECTOMY  2010    Family History  Problem Relation Age of Onset   Other Mother        Died of asphyxiation   Prostate cancer Father     Social History   Socioeconomic History   Marital status:  Married    Spouse name: Not on file   Number of children: Not on file   Years of education: Not on file   Highest education level: Associate degree: occupational, Hotel manager, or vocational program  Occupational History   Not on file  Tobacco Use   Smoking status: Never   Smokeless tobacco: Never   Tobacco comments:    "stopped smoking in the 1980s"  Vaping Use   Vaping Use: Never used  Substance and Sexual Activity   Alcohol use: Yes    Alcohol/week: 8.0 standard drinks of alcohol    Types: 4 Glasses of wine, 4 Shots of liquor per week   Drug use: No   Sexual activity: Yes  Other Topics Concern   Not on file  Social History Narrative   Married   Employed [employer]  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    HH of 2     Husband has a Midwife      No ets   Drinks milk and women's MV   Soc etoh only rare no tob ets   Social Determinants of Radio broadcast assistant Strain: Low Risk  (03/08/2022)   Overall Financial Resource Strain (CARDIA)    Difficulty of Paying Living Expenses: Not hard at all  Food Insecurity: No Food Insecurity (03/08/2022)   Hunger Vital Sign    Worried About Running Out of Food in the Last Year: Never true    Ran Out of Food in the Last Year: Never true  Transportation Needs: No Transportation Needs (03/08/2022)   PRAPARE - Hydrologist (Medical): No    Lack of Transportation (Non-Medical): No  Physical Activity: Insufficiently Active (03/08/2022)   Exercise Vital Sign    Days of Exercise per Week: 2 days    Minutes of Exercise per Session: 30 min  Stress: Stress Concern Present (03/08/2022)   [place]    Feeling of Stress : To some extent  Social Connections: Moderately Isolated (03/08/2022)   Social Connection and Isolation Panel [NHANES]    Frequency of Communication with Friends and Family: More than three times a week    Frequency of Social Gatherings with Friends and Family: Three times a week    Attends Religious Services: Never    Active Member of Clubs or Organizations: No    Attends Music therapist: Not on file    Marital Status: Married  Human resources officer Violence: Not on file    Review of Systems  Constitutional: Negative.   HENT: Negative.    Eyes: Negative.   Respiratory: Negative.    Cardiovascular:  Negative for chest pain.  Psychiatric/Behavioral:  Positive for sleep disturbance.     Vitals:   04/23/22 0938  BP: 122/80  Pulse: 78  Temp: 97.9 F (36.6 C)  SpO2: 99%   Physical Exam Constitutional:      Appearance: She is obese.  HENT:     Head: Normocephalic.     Mouth/Throat:     Mouth: Mucous membranes are moist.  Eyes:      Pupils: Pupils are equal, round, and reactive to light.  Cardiovascular:     Rate and Rhythm: Normal rate and regular rhythm.     Pulses: Normal pulses.     Heart sounds: No murmur heard.    No friction rub.  Pulmonary:     Effort: Pulmonary effort is  normal. No respiratory distress.     Breath sounds: Normal breath sounds. No stridor. No wheezing or rhonchi.  Musculoskeletal:        General: Normal range of motion.     Cervical back: No rigidity.  Skin:    General: Skin is warm.  Neurological:     General: No focal deficit present.     Mental Status: She is alert.  Psychiatric:        Mood and Affect: Mood normal.       04/05/2020    2:00 PM  Results of the Epworth flowsheet  Sitting and reading 2  Watching TV 3  Sitting, inactive in a public place (e.g. a theatre or a meeting) 1  As a passenger in a car for an hour without a break 3  Lying down to rest in the afternoon when circumstances permit 3  Sitting and talking to someone 0  Sitting quietly after a lunch without alcohol 1  In a car, while stopped for a few minutes in traffic 0  Total score 13    Assessment:  Severe obstructive sleep apnea on BiPAP therapy -Has not been able to tolerate BiPAP pressures  Class III obesity  Excessive daytime sleepiness  BiPAP intolerant secondary to pressure Has tried different masks  Plan/Recommendations: DME referral for BiPAP pressure change  Reduce pressure from 21/17-18/12  Encouraged to use BiPAP on a regular basis and contact us with any concerns  We can adjust BiPAP pressures to patient's comfort and then focus on optimal treatment  Weight loss efforts encouraged   Sherrilyn Rist MD [facility] 04/23/2022, 12:07 PM  CC: Panosh, Standley Brooking, MD

## 2022-04-23 NOTE — Patient Instructions (Signed)
Obstructive sleep apnea-inability to tolerate pressures  Contact [facility] -Decrease pressures from 21/17-18/12  Try and use your BiPAP on a nightly basis as tolerated Call us if you notice any specific concerns that we may be able to address  We have room to adjust the pressures as tolerated  Follow-up in 3 months

## 2022-04-29 ENCOUNTER — Telehealth: Payer: Self-pay | Admitting: Pulmonary Disease

## 2022-04-29 NOTE — Telephone Encounter (Signed)
Called and spoke with pt's spouse Danielle Martin who states that they just received a call from [facility] and said that settings should be changed that pt just needs to turn her machine off and turn it back on to get the settings changed. Nothing further needed.

## 2022-05-01 ENCOUNTER — Other Ambulatory Visit: Payer: Self-pay | Admitting: Internal Medicine

## 2022-05-04 MED ORDER — ESCITALOPRAM OXALATE 10 MG PO TABS: 10.0000 mg | ORAL_TABLET | Freq: Every day | ORAL | 0 refills | Status: DC

## 2022-05-05 NOTE — Telephone Encounter (Signed)
Spoke to [place] with [facility]. Inform him we did not receive a fax request. Gave him the correct fax number [phone] for him to refax.

## 2022-05-15 ENCOUNTER — Ambulatory Visit: Payer: 59 | Admitting: Pulmonary Disease

## 2022-05-26 NOTE — Telephone Encounter (Signed)
Patient was seen by AO on 09/06. Will close encounter.

## 2022-07-09 ENCOUNTER — Telehealth: Payer: Self-pay

## 2022-07-09 NOTE — Telephone Encounter (Signed)
Contacted [facility] in regards to receive fax for CPAP. Spoke to Darnel, inform him that patient has a pulmonologist that she sees they can sign off the order for patient.   Verbalized understanding. No further action needed.

## 2022-07-21 ENCOUNTER — Encounter: Payer: Self-pay | Admitting: Internal Medicine

## 2022-07-23 ENCOUNTER — Ambulatory Visit: Payer: 59 | Admitting: Pulmonary Disease

## 2022-07-24 ENCOUNTER — Telehealth: Payer: 59 | Admitting: Physician Assistant

## 2022-07-24 DIAGNOSIS — U071 COVID-19: Secondary | ICD-10-CM

## 2022-07-24 MED ORDER — BENZONATATE 100 MG PO CAPS: 100.0000 mg | ORAL_CAPSULE | Freq: Three times a day (TID) | ORAL | 0 refills | Status: DC | PRN

## 2022-07-24 MED ORDER — NIRMATRELVIR/RITONAVIR (PAXLOVID)TABLET: 3.0000 | ORAL_TABLET | Freq: Two times a day (BID) | ORAL | 0 refills | Status: AC

## 2022-07-24 NOTE — Patient Instructions (Signed)
Fransico Setters Dudek, thank you for joining Leeanne Rio, PA-C for today's virtual visit.  While this provider is not your primary care provider (PCP), if your PCP is located in our provider database this encounter information will be shared with them immediately following your visit.   [facility] account gives you access to today's visit and all your visits, tests, and labs performed at [facility] " click here if you don't have a [facility] account or go to [email or web address]  Consent: (Patient) Shondell Fabel Rodier provided verbal consent for this virtual visit at the beginning of the encounter.  Current Medications:  Current Outpatient Medications:    benzonatate (TESSALON) 100 MG capsule, Take 1 capsule (100 mg total) by mouth 3 (three) times daily as needed for cough., Disp: 30 capsule, Rfl: 0   nirmatrelvir/ritonavir EUA (PAXLOVID) 20 x 150 MG & 10 x '100MG'$  TABS, Take 3 tablets by mouth 2 (two) times daily for 5 days. (Take nirmatrelvir 150 mg two tablets twice daily for 5 days and ritonavir 100 mg one tablet twice daily for 5 days) Patient GFR is > 60, Disp: 30 tablet, Rfl: 0   albuterol (VENTOLIN HFA) 108 (90 Base) MCG/ACT inhaler, Inhale 2 puffs into the lungs every 6 (six) hours as needed for wheezing., Disp: 18 g, Rfl: 1   Calcium Carbonate-Vitamin D 600-400 MG-UNIT per chew tablet, Chew 1 tablet by mouth daily., Disp: , Rfl:    CINNAMON PO, Take 1,000 Units by mouth every morning., Disp: , Rfl:    escitalopram (LEXAPRO) 10 MG tablet, Take 1 tablet (10 mg total) by mouth daily., Disp: 90 tablet, Rfl: 0   LORazepam (ATIVAN) 0.5 MG tablet, 1 po if needed,  Up to  Every  8 hours  For anxiety, Disp: 20 tablet, Rfl: 0   losartan-hydrochlorothiazide (HYZAAR) 50-12.5 MG tablet, TAKE 1 TABLET DAILY, Disp: 90 tablet, Rfl: 3   metoprolol succinate (TOPROL-XL) 50 MG 24 hr tablet, TAKE 1 TABLET DAILY WITH ORIMMEDIATELY FOLLOWING A    MEAL, Disp: 90 tablet, Rfl:  3   Multiple Vitamin (MULTI-VITAMIN PO), Take 1 tablet by mouth daily. , Disp: , Rfl:    vitamin E 400 UNIT capsule, Take 400 Units by mouth daily., Disp: , Rfl:    Medications ordered in this encounter:  Meds ordered this encounter  Medications   benzonatate (TESSALON) 100 MG capsule    Sig: Take 1 capsule (100 mg total) by mouth 3 (three) times daily as needed for cough.    Dispense:  30 capsule    Refill:  0    Order Specific Question:   Supervising Provider    Answer:   Chase Picket [id number]   nirmatrelvir/ritonavir EUA (PAXLOVID) 20 x 150 MG & 10 x '100MG'$  TABS    Sig: Take 3 tablets by mouth 2 (two) times daily for 5 days. (Take nirmatrelvir 150 mg two tablets twice daily for 5 days and ritonavir 100 mg one tablet twice daily for 5 days) Patient GFR is > 60    Dispense:  30 tablet    Refill:  0    Order Specific Question:   Supervising Provider    Answer:   Chase Picket [id number]     *If you need refills on other medications prior to your next appointment, please contact your pharmacy*  Follow-Up: Call back or seek an in-person evaluation if the symptoms worsen or if the condition fails to improve as anticipated.  [facility]  [facility] [phone]  Other Instructions Please keep well-hydrated and get plenty of rest. Start a saline nasal rinse to flush out your nasal passages. You can use plain Mucinex to help thin congestion. If you have a humidifier, running in the bedroom at night. I want you to start OTC vitamin D3 1000 units daily, vitamin C 1000 mg daily, and a zinc supplement. Please take prescribed medications as directed.  You have been enrolled in a MyChart symptom monitoring program. Please answer these questions daily so we can keep track of how you are doing.  You were to quarantine for 5 days from onset of your symptoms.  After day 5, if you have had no fever and you are feeling better, you can end quarantine but need to mask for an additional  5 days. After day 5 if you have a fever or are having significant symptoms, please quarantine for full 10 days.  If you note any worsening of symptoms, any significant shortness of breath or any chest pain, please seek ER evaluation ASAP.  Please do not delay care!  COVID-19: What to Do if You Are Sick If you test positive and are an older adult or someone who is at high risk of getting very sick from COVID-19, treatment may be available. Contact a healthcare provider right away after a positive test to determine if you are eligible, even if your symptoms are mild right now. You can also visit a Test to Treat location and, if eligible, receive a prescription from a provider. Don't delay: Treatment must be started within the first few days to be effective. If you have a fever, cough, or other symptoms, you might have COVID-19. Most people have mild illness and are able to recover at home. If you are sick: Keep track of your symptoms. If you have an emergency warning sign (including trouble breathing), call 911. Steps to help prevent the spread of COVID-19 if you are sick If you are sick with COVID-19 or think you might have COVID-19, follow the steps below to care for yourself and to help protect other people in your home and community. Stay home except to get medical care Stay home. Most people with COVID-19 have mild illness and can recover at home without medical care. Do not leave your home, except to get medical care. Do not visit public areas and do not go to places where you are unable to wear a mask. Take care of yourself. Get rest and stay hydrated. Take over-the-counter medicines, such as acetaminophen, to help you feel better. Stay in touch with your doctor. Call before you get medical care. Be sure to get care if you have trouble breathing, or have any other emergency warning signs, or if you think it is an emergency. Avoid public transportation, ride-sharing, or taxis if possible. Get  tested If you have symptoms of COVID-19, get tested. While waiting for test results, stay away from others, including staying apart from those living in your household. Get tested as soon as possible after your symptoms start. Treatments may be available for people with COVID-19 who are at risk for becoming very sick. Don't delay: Treatment must be started early to be effective--some treatments must begin within 5 days of your first symptoms. Contact your healthcare provider right away if your test result is positive to determine if you are eligible. Self-tests are one of several options for testing for the virus that causes COVID-19 and may be more convenient than laboratory-based tests and  point-of-care tests. Ask your healthcare provider or your local health department if you need help interpreting your test results. You can visit your state, tribal, local, and territorial health department's website to look for the latest local information on testing sites. Separate yourself from other people As much as possible, stay in a specific room and away from other people and pets in your home. If possible, you should use a separate bathroom. If you need to be around other people or animals in or outside of the home, wear a well-fitting mask. Tell your close contacts that they may have been exposed to COVID-19. An infected person can spread COVID-19 starting 48 hours (or 2 days) before the person has any symptoms or tests positive. By letting your close contacts know they may have been exposed to COVID-19, you are helping to protect everyone. See COVID-19 and Animals if you have questions about pets. If you are diagnosed with COVID-19, someone from the health department may call you. Answer the call to slow the spread. Monitor your symptoms Symptoms of COVID-19 include fever, cough, or other symptoms. Follow care instructions from your healthcare provider and local health department. Your local health  authorities may give instructions on checking your symptoms and reporting information. When to seek emergency medical attention Look for emergency warning signs* for COVID-19. If someone is showing any of these signs, seek emergency medical care immediately: Trouble breathing Persistent pain or pressure in the chest New confusion Inability to wake or stay awake Pale, gray, or blue-colored skin, lips, or nail beds, depending on skin tone *This list is not all possible symptoms. Please call your medical provider for any other symptoms that are severe or concerning to you. Call 911 or call ahead to your local emergency facility: Notify the operator that you are seeking care for someone who has or may have COVID-19. Call ahead before visiting your doctor Call ahead. Many medical visits for routine care are being postponed or done by phone or telemedicine. If you have a medical appointment that cannot be postponed, call your doctor's office, and tell them you have or may have COVID-19. This will help the office protect themselves and other patients. If you are sick, wear a well-fitting mask You should wear a mask if you must be around other people or animals, including pets (even at home). Wear a mask with the best fit, protection, and comfort for you. You don't need to wear the mask if you are alone. If you can't put on a mask (because of trouble breathing, for example), cover your coughs and sneezes in some other way. Try to stay at least 6 feet away from other people. This will help protect the people around you. Masks should not be placed on young children under age 36 years, anyone who has trouble breathing, or anyone who is not able to remove the mask without help. Cover your coughs and sneezes Cover your mouth and nose with a tissue when you cough or sneeze. Throw away used tissues in a lined trash can. Immediately wash your hands with soap and water for at least 20 seconds. If soap and water  are not available, clean your hands with an alcohol-based hand sanitizer that contains at least 60% alcohol. Clean your hands often Wash your hands often with soap and water for at least 20 seconds. This is especially important after blowing your nose, coughing, or sneezing; going to the bathroom; and before eating or preparing food. Use hand sanitizer if soap  and water are not available. Use an alcohol-based hand sanitizer with at least 60% alcohol, covering all surfaces of your hands and rubbing them together until they feel dry. Soap and water are the best option, especially if hands are visibly dirty. Avoid touching your eyes, nose, and mouth with unwashed hands. Handwashing Tips Avoid sharing personal household items Do not share dishes, drinking glasses, cups, eating utensils, towels, or bedding with other people in your home. Wash these items thoroughly after using them with soap and water or put in the dishwasher. Clean surfaces in your home regularly Clean and disinfect high-touch surfaces (for example, doorknobs, tables, handles, light switches, and countertops) in your "sick room" and bathroom. In shared spaces, you should clean and disinfect surfaces and items after each use by the person who is ill. If you are sick and cannot clean, a caregiver or other person should only clean and disinfect the area around you (such as your bedroom and bathroom) on an as needed basis. Your caregiver/other person should wait as long as possible (at least several hours) and wear a mask before entering, cleaning, and disinfecting shared spaces that you use. Clean and disinfect areas that may have blood, stool, or body fluids on them. Use household cleaners and disinfectants. Clean visible dirty surfaces with household cleaners containing soap or detergent. Then, use a household disinfectant. Use a product from [employer] List N: Disinfectants for Coronavirus (QQVZD-63). Be sure to follow the instructions on the  label to ensure safe and effective use of the product. Many products recommend keeping the surface wet with a disinfectant for a certain period of time (look at "contact time" on the product label). You may also need to wear personal protective equipment, such as gloves, depending on the directions on the product label. Immediately after disinfecting, wash your hands with soap and water for 20 seconds. For completed guidance on cleaning and disinfecting your home, visit Complete Disinfection Guidance. Take steps to improve ventilation at home Improve ventilation (air flow) at home to help prevent from spreading COVID-19 to other people in your household. Clear out COVID-19 virus particles in the air by opening windows, using air filters, and turning on fans in your home. Use this interactive tool to learn how to improve air flow in your home. When you can be around others after being sick with COVID-19 Deciding when you can be around others is different for different situations. Find out when you can safely end home isolation. For any additional questions about your care, contact your healthcare provider or state or local health department. 11/06/2020 Content source: [facility] Immunization and [facility]piratory Diseases ([facility]), Division of Viral Diseases This information is not intended to replace advice given to you by your health care provider. Make sure you discuss any questions you have with your health care provider. Document Revised: 12/20/2020 Document Reviewed: 12/20/2020 Elsevier Patient Education  2022 [employer].      If you have been instructed to have an in-person evaluation today at a local Urgent Care facility, please use the link below. It will take you to a list of all of our available [facility], including address, phone number and hours of operation. Please do not delay care.  [facility]  If you or a family member do not have a primary  care provider, use the link below to schedule a visit and establish care. When you choose a [facility] primary care physician or advanced practice provider, you  gain a long-term partner in health. Find a Primary Care Provider  Learn more about [facility] in-office and virtual care options: [facility]

## 2022-07-24 NOTE — Progress Notes (Signed)
Virtual Visit Consent   Danielle Martin, you are scheduled for a virtual visit with a [facility] provider today. Just as with appointments in the office, your consent must be obtained to participate. Your consent will be active for this visit and any virtual visit you may have with one of our providers in the next 365 days. If you have a MyChart account, a copy of this consent can be sent to you electronically.  As this is a virtual visit, video technology does not allow for your provider to perform a traditional examination. This may limit your provider's ability to fully assess your condition. If your provider identifies any concerns that need to be evaluated in person or the need to arrange testing (such as labs, EKG, etc.), we will make arrangements to do so. Although advances in technology are sophisticated, we cannot ensure that it will always work on either your end or our end. If the connection with a video visit is poor, the visit may have to be switched to a telephone visit. With either a video or telephone visit, we are not always able to ensure that we have a secure connection.  By engaging in this virtual visit, you consent to the provision of healthcare and authorize for your insurance to be billed (if applicable) for the services provided during this visit. Depending on your insurance coverage, you may receive a charge related to this service.  I need to obtain your verbal consent now. Are you willing to proceed with your visit today? Danielle Martin has provided verbal consent on 07/24/2022 for a virtual visit (video or telephone). Leeanne Rio, [place]  Date: 07/24/2022 4:54 PM  Virtual Visit via Video Note   I, Leeanne Rio, connected with  Danielle Martin  ([id number], [birth year]) on 07/24/22 at  4:45 PM EST by a video-enabled telemedicine application and verified that I am speaking with the correct person using two identifiers.  Location: Patient: Virtual Visit Location  Patient: Home Provider: Virtual Visit Location Provider: Home Office   I discussed the limitations of evaluation and management by telemedicine and the availability of in person appointments. The patient expressed understanding and agreed to proceed.    History of Present Illness: Danielle Martin is a 61 y.o. who identifies as a female who was assigned female at birth, and is being seen today for COVID-19 Notes Sunday AM with ST, body aches, fatigue and low-grade fever. Since then noting cough and congestion. Tested Tuesday for COVID which came back positive. Notes fever resolved but coughing has been quite significant and causing post-tussive gagging at times. Has taken Coricidin HBP and Ibuprofen OTC.    HPI: HPI  Problems:  Patient Active Problem List   Diagnosis Date Noted   OSA (obstructive sleep apnea) 04/08/2021   Cervical radiculopathy 10/25/2016   HLD (hyperlipidemia) 04/08/2016   Hypertension 11/25/2013   Neoplasm of uncertain behavior of skin 07/20/2012   Visit for preventive health examination 07/20/2012   SVT (supraventricular tachycardia) (HCC)    Allergic rhinitis 05/13/2008   OBESITY, UNSPECIFIED 05/08/2008   Thrombocytosis 05/08/2008   ASTHMA 05/08/2008   OTHER SLEEP DISTURBANCES 05/08/2008    Allergies:  Allergies  Allergen Reactions   Codeine Anaphylaxis and Other (See Comments)    Hallucinations Codeine causes hallucinations   Ace Inhibitors Cough   Medications:  Current Outpatient Medications:    benzonatate (TESSALON) 100 MG capsule, Take 1 capsule (100 mg total) by mouth 3 (three) times daily as needed  for cough., Disp: 30 capsule, Rfl: 0   nirmatrelvir/ritonavir EUA (PAXLOVID) 20 x 150 MG & 10 x '100MG'$  TABS, Take 3 tablets by mouth 2 (two) times daily for 5 days. (Take nirmatrelvir 150 mg two tablets twice daily for 5 days and ritonavir 100 mg one tablet twice daily for 5 days) Patient GFR is > 60, Disp: 30 tablet, Rfl: 0   albuterol (VENTOLIN HFA) 108 (90  Base) MCG/ACT inhaler, Inhale 2 puffs into the lungs every 6 (six) hours as needed for wheezing., Disp: 18 g, Rfl: 1   Calcium Carbonate-Vitamin D 600-400 MG-UNIT per chew tablet, Chew 1 tablet by mouth daily., Disp: , Rfl:    CINNAMON PO, Take 1,000 Units by mouth every morning., Disp: , Rfl:    escitalopram (LEXAPRO) 10 MG tablet, Take 1 tablet (10 mg total) by mouth daily., Disp: 90 tablet, Rfl: 0   LORazepam (ATIVAN) 0.5 MG tablet, 1 po if needed,  Up to  Every  8 hours  For anxiety, Disp: 20 tablet, Rfl: 0   losartan-hydrochlorothiazide (HYZAAR) 50-12.5 MG tablet, TAKE 1 TABLET DAILY, Disp: 90 tablet, Rfl: 3   metoprolol succinate (TOPROL-XL) 50 MG 24 hr tablet, TAKE 1 TABLET DAILY WITH ORIMMEDIATELY FOLLOWING A    MEAL, Disp: 90 tablet, Rfl: 3   Multiple Vitamin (MULTI-VITAMIN PO), Take 1 tablet by mouth daily. , Disp: , Rfl:    vitamin E 400 UNIT capsule, Take 400 Units by mouth daily., Disp: , Rfl:   Observations/Objective: Patient is well-developed, well-nourished in no acute distress.  Resting comfortably at home.  Head is normocephalic, atraumatic.  No labored breathing. Speech is clear and coherent with logical content.  Patient is alert and oriented at baseline.   Assessment and Plan: 1. COVID-19 - MyChart COVID-19 home monitoring program; Future - benzonatate (TESSALON) 100 MG capsule; Take 1 capsule (100 mg total) by mouth 3 (three) times daily as needed for cough.  Dispense: 30 capsule; Refill: 0 - nirmatrelvir/ritonavir EUA (PAXLOVID) 20 x 150 MG & 10 x '100MG'$  TABS; Take 3 tablets by mouth 2 (two) times daily for 5 days. (Take nirmatrelvir 150 mg two tablets twice daily for 5 days and ritonavir 100 mg one tablet twice daily for 5 days) Patient GFR is > 60  Dispense: 30 tablet; Refill: 0  Patient with multiple risk factors for complicated course of illness. Discussed risks/benefits of antiviral medications including most common potential ADRs. Patient voiced understanding and  would like to proceed with antiviral medication. They are candidate for Paxlovid. Rx sent to pharmacy. Supportive measures, OTC medications and vitamin regimen reviewed. Resume albuterol when needed. Tessalon per orders. Patient has been enrolled in a MyChart COVID symptom monitoring program. Samule Dry reviewed in detail. Strict ER precautions discussed with patient.    Follow Up Instructions: I discussed the assessment and treatment plan with the patient. The patient was provided an opportunity to ask questions and all were answered. The patient agreed with the plan and demonstrated an understanding of the instructions.  A copy of instructions were sent to the patient via MyChart unless otherwise noted below.   The patient was advised to call back or seek an in-person evaluation if the symptoms worsen or if the condition fails to improve as anticipated.  Time:  I spent 10 minutes with the patient via telehealth technology discussing the above problems/concerns.    Leeanne Rio, PA-C

## 2022-07-31 ENCOUNTER — Other Ambulatory Visit: Payer: Self-pay | Admitting: Internal Medicine

## 2022-08-01 ENCOUNTER — Other Ambulatory Visit: Payer: Self-pay | Admitting: Internal Medicine

## 2022-08-03 ENCOUNTER — Encounter: Payer: Self-pay | Admitting: Internal Medicine

## 2022-08-04 ENCOUNTER — Other Ambulatory Visit: Payer: Self-pay

## 2022-08-04 DIAGNOSIS — U071 COVID-19: Secondary | ICD-10-CM

## 2022-08-04 MED ORDER — ALBUTEROL SULFATE HFA 108 (90 BASE) MCG/ACT IN AERS: 2.0000 | INHALATION_SPRAY | Freq: Four times a day (QID) | RESPIRATORY_TRACT | 0 refills | Status: DC | PRN

## 2022-08-04 NOTE — Telephone Encounter (Signed)
Please call in one inhaler

## 2022-08-17 ENCOUNTER — Other Ambulatory Visit: Payer: Self-pay | Admitting: Internal Medicine

## 2022-09-01 ENCOUNTER — Other Ambulatory Visit: Payer: Self-pay | Admitting: Family Medicine

## 2022-09-01 DIAGNOSIS — U071 COVID-19: Secondary | ICD-10-CM

## 2022-10-29 ENCOUNTER — Other Ambulatory Visit: Payer: Self-pay | Admitting: Internal Medicine

## 2022-10-30 ENCOUNTER — Other Ambulatory Visit: Payer: Self-pay | Admitting: Internal Medicine

## 2022-10-31 ENCOUNTER — Other Ambulatory Visit: Payer: Self-pay | Admitting: Internal Medicine

## 2022-12-22 ENCOUNTER — Encounter: Payer: Self-pay | Admitting: Internal Medicine

## 2023-01-31 ENCOUNTER — Other Ambulatory Visit: Payer: Self-pay | Admitting: Family

## 2023-02-05 ENCOUNTER — Other Ambulatory Visit: Payer: Self-pay

## 2023-02-05 ENCOUNTER — Telehealth: Payer: Self-pay

## 2023-02-05 MED ORDER — LOSARTAN POTASSIUM-HCTZ 50-12.5 MG PO TABS: 1.0000 | ORAL_TABLET | Freq: Every day | ORAL | 0 refills | Status: DC

## 2023-02-05 NOTE — Telephone Encounter (Signed)
Attempted to reach pt to schedule cpe. Voicemail is full.

## 2023-02-06 ENCOUNTER — Other Ambulatory Visit: Payer: Self-pay | Admitting: Internal Medicine

## 2023-02-06 MED ORDER — ESCITALOPRAM OXALATE 10 MG PO TABS: 10.0000 mg | ORAL_TABLET | Freq: Every day | ORAL | 0 refills | Status: DC

## 2023-03-02 DIAGNOSIS — D225 Melanocytic nevi of trunk: Secondary | ICD-10-CM | POA: Diagnosis not present

## 2023-03-02 DIAGNOSIS — L821 Other seborrheic keratosis: Secondary | ICD-10-CM | POA: Diagnosis not present

## 2023-03-02 DIAGNOSIS — F424 Excoriation (skin-picking) disorder: Secondary | ICD-10-CM | POA: Diagnosis not present

## 2023-03-02 DIAGNOSIS — L814 Other melanin hyperpigmentation: Secondary | ICD-10-CM | POA: Diagnosis not present

## 2023-03-16 ENCOUNTER — Other Ambulatory Visit: Payer: Self-pay | Admitting: Internal Medicine

## 2023-03-17 MED ORDER — METOPROLOL SUCCINATE ER 50 MG PO TB24: ORAL_TABLET | ORAL | 0 refills | Status: DC

## 2023-04-23 ENCOUNTER — Other Ambulatory Visit: Payer: Self-pay | Admitting: Internal Medicine

## 2023-05-02 ENCOUNTER — Other Ambulatory Visit: Payer: Self-pay | Admitting: Family

## 2023-05-06 ENCOUNTER — Encounter: Payer: Self-pay | Admitting: Internal Medicine

## 2023-05-07 MED ORDER — ESCITALOPRAM OXALATE 10 MG PO TABS: 10.0000 mg | ORAL_TABLET | Freq: Every day | ORAL | 0 refills | Status: DC

## 2023-05-07 NOTE — Telephone Encounter (Signed)
Attempted to reach pt. Voicemail is full.

## 2023-06-03 ENCOUNTER — Other Ambulatory Visit: Payer: Self-pay | Admitting: Internal Medicine

## 2023-06-03 DIAGNOSIS — Z1231 Encounter for screening mammogram for malignant neoplasm of breast: Secondary | ICD-10-CM

## 2023-06-12 ENCOUNTER — Other Ambulatory Visit: Payer: Self-pay | Admitting: Family Medicine

## 2023-06-12 ENCOUNTER — Other Ambulatory Visit: Payer: Self-pay | Admitting: Family

## 2023-06-12 DIAGNOSIS — U071 COVID-19: Secondary | ICD-10-CM

## 2023-06-26 ENCOUNTER — Ambulatory Visit
Admission: RE | Admit: 2023-06-26 | Discharge: 2023-06-26 | Disposition: A | Discharge status: Home / Self Care | Payer: 59 | Source: Ambulatory Visit

## 2023-06-26 DIAGNOSIS — Z1231 Encounter for screening mammogram for malignant neoplasm of breast: Secondary | ICD-10-CM

## 2023-06-27 ENCOUNTER — Encounter: Payer: Self-pay | Admitting: Internal Medicine

## 2023-06-29 MED ORDER — METOPROLOL SUCCINATE ER 50 MG PO TB24: ORAL_TABLET | ORAL | 0 refills | Status: DC

## 2023-07-13 ENCOUNTER — Other Ambulatory Visit: Payer: Self-pay | Admitting: Internal Medicine

## 2023-07-21 NOTE — Progress Notes (Unsigned)
No chief complaint on file.   HPI: Patient  Danielle Martin  62 y.o. comes in today for Preventive Health Care visit   HT Mood  Resp   Health Maintenance  Topic Date Due   HIV Screening  Never done   Zoster Vaccines- Shingrix (1 of 2) Never done   DTaP/Tdap/Td (2 - Td or Tdap) 12/24/2020   Colonoscopy  02/19/2021   INFLUENZA VACCINE  03/19/2023   COVID-19 Vaccine (6 - 2023-24 season) 04/19/2023   MAMMOGRAM  06/25/2025   Hepatitis C Screening  Completed   HPV VACCINES  Aged Out   Health Maintenance Review LIFESTYLE:  Exercise:   Tobacco/ETS: Alcohol:  Sugar beverages: Sleep: Drug use: no HH of  Work:    ROS:  GEN/ HEENT: No fever, significant weight changes sweats headaches vision problems hearing changes, CV/ PULM; No chest pain shortness of breath cough, syncope,edema  change in exercise tolerance. GI /GU: No adominal pain, vomiting, change in bowel habits. No blood in the stool. No significant GU symptoms. SKIN/HEME: ,no acute skin rashes suspicious lesions or bleeding. No lymphadenopathy, nodules, masses.  NEURO/ PSYCH:  No neurologic signs such as weakness numbness. No depression anxiety. IMM/ Allergy: No unusual infections.  Allergy .   REST of 12 system review negative except as per HPI   Past Medical History:  Diagnosis Date   Allergic rhinitis    Worse in winter , grasses   Asthma    Worse in winter   Gall stone pancreatitis    lap choley 1 2017   GERD (gastroesophageal reflux disease)    Headache(784.0)    "bi-weekly" (09/03/2015)   HEMORRHAGIC CYSTITIS 05/25/2009   Qualifier: Diagnosis of  By: Fabian Sharp MD, Neta Mends    Hypertension    Iron deficiency anemia 05/01/2015   Migraine    "q couple months" (09/03/2015)   Pneumonia    "a few times" (09/03/2015)   SVT (supraventricular tachycardia) (HCC)    No longer a problem    Past Surgical History:  Procedure Laterality Date   ANTERIOR CERVICAL DECOMP/DISCECTOMY FUSION  2008   BACK SURGERY      CESAREAN SECTION  1990; 1991   CHOLECYSTECTOMY N/A 09/04/2015   Procedure: LAPAROSCOPIC CHOLECYSTECTOMY WITH INTRAOPERATIVE CHOLANGIOGRAM;  Surgeon: Gaynelle Adu, MD;  Location: [facility] OR;  Service: General;  Laterality: N/A;   OOPHORECTOMY  1980s   "all of the right; part of the left"   VAGINAL HYSTERECTOMY  2010    Family History  Problem Relation Age of Onset   Other Mother        Died of asphyxiation   Prostate cancer Father    Breast cancer Maternal Grandmother 48   BRCA 1/2 Neg Hx     Social History   Socioeconomic History   Marital status: Married    Spouse name: Not on file   Number of children: Not on file   Years of education: Not on file   Highest education level: Associate degree: occupational, Scientist, product/process development, or vocational program  Occupational History   Not on file  Tobacco Use   Smoking status: Never   Smokeless tobacco: Never   Tobacco comments:    "stopped smoking in the 1980s"  Vaping Use   Vaping status: Never Used  Substance and Sexual Activity   Alcohol use: Yes    Alcohol/week: 8.0 standard drinks of alcohol    Types: 4 Glasses of wine, 4 Shots of liquor per week   Drug use: No  Sexual activity: Yes  Other Topics Concern   Not on file  Social History Narrative   Married   Employed [employer]  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    HH of 2    Husband has a Librarian, academic      No ets   Drinks milk and women's MV   Soc etoh only rare no tob ets   Social Determinants of Corporate investment banker Strain: Low Risk  (07/20/2023)   Overall Financial Resource Strain (CARDIA)    Difficulty of Paying Living Expenses: Not hard at all  Food Insecurity: No Food Insecurity (07/20/2023)   Hunger Vital Sign    Worried About Running Out of Food in the Last Year: Never true    Ran Out of Food in the Last Year: Never true  Transportation Needs: No Transportation Needs (07/20/2023)   PRAPARE - Administrator, Civil Service (Medical):  No    Lack of Transportation (Non-Medical): No  Physical Activity: Unknown (07/20/2023)   Exercise Vital Sign    Days of Exercise per Week: 0 days    Minutes of Exercise per Session: Not on file  Stress: Stress Concern Present (07/20/2023)   [employer] of Occupational Health - Occupational Stress Questionnaire    Feeling of Stress : To some extent  Social Connections: Moderately Isolated (07/20/2023)   Social Connection and Isolation Panel [NHANES]    Frequency of Communication with Friends and Family: More than three times a week    Frequency of Social Gatherings with Friends and Family: Once a week    Attends Religious Services: Never    Database administrator or Organizations: No    Attends Engineer, structural: Not on file    Marital Status: Married    Outpatient Medications Prior to Visit  Medication Sig Dispense Refill   albuterol (VENTOLIN HFA) 108 (90 Base) MCG/ACT inhaler INHALE 2 PUFFS INTO THE LUNGS EVERY 6 HOURS AS NEEDED FOR WHEEZE 8.5 each 1   benzonatate (TESSALON) 100 MG capsule Take 1 capsule (100 mg total) by mouth 3 (three) times daily as needed for cough. 30 capsule 0   Calcium Carbonate-Vitamin D 600-400 MG-UNIT per chew tablet Chew 1 tablet by mouth daily.     CINNAMON PO Take 1,000 Units by mouth every morning.     escitalopram (LEXAPRO) 10 MG tablet TAKE 1 TABLET BY MOUTH EVERY DAY 30 tablet 2   LORazepam (ATIVAN) 0.5 MG tablet 1 po if needed,  Up to  Every  8 hours  For anxiety 20 tablet 0   losartan-hydrochlorothiazide (HYZAAR) 50-12.5 MG tablet TAKE 1 TABLET DAILY 90 tablet 0   metoprolol succinate (TOPROL-XL) 50 MG 24 hr tablet TAKE 1 TABLET DAILY WITH ORIMMEDIATELY FOLLOWING A    MEAL 90 tablet 0   Multiple Vitamin (MULTI-VITAMIN PO) Take 1 tablet by mouth daily.      vitamin E 400 UNIT capsule Take 400 Units by mouth daily.     No facility-administered medications prior to visit.     EXAM:  There were no vitals taken for this  visit.  There is no height or weight on file to calculate BMI. Wt Readings from Last 3 Encounters:  04/23/22 250 lb 6.4 oz (113.6 kg)  03/11/22 250 lb 9.6 oz (113.7 kg)  07/09/21 251 lb 3.2 oz (113.9 kg)    Physical Exam: Vital signs reviewed [id number] is a well-developed well-nourished alert cooperative  who appearsr stated age in no acute distress.  HEENT: normocephalic atraumatic , Eyes: PERRL EOM's full, conjunctiva clear, Nares: paten,t no deformity discharge or tenderness., Ears: no deformity EAC's clear TMs with normal landmarks. Mouth: clear OP, no lesions, edema.  Moist mucous membranes. Dentition in adequate repair. NECK: supple without masses, thyromegaly or bruits. CHEST/PULM:  Clear to auscultation and percussion breath sounds equal no wheeze , rales or rhonchi. No chest wall deformities or tenderness. Breast: normal by inspection . No dimpling, discharge, masses, tenderness or discharge . CV: PMI is nondisplaced, S1 S2 no gallops, murmurs, rubs. Peripheral pulses are full without delay.No JVD .  ABDOMEN: Bowel sounds normal nontender  No guard or rebound, no hepato splenomegal no CVA tenderness.  No hernia. Extremtities:  No clubbing cyanosis or edema, no acute joint swelling or redness no focal atrophy NEURO:  Oriented x3, cranial nerves 3-12 appear to be intact, no obvious focal weakness,gait within normal limits no abnormal reflexes or asymmetrical SKIN: No acute rashes normal turgor, color, no bruising or petechiae. PSYCH: Oriented, good eye contact, no obvious depression anxiety, cognition and judgment appear normal. LN: no cervical axillaryadenopathy  Lab Results  Component Value Date   WBC 9.9 03/11/2022   HGB 13.1 03/11/2022   HCT 39.2 03/11/2022   PLT 417.0 (H) 03/11/2022   GLUCOSE 101 (H) 03/11/2022   CHOL 218 (H) 07/09/2021   TRIG 186.0 (H) 07/09/2021   HDL 45.90 07/09/2021   LDLDIRECT 115.0 07/23/2018   LDLCALC 135 (H) 07/09/2021   ALT 17 03/11/2022    AST 20 03/11/2022   NA 140 03/11/2022   K 4.2 03/11/2022   CL 99 03/11/2022   CREATININE 0.69 03/11/2022   BUN 26 (H) 03/11/2022   CO2 32 03/11/2022   TSH 1.12 03/11/2022   INR 1.0 01/24/2009   HGBA1C 5.9 07/09/2021    BP Readings from Last 3 Encounters:  04/23/22 122/80  03/11/22 124/82  07/09/21 110/70    Lab plan  reviewed with patient   ASSESSMENT AND PLAN:  Discussed the following assessment and plan:    ICD-10-CM   1. Visit for preventive health examination  Z00.00     2. Primary hypertension  I10     3. Thrombocytosis  D75.839     4. Hyperlipidemia, unspecified hyperlipidemia type  E78.5      No follow-ups on file.  Patient Care Team: Ariaunna Longsworth, Neta Mends, MD as PCP - General Eldred Manges, MD as Attending Physician (Orthopedic Surgery) There are no Patient Instructions on file for this visit.  Neta Mends. Taheerah Guldin M.D.

## 2023-07-22 ENCOUNTER — Other Ambulatory Visit: Payer: Self-pay | Admitting: Internal Medicine

## 2023-07-22 ENCOUNTER — Ambulatory Visit (INDEPENDENT_AMBULATORY_CARE_PROVIDER_SITE_OTHER): Payer: 59 | Admitting: Internal Medicine

## 2023-07-22 ENCOUNTER — Encounter: Payer: Self-pay | Admitting: Internal Medicine

## 2023-07-22 VITALS — BP 104/80 | HR 64 | Temp 97.8°F | Ht 64.75 in | Wt 252.4 lb

## 2023-07-22 DIAGNOSIS — E66813 Obesity, class 3: Secondary | ICD-10-CM | POA: Diagnosis not present

## 2023-07-22 DIAGNOSIS — D75839 Thrombocytosis, unspecified: Secondary | ICD-10-CM | POA: Diagnosis not present

## 2023-07-22 DIAGNOSIS — Z23 Encounter for immunization: Secondary | ICD-10-CM

## 2023-07-22 DIAGNOSIS — Z114 Encounter for screening for human immunodeficiency virus [HIV]: Secondary | ICD-10-CM | POA: Diagnosis not present

## 2023-07-22 DIAGNOSIS — Z1159 Encounter for screening for other viral diseases: Secondary | ICD-10-CM

## 2023-07-22 DIAGNOSIS — Z6841 Body Mass Index (BMI) 40.0 and over, adult: Secondary | ICD-10-CM

## 2023-07-22 DIAGNOSIS — I10 Essential (primary) hypertension: Secondary | ICD-10-CM | POA: Diagnosis not present

## 2023-07-22 DIAGNOSIS — Z0000 Encounter for general adult medical examination without abnormal findings: Secondary | ICD-10-CM | POA: Diagnosis not present

## 2023-07-22 DIAGNOSIS — Z79899 Other long term (current) drug therapy: Secondary | ICD-10-CM

## 2023-07-22 DIAGNOSIS — E785 Hyperlipidemia, unspecified: Secondary | ICD-10-CM

## 2023-07-22 MED ORDER — METOPROLOL SUCCINATE ER 50 MG PO TB24: ORAL_TABLET | ORAL | 3 refills | Status: DC

## 2023-07-22 NOTE — Patient Instructions (Addendum)
Good to see  y ou today . Flu vaccine tdap ( 10 years)  and shingrix .    Second shingrix in 2-6 months  can get here or at a pharmacy.  Update labs today . Try adding resistance exercises 3  x per week 15 minutes  . Sleep optimization.

## 2023-07-23 LAB — LIPID PANEL
Cholesterol: 213 mg/dL — ABNORMAL HIGH (ref 0–200)
HDL: 41.4 mg/dL (ref >39.00)
LDL Cholesterol: 128 mg/dL — ABNORMAL HIGH (ref 0–99)
NonHDL: 171.16
Total CHOL/HDL Ratio: 5
Triglycerides: 218 mg/dL — ABNORMAL HIGH (ref 0.0–149.0)
VLDL: 43.6 mg/dL — ABNORMAL HIGH (ref 0.0–40.0)

## 2023-07-23 LAB — CBC WITH DIFFERENTIAL/PLATELET
Basophils Absolute: 0.1 10*3/uL (ref 0.0–0.1)
Basophils Relative: 1.1 % (ref 0.0–3.0)
Eosinophils Absolute: 0.3 10*3/uL (ref 0.0–0.7)
Eosinophils Relative: 4.6 % (ref 0.0–5.0)
HCT: 42.4 % (ref 36.0–46.0)
Hemoglobin: 14.1 g/dL (ref 12.0–15.0)
Lymphocytes Relative: 23.6 % (ref 12.0–46.0)
Lymphs Abs: 1.8 10*3/uL (ref 0.7–4.0)
MCHC: 33.3 g/dL (ref 30.0–36.0)
MCV: 83.7 fL (ref 78.0–100.0)
Monocytes Absolute: 0.4 10*3/uL (ref 0.1–1.0)
Monocytes Relative: 5.8 % (ref 3.0–12.0)
Neutro Abs: 4.9 10*3/uL (ref 1.4–7.7)
Neutrophils Relative %: 64.9 % (ref 43.0–77.0)
Platelets: 500 10*3/uL — ABNORMAL HIGH (ref 150.0–400.0)
RBC: 5.07 Mil/uL (ref 3.87–5.11)
RDW: 14.6 % (ref 11.5–15.5)
WBC: 7.6 10*3/uL (ref 4.0–10.5)

## 2023-07-23 LAB — COMPREHENSIVE METABOLIC PANEL
ALT: 17 U/L (ref 0–35)
AST: 20 U/L (ref 0–37)
Albumin: 4.4 g/dL (ref 3.5–5.2)
Alkaline Phosphatase: 48 U/L (ref 39–117)
BUN: 26 mg/dL — ABNORMAL HIGH (ref 6–23)
CO2: 33 meq/L — ABNORMAL HIGH (ref 19–32)
Calcium: 9.2 mg/dL (ref 8.4–10.5)
Chloride: 97 meq/L (ref 96–112)
Creatinine, Ser: 0.6 mg/dL (ref 0.40–1.20)
GFR: 97.57 mL/min (ref >60.00)
Glucose, Bld: 86 mg/dL (ref 70–99)
Potassium: 4.1 meq/L (ref 3.5–5.1)
Sodium: 137 meq/L (ref 135–145)
Total Bilirubin: 0.7 mg/dL (ref 0.2–1.2)
Total Protein: 7.4 g/dL (ref 6.0–8.3)

## 2023-07-23 LAB — HEPATITIS C ANTIBODY: Hepatitis C Ab: NONREACTIVE

## 2023-07-23 LAB — TSH: TSH: 0.96 u[IU]/mL (ref 0.35–5.50)

## 2023-07-23 LAB — HIV ANTIBODY (ROUTINE TESTING W REFLEX): HIV 1&2 Ab, 4th Generation: NONREACTIVE

## 2023-07-23 LAB — HEMOGLOBIN A1C: Hgb A1c MFr Bld: 5.9 % (ref 4.6–6.5)

## 2023-07-28 NOTE — Progress Notes (Signed)
Platelet count is up more  ( uncertain cause   inflammation or iron deficiency can do this also ) rest of lab  ok except cholesterol panel  is  still not optimal .  Avoiding refined carbs sugars  saturated fats and transfats exercise and increase fiber may help .  Let us know if you want to talk with a dietician   The 10-year ASCVD risk score (Arnett DK, et al., 2019) is: 3.7%   Values used to calculate the score:     Age: 62 years     Sex: Female     Is Non-Hispanic African American: No     Diabetic: No     Tobacco smoker: No     Systolic Blood Pressure: 104 mmHg     Is BP treated: Yes     HDL Cholesterol: 41.4 mg/dL     Total Cholesterol: 213 mg/dL  Plan repeat cbc platelets   in  4 months ( dont have to fast  ) dx elevated platelets

## 2023-08-26 ENCOUNTER — Other Ambulatory Visit: Payer: Self-pay | Admitting: Family

## 2023-10-20 ENCOUNTER — Other Ambulatory Visit: Payer: Self-pay | Admitting: Internal Medicine

## 2023-12-13 ENCOUNTER — Other Ambulatory Visit: Payer: Self-pay | Admitting: Internal Medicine

## 2024-01-12 ENCOUNTER — Ambulatory Visit: Payer: 59 | Admitting: Internal Medicine

## 2024-01-12 ENCOUNTER — Encounter: Payer: Self-pay | Admitting: Internal Medicine

## 2024-01-12 VITALS — BP 104/80 | HR 76 | Temp 98.1°F | Ht 64.75 in | Wt 249.6 lb

## 2024-01-12 DIAGNOSIS — Z0184 Encounter for antibody response examination: Secondary | ICD-10-CM

## 2024-01-12 DIAGNOSIS — Z23 Encounter for immunization: Secondary | ICD-10-CM

## 2024-01-12 DIAGNOSIS — I10 Essential (primary) hypertension: Secondary | ICD-10-CM | POA: Diagnosis not present

## 2024-01-12 DIAGNOSIS — Z79899 Other long term (current) drug therapy: Secondary | ICD-10-CM

## 2024-01-12 DIAGNOSIS — D75839 Thrombocytosis, unspecified: Secondary | ICD-10-CM | POA: Diagnosis not present

## 2024-01-12 LAB — CBC WITH DIFFERENTIAL/PLATELET
Basophils Absolute: 0.1 10*3/uL (ref 0.0–0.1)
Basophils Relative: 1 % (ref 0.0–3.0)
Eosinophils Absolute: 0.3 10*3/uL (ref 0.0–0.7)
Eosinophils Relative: 4.8 % (ref 0.0–5.0)
HCT: 40.8 % (ref 36.0–46.0)
Hemoglobin: 13.4 g/dL (ref 12.0–15.0)
Lymphocytes Relative: 20.7 % (ref 12.0–46.0)
Lymphs Abs: 1.2 10*3/uL (ref 0.7–4.0)
MCHC: 33 g/dL (ref 30.0–36.0)
MCV: 82.9 fl (ref 78.0–100.0)
Monocytes Absolute: 0.4 10*3/uL (ref 0.1–1.0)
Monocytes Relative: 6.7 % (ref 3.0–12.0)
Neutro Abs: 4 10*3/uL (ref 1.4–7.7)
Neutrophils Relative %: 66.8 % (ref 43.0–77.0)
Platelets: 458 10*3/uL — ABNORMAL HIGH (ref 150.0–400.0)
RBC: 4.92 Mil/uL (ref 3.87–5.11)
RDW: 14.9 % (ref 11.5–15.5)
WBC: 6 10*3/uL (ref 4.0–10.5)

## 2024-01-12 LAB — C-REACTIVE PROTEIN: CRP: <1 mg/dL (ref 0.5–20.0)

## 2024-01-12 MED ORDER — ESCITALOPRAM OXALATE 10 MG PO TABS: 10.0000 mg | ORAL_TABLET | Freq: Every day | ORAL | 3 refills | Status: AC

## 2024-01-12 MED ORDER — LOSARTAN POTASSIUM-HCTZ 50-12.5 MG PO TABS: 1.0000 | ORAL_TABLET | Freq: Every day | ORAL | 3 refills | Status: AC

## 2024-01-12 MED ORDER — METOPROLOL SUCCINATE ER 50 MG PO TB24: ORAL_TABLET | ORAL | 2 refills | Status: AC

## 2024-01-12 NOTE — Patient Instructions (Addendum)
 Good to see you today  BP is good   In future we could consider  dec the metoprolol  to 25 mg per day if controls the svt.   Fu platelet count and iron level today . Continue lifestyle intervention healthy eating and exercise . Attention as possible .   IF all stable then  cpe in 6 months with lab pre visit if you wish .  Will refill lexapro  today .

## 2024-01-12 NOTE — Progress Notes (Signed)
 Chief Complaint  Patient presents with   Medical Management of Chronic Issues    Pt is here for her 6 months f/u. She reports she is stressful this past weekend, taking care of dad in [place] had a fall.     HPI: Danielle Martin 63 y.o. come in for Chronic disease management   Father broke arm after fall. Surgery  and then hh hernia  in hospital .in [place]    6 hour drive  Ran out of lexapro  .about a month ago would like to  go back on   More short  Need refill bp med and metoprolol  . Had done some healthy lsi before and felt pretty well   walking  attention to d and e.   Past hx of iron infusion and has elevated platelet count  . No bleeding  Asks about measles vaccine   because of virth date   went to school in [place] not sure which vaccine she got and how many .   ROS: See pertinent positives and negatives per HPI.  Past Medical History:  Diagnosis Date   Allergic rhinitis    Worse in winter , grasses   Asthma    Worse in winter   Gall stone pancreatitis    lap choley 1 2017   GERD (gastroesophageal reflux disease)    Headache(784.0)    "bi-weekly" (09/03/2015)   HEMORRHAGIC CYSTITIS 05/25/2009   Qualifier: Diagnosis of  By: Ethel Henry MD, Joaquim Muir    Hypertension    Iron deficiency anemia 05/01/2015   Migraine    "q couple months" (09/03/2015)   Pneumonia    "a few times" (09/03/2015)   SVT (supraventricular tachycardia) (HCC)    No longer a problem    Family History  Problem Relation Age of Onset   Other Mother        Died of asphyxiation   Prostate cancer Father    Breast cancer Maternal Grandmother 42   BRCA 1/2 Neg Hx     Social History   Socioeconomic History   Marital status: Married    Spouse name: Not on file   Number of children: Not on file   Years of education: Not on file   Highest education level: 12th grade  Occupational History   Not on file  Tobacco Use   Smoking status: Never   Smokeless tobacco: Never   Tobacco comments:    "stopped  smoking in the 1980s"  Vaping Use   Vaping status: Never Used  Substance and Sexual Activity   Alcohol use: Yes    Alcohol/week: 8.0 standard drinks of alcohol    Types: 4 Glasses of wine, 4 Shots of liquor per week   Drug use: No   Sexual activity: Yes  Other Topics Concern   Not on file  Social History Narrative   Married   Employed [employer]  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    HH of 2    Husband has a Librarian, academic      No ets   Drinks milk and women's MV   Soc etoh only rare no tob ets   Social Drivers of Corporate investment banker Strain: Low Risk  (01/08/2024)   Overall Financial Resource Strain (CARDIA)    Difficulty of Paying Living Expenses: Not hard at all  Food Insecurity: No Food Insecurity (01/08/2024)   Hunger Vital Sign    Worried About Running Out of Food in  the Last Year: Never true    Ran Out of Food in the Last Year: Never true  Transportation Needs: No Transportation Needs (01/08/2024)   PRAPARE - Administrator, Civil Service (Medical): No    Lack of Transportation (Non-Medical): No  Physical Activity: Insufficiently Active (01/08/2024)   Exercise Vital Sign    Days of Exercise per Week: 2 days    Minutes of Exercise per Session: 20 min  Stress: No Stress Concern Present (01/08/2024)   [employer] of Occupational Health - Occupational Stress Questionnaire    Feeling of Stress : Only a little  Social Connections: Moderately Isolated (01/08/2024)   Social Connection and Isolation Panel [NHANES]    Frequency of Communication with Friends and Family: More than three times a week    Frequency of Social Gatherings with Friends and Family: Once a week    Attends Religious Services: Never    Database administrator or Organizations: No    Attends Engineer, structural: Not on file    Marital Status: Married    Outpatient Medications Prior to Visit  Medication Sig Dispense Refill   albuterol  (VENTOLIN  HFA)  108 (90 Base) MCG/ACT inhaler INHALE 2 PUFFS INTO THE LUNGS EVERY 6 HOURS AS NEEDED FOR WHEEZE 8.5 each 1   Calcium Carbonate-Vitamin D 600-400 MG-UNIT per chew tablet Chew 1 tablet by mouth daily.     CINNAMON PO Take 1,000 Units by mouth every morning.     LORazepam  (ATIVAN ) 0.5 MG tablet 1 po if needed,  Up to  Every  8 hours  For anxiety 20 tablet 0   Multiple Vitamin (MULTI-VITAMIN PO) Take 1 tablet by mouth daily.      vitamin E 400 UNIT capsule Take 400 Units by mouth daily.     escitalopram  (LEXAPRO ) 10 MG tablet TAKE 1 TABLET BY MOUTH EVERY DAY 30 tablet 2   losartan -hydrochlorothiazide  (HYZAAR) 50-12.5 MG tablet TAKE 1 TABLET DAILY 90 tablet 0   metoprolol  succinate (TOPROL -XL) 50 MG 24 hr tablet TAKE 1 TABLET DAILY WITH OR IMMEDIATELY FOLLOWING A MEAL 30 tablet 2   No facility-administered medications prior to visit.     EXAM:  BP 104/80 (BP Location: Left Arm, Patient Position: Sitting, Cuff Size: Large)   Pulse 76   Temp 98.1 F (36.7 C) (Oral)   Ht 5' 4.75" (1.645 m)   Wt 249 lb 9.6 oz (113.2 kg)   SpO2 98%   BMI 41.86 kg/m   Body mass index is 41.86 kg/m.  GENERAL: vitals reviewed and listed above, alert, oriented, appears well hydrated and in no acute distress HEENT: atraumatic, conjunctiva  clear, no obvious abnormalities on inspection of external nose and ears LUNGS: clear to auscultation bilaterally, no wheezes, rales or rhonchi, good air movement CV: HRRR, no clubbing cyanosis or  peripheral edema nl cap refill  MS: moves all extremities without noticeable focal  abnormality PSYCH: pleasant and cooperative, no obvious depression or anxiety Lab Results  Component Value Date   WBC 7.6 07/22/2023   HGB 14.1 07/22/2023   HCT 42.4 07/22/2023   PLT 500.0 (H) 07/22/2023   GLUCOSE 86 07/22/2023   CHOL 213 (H) 07/22/2023   TRIG 218.0 (H) 07/22/2023   HDL 41.40 07/22/2023   LDLDIRECT 115.0 07/23/2018   LDLCALC 128 (H) 07/22/2023   ALT 17 07/22/2023   AST 20  07/22/2023   NA 137 07/22/2023   K 4.1 07/22/2023   CL 97 07/22/2023   CREATININE 0.60  07/22/2023   BUN 26 (H) 07/22/2023   CO2 33 (H) 07/22/2023   TSH 0.96 07/22/2023   INR 1.0 01/24/2009   HGBA1C 5.9 07/22/2023   BP Readings from Last 3 Encounters:  01/12/24 104/80  07/22/23 104/80  04/23/22 122/80    ASSESSMENT AND PLAN:  Discussed the following assessment and plan:  Primary hypertension - Plan: CBC with Differential/Platelet, Iron, TIBC and Ferritin Panel, C-reactive protein  Medication management - Plan: CBC with Differential/Platelet, Iron, TIBC and Ferritin Panel, C-reactive protein, Rubeola antibody IgG (Measles Immune status)  Thrombocytosis - Plan: CBC with Differential/Platelet, Iron, TIBC and Ferritin Panel, C-reactive protein, Rubeola antibody IgG (Measles Immune status)  Immunity status testing - Plan: Rubeola antibody IgG (Measles Immune status)  Need for shingles vaccine - Plan: Zoster Recombinant (Shingrix  ) 6 mos cpe and can call ahead for lab pre visit  -Patient advised to return or notify health care team  if  new concerns arise.  Patient Instructions  Good to see you today  BP is good   In future we could consider  dec the metoprolol  to 25 mg per day if controls the svt.   Fu platelet count and iron level today . Continue lifestyle intervention healthy eating and exercise . Attention as possible .   IF all stable then  cpe in 6 months with lab pre visit if you wish .  Will refill lexapro  today . Caine Barfield K. Adie Vilar M.D.

## 2024-01-14 ENCOUNTER — Ambulatory Visit: Payer: Self-pay | Admitting: Internal Medicine

## 2024-01-14 LAB — IRON,TIBC AND FERRITIN PANEL
%SAT: 16 % (ref 16–45)
Ferritin: 29 ng/mL (ref 16–232)
Iron: 54 ug/dL (ref 45–160)
TIBC: 348 ug/dL (ref 250–450)

## 2024-01-14 LAB — RUBEOLA ANTIBODY IGG: Rubeola IgG: >300 [AU]/ml

## 2024-01-14 NOTE — Progress Notes (Signed)
 Serology shows that you are immune  have good antibodies to measles

## 2024-01-14 NOTE — Progress Notes (Signed)
 Platelet a bit better  b iron level low borderline  no anemia  Neg inflammation marker  Waiting on measles  immunity testing  Plan 6 mos check

## 2024-01-18 ENCOUNTER — Other Ambulatory Visit: Payer: Self-pay | Admitting: Internal Medicine

## 2024-02-10 DIAGNOSIS — Z809 Family history of malignant neoplasm, unspecified: Secondary | ICD-10-CM | POA: Diagnosis not present

## 2024-02-10 DIAGNOSIS — E785 Hyperlipidemia, unspecified: Secondary | ICD-10-CM | POA: Diagnosis not present

## 2024-02-10 DIAGNOSIS — Z6841 Body Mass Index (BMI) 40.0 and over, adult: Secondary | ICD-10-CM | POA: Diagnosis not present

## 2024-02-10 DIAGNOSIS — D75839 Thrombocytosis, unspecified: Secondary | ICD-10-CM | POA: Diagnosis not present

## 2024-02-10 DIAGNOSIS — J45909 Unspecified asthma, uncomplicated: Secondary | ICD-10-CM | POA: Diagnosis not present

## 2024-02-10 DIAGNOSIS — F325 Major depressive disorder, single episode, in full remission: Secondary | ICD-10-CM | POA: Diagnosis not present

## 2024-02-10 DIAGNOSIS — I10 Essential (primary) hypertension: Secondary | ICD-10-CM | POA: Diagnosis not present

## 2024-03-04 ENCOUNTER — Other Ambulatory Visit: Payer: Self-pay | Admitting: Internal Medicine
